# Patient Record
Sex: Female | Born: 1975 | Race: White | Hispanic: No | Marital: Married | Smoking: Never smoker
Health system: Southern US, Community
[De-identification: ages and names within clinical notes are randomized; demographics above are authoritative.]

## PROBLEM LIST (undated history)

## (undated) DIAGNOSIS — R1115 Cyclical vomiting syndrome unrelated to migraine: Secondary | ICD-10-CM

## (undated) DIAGNOSIS — K219 Gastro-esophageal reflux disease without esophagitis: Secondary | ICD-10-CM

## (undated) DIAGNOSIS — I10 Essential (primary) hypertension: Secondary | ICD-10-CM

## (undated) DIAGNOSIS — G4730 Sleep apnea, unspecified: Secondary | ICD-10-CM

## (undated) DIAGNOSIS — T7840XA Allergy, unspecified, initial encounter: Secondary | ICD-10-CM

## (undated) DIAGNOSIS — D649 Anemia, unspecified: Secondary | ICD-10-CM

## (undated) DIAGNOSIS — E785 Hyperlipidemia, unspecified: Secondary | ICD-10-CM

## (undated) DIAGNOSIS — U071 COVID-19: Secondary | ICD-10-CM

## (undated) HISTORY — DX: Anemia, unspecified: D64.9

## (undated) HISTORY — DX: Allergy, unspecified, initial encounter: T78.40XA

## (undated) HISTORY — DX: Essential (primary) hypertension: I10

## (undated) HISTORY — DX: Sleep apnea, unspecified: G47.30

## (undated) HISTORY — PX: TUBAL LIGATION: SHX77

## (undated) HISTORY — DX: Hyperlipidemia, unspecified: E78.5

## (undated) HISTORY — DX: Gastro-esophageal reflux disease without esophagitis: K21.9

## (undated) HISTORY — PX: CHOLECYSTECTOMY: SHX55

---

## 2019-11-20 ENCOUNTER — Emergency Department (HOSPITAL_COMMUNITY): Payer: 59

## 2019-11-20 ENCOUNTER — Inpatient Hospital Stay (HOSPITAL_COMMUNITY)
Admission: EM | Admit: 2019-11-20 | Discharge: 2019-11-23 | DRG: 177 | Disposition: A | Discharge status: Home / Self Care | Payer: 59 | Attending: Internal Medicine | Admitting: Internal Medicine

## 2019-11-20 DIAGNOSIS — U071 COVID-19: Principal | ICD-10-CM | POA: Diagnosis present

## 2019-11-20 DIAGNOSIS — D649 Anemia, unspecified: Secondary | ICD-10-CM | POA: Diagnosis present

## 2019-11-20 DIAGNOSIS — Z9049 Acquired absence of other specified parts of digestive tract: Secondary | ICD-10-CM

## 2019-11-20 DIAGNOSIS — D72819 Decreased white blood cell count, unspecified: Secondary | ICD-10-CM | POA: Diagnosis present

## 2019-11-20 DIAGNOSIS — R55 Syncope and collapse: Secondary | ICD-10-CM | POA: Diagnosis not present

## 2019-11-20 DIAGNOSIS — E876 Hypokalemia: Secondary | ICD-10-CM | POA: Diagnosis present

## 2019-11-20 DIAGNOSIS — R112 Nausea with vomiting, unspecified: Secondary | ICD-10-CM

## 2019-11-20 DIAGNOSIS — J1282 Pneumonia due to coronavirus disease 2019: Secondary | ICD-10-CM | POA: Diagnosis present

## 2019-11-20 DIAGNOSIS — E860 Dehydration: Secondary | ICD-10-CM | POA: Diagnosis present

## 2019-11-20 LAB — CBC WITH DIFFERENTIAL/PLATELET
Abs Immature Granulocytes: 0.01 10*3/uL (ref 0.00–0.07)
Basophils Absolute: 0 10*3/uL (ref 0.0–0.1)
Basophils Relative: 0 %
Eosinophils Absolute: 0 10*3/uL (ref 0.0–0.5)
Eosinophils Relative: 0 %
HCT: 36.7 % (ref 36.0–46.0)
Hemoglobin: 11.8 g/dL — ABNORMAL LOW (ref 12.0–15.0)
Immature Granulocytes: 0 %
Lymphocytes Relative: 20 %
Lymphs Abs: 0.6 10*3/uL — ABNORMAL LOW (ref 0.7–4.0)
MCH: 25.4 pg — ABNORMAL LOW (ref 26.0–34.0)
MCHC: 32.2 g/dL (ref 30.0–36.0)
MCV: 78.9 fL — ABNORMAL LOW (ref 80.0–100.0)
Monocytes Absolute: 0.1 10*3/uL (ref 0.1–1.0)
Monocytes Relative: 4 %
Neutro Abs: 2.2 10*3/uL (ref 1.7–7.7)
Neutrophils Relative %: 76 %
Platelets: 161 10*3/uL (ref 150–400)
RBC: 4.65 MIL/uL (ref 3.87–5.11)
RDW: 15 % (ref 11.5–15.5)
WBC: 2.9 10*3/uL — ABNORMAL LOW (ref 4.0–10.5)
nRBC: 0 % (ref 0.0–0.2)

## 2019-11-20 LAB — RESPIRATORY PANEL BY RT PCR (FLU A&B, COVID)
Influenza A by PCR: NEGATIVE
Influenza B by PCR: NEGATIVE
SARS Coronavirus 2 by RT PCR: POSITIVE — AB

## 2019-11-20 LAB — COMPREHENSIVE METABOLIC PANEL
ALT: 97 U/L — ABNORMAL HIGH (ref 0–44)
AST: 101 U/L — ABNORMAL HIGH (ref 15–41)
Albumin: 3.4 g/dL — ABNORMAL LOW (ref 3.5–5.0)
Alkaline Phosphatase: 89 U/L (ref 38–126)
Anion gap: 11 (ref 5–15)
BUN: 10 mg/dL (ref 6–20)
CO2: 25 mmol/L (ref 22–32)
Calcium: 8.7 mg/dL — ABNORMAL LOW (ref 8.9–10.3)
Chloride: 104 mmol/L (ref 98–111)
Creatinine, Ser: 0.69 mg/dL (ref 0.44–1.00)
GFR calc Af Amer: >60 mL/min (ref >60)
GFR calc non Af Amer: >60 mL/min (ref >60)
Glucose, Bld: 122 mg/dL — ABNORMAL HIGH (ref 70–99)
Potassium: 3.2 mmol/L — ABNORMAL LOW (ref 3.5–5.1)
Sodium: 140 mmol/L (ref 135–145)
Total Bilirubin: 0.9 mg/dL (ref 0.3–1.2)
Total Protein: 6.9 g/dL (ref 6.5–8.1)

## 2019-11-20 MED ORDER — SODIUM CHLORIDE 0.9 % IV SOLN: INTRAVENOUS | Status: DC

## 2019-11-20 MED ORDER — ONDANSETRON HCL 4 MG/2ML IJ SOLN
4.0000 mg | Freq: Once | INTRAMUSCULAR | Status: AC
  Administered 2019-11-20: 4 mg via INTRAVENOUS
  Filled 2019-11-20: qty 2

## 2019-11-20 NOTE — ED Provider Notes (Signed)
[facility] EMERGENCY DEPARTMENT Provider Note   CSN: [id number] Arrival date & time: 11/20/19  2205     History Chief Complaint  Patient presents with  . Covid Positive    Judith Bentley is a 44 y.o. female.  HPI   This patient is a 44 year old female, arrives by paramedic transport ill-appearing after having a unwitnessed loss of consciousness at her house.  According to the paramedics the husband had reported that the patient was found unresponsive on the floor the bathroom, it is unclear exactly how long she was there, she cannot give me any other history as she is severely diffusely weak with altered mental status.  The patient reportedly had called out for her husband who was able to hear her and found her lethargic after being diagnosed with COVID-19 on Wednesday, approximately 48 hours ago.  Her symptoms have been primarily gastrointestinal with nausea and vomiting and has been developing some lower abdominal discomfort, there has been some coughing but not as much as the gastrointestinal symptoms.  It is unclear whether the patient was vaccinated, I do not have contact information for the husband at this time.  Level 5 caveat applies secondary to the lethargic nature of this patient and inability to give a clear history.  Paramedics to report that they had some transient hypoxia requiring 3 L by nasal cannula prehospital.  Husband has Covid as well.  Husband got sick 9 days ago - was in bed for days - She has been sick since Monday (5 days), she was fatigued / no fevers.  She was having chills.  She has chronic nausea (on phenergan),   No past medical history on file.  There are no problems to display for this patient. Husband states she takes medicine for GERD  - Nexium. Taking 40mg  daily. She sees a GI doctor in [place].  No alcohol No tobacco GB has been taken out. Endoscopy showed nothing diagnostic. She has passed out with the vomiting in the  past. She can usually sleep it off.   The histories are not reviewed yet. Please review them in the "History" navigator section and refresh this SmartLink.   OB History   No obstetric history on file.     No family history on file.  Social History   Tobacco Use  . Smoking status: Not on file  Substance Use Topics  . Alcohol use: Not on file  . Drug use: Not on file    Home Medications Prior to Admission medications   Not on File    Allergies    Patient has no allergy information on record.  Review of Systems   Review of Systems  Unable to perform ROS: Mental status change    Physical Exam Updated Vital Signs BP 120/73 (BP Location: Left Arm)   Pulse 98   Temp 97.7 F (36.5 C) (Oral)   Resp 11   SpO2 93%   Physical Exam Vitals and nursing note reviewed.  Constitutional:      General: She is in acute distress.     Appearance: She is well-developed. She is ill-appearing and diaphoretic.  HENT:     Head: Normocephalic and atraumatic.     Mouth/Throat:     Pharynx: No oropharyngeal exudate.  Eyes:     General: No scleral icterus.       Right eye: No discharge.        Left eye: No discharge.     Conjunctiva/sclera: Conjunctivae normal.  Pupils: Pupils are equal, round, and reactive to light.  Neck:     Thyroid: No thyromegaly.     Vascular: No JVD.  Cardiovascular:     Rate and Rhythm: Normal rate and regular rhythm.     Heart sounds: Normal heart sounds. No murmur heard.  No friction rub. No gallop.   Pulmonary:     Effort: Pulmonary effort is normal. No respiratory distress.     Breath sounds: Normal breath sounds. No wheezing or rales.  Abdominal:     General: Bowel sounds are normal. There is no distension.     Palpations: Abdomen is soft. There is no mass.     Tenderness: There is abdominal tenderness.     Comments: There is tenderness in the lower abdomen and left lower quadrant but very soft nonperitoneal with no guarding  Musculoskeletal:         General: No tenderness. Normal range of motion.     Cervical back: Normal range of motion and neck supple.  Lymphadenopathy:     Cervical: No cervical adenopathy.  Skin:    General: Skin is warm.     Findings: No erythema or rash.  Neurological:     Mental Status: She is alert.     Coordination: Coordination normal.  Psychiatric:        Behavior: Behavior normal.     ED Results / Procedures / Treatments   Labs (all labs ordered are listed, but only abnormal results are displayed) Labs Reviewed  RESPIRATORY PANEL BY RT PCR (FLU A&B, COVID)  CBC WITH DIFFERENTIAL/PLATELET  COMPREHENSIVE METABOLIC PANEL  URINALYSIS, ROUTINE W REFLEX MICROSCOPIC    EKG None  Radiology No results found.  Procedures Procedures (including critical care time)  Medications Ordered in ED Medications  ondansetron (ZOFRAN) injection 4 mg (has no administration in time range)  0.9 %  sodium chloride infusion (has no administration in time range)    ED Course  I have reviewed the triage vital signs and the nursing notes.  Pertinent labs & imaging results that were available during my care of the patient were reviewed by me and considered in my medical decision making (see chart for details).    MDM Rules/Calculators/A&P                          This patient is so diffusely weak that she needs 2 people to help her even sit up in the bed.  She has some very strange eye flickering movements but seems to be awake and trying to follow commands during this timeframe.  When I asked simple yes/no questions she is able to nod or shake her head but really does not talk at all.  She tries to whisper certain things and when I ask you to call she whispers my husband.  She cannot give me his contact information and it is not listed in the demographics section.  EKG performed on November 20, 2019 at 10:14 PM shows borderline sinus tachycardia with a normal axis, normal intervals, normal ST segments, no  signs of left ventricular hypertrophy, if anything there is some low voltage.  We will proceed with evaluation of this patient with regards to gastrointestinal symptoms, a chest x-ray, rule out urinary tract infection or other intra-abdominal pathology that may be causing her symptoms.  The patient is altered, needs further history and stabilizing care - Covid 19  precautions maintained in the room.  Husband contacted by phone -  she has had Covid 19 - having "problems with her stomach" = had endoscopy a month ago due to chronic abd problems.  She gets to vomiting, the last time she was in the hospital - she had a "cocktail" to help her-  Taking mylanta - then developed a fever - stomach was hurting tonight - husband her her scream "help me", was not breathing - he gave her mouth  See above notes for husbands report. Specifially, this patient has had syncopal / weak episodes episodically with the n/v in the past.  Husbands number is [phone]  At change of shift - care signed out to Dr. Joycelyn Das was evaluated in Emergency Department on 11/20/2019 for the symptoms described in the history of present illness. She was evaluated in the context of the global COVID-19 pandemic, which necessitated consideration that the patient might be at risk for infection with the SARS-CoV-2 virus that causes COVID-19. Institutional protocols and algorithms that pertain to the evaluation of patients at risk for COVID-19 are in a state of rapid change based on information released by regulatory bodies including the CDC and federal and state organizations. These policies and algorithms were followed during the patient's care in the ED.   Final Clinical Impression(s) / ED Diagnoses Final diagnoses:  None    Rx / DC Orders ED Discharge Orders    None       Eber Hong, MD 11/20/19 2323

## 2019-11-20 NOTE — ED Triage Notes (Signed)
Per ems pt tested positive for covid on Wednesday. Since then she has been having worsening symptoms.pt has been c/o N/V and L Lower abdominal pain. Pt also had an unwitnessed syncopal episode per husband where he found her on the bathroom floor after calling his name.  Pt lethargic with ems and upon arrival. Patient unable to answer any questions at this time.

## 2019-11-21 ENCOUNTER — Observation Stay (HOSPITAL_COMMUNITY): Payer: 59

## 2019-11-21 ENCOUNTER — Encounter (HOSPITAL_COMMUNITY): Payer: Self-pay | Admitting: Internal Medicine

## 2019-11-21 ENCOUNTER — Other Ambulatory Visit: Payer: Self-pay

## 2019-11-21 DIAGNOSIS — R55 Syncope and collapse: Secondary | ICD-10-CM

## 2019-11-21 DIAGNOSIS — J1282 Pneumonia due to coronavirus disease 2019: Secondary | ICD-10-CM | POA: Diagnosis present

## 2019-11-21 DIAGNOSIS — D649 Anemia, unspecified: Secondary | ICD-10-CM | POA: Diagnosis present

## 2019-11-21 DIAGNOSIS — U071 COVID-19: Secondary | ICD-10-CM | POA: Diagnosis present

## 2019-11-21 DIAGNOSIS — Z9049 Acquired absence of other specified parts of digestive tract: Secondary | ICD-10-CM | POA: Diagnosis not present

## 2019-11-21 DIAGNOSIS — E860 Dehydration: Secondary | ICD-10-CM | POA: Diagnosis present

## 2019-11-21 DIAGNOSIS — E876 Hypokalemia: Secondary | ICD-10-CM | POA: Diagnosis present

## 2019-11-21 DIAGNOSIS — D72819 Decreased white blood cell count, unspecified: Secondary | ICD-10-CM | POA: Diagnosis present

## 2019-11-21 HISTORY — DX: COVID-19: U07.1

## 2019-11-21 HISTORY — DX: Syncope and collapse: R55

## 2019-11-21 LAB — CBC
HCT: 33.5 % — ABNORMAL LOW (ref 36.0–46.0)
Hemoglobin: 10.3 g/dL — ABNORMAL LOW (ref 12.0–15.0)
MCH: 24.6 pg — ABNORMAL LOW (ref 26.0–34.0)
MCHC: 30.7 g/dL (ref 30.0–36.0)
MCV: 80.1 fL (ref 80.0–100.0)
Platelets: 154 10*3/uL (ref 150–400)
RBC: 4.18 MIL/uL (ref 3.87–5.11)
RDW: 15.3 % (ref 11.5–15.5)
WBC: 2 10*3/uL — ABNORMAL LOW (ref 4.0–10.5)
nRBC: 0 % (ref 0.0–0.2)

## 2019-11-21 LAB — C-REACTIVE PROTEIN: CRP: 1.9 mg/dL — ABNORMAL HIGH (ref <1.0)

## 2019-11-21 LAB — BASIC METABOLIC PANEL
Anion gap: 9 (ref 5–15)
BUN: 7 mg/dL (ref 6–20)
CO2: 27 mmol/L (ref 22–32)
Calcium: 7.9 mg/dL — ABNORMAL LOW (ref 8.9–10.3)
Chloride: 107 mmol/L (ref 98–111)
Creatinine, Ser: 0.58 mg/dL (ref 0.44–1.00)
GFR calc Af Amer: >60 mL/min (ref >60)
GFR calc non Af Amer: >60 mL/min (ref >60)
Glucose, Bld: 97 mg/dL (ref 70–99)
Potassium: 3.5 mmol/L (ref 3.5–5.1)
Sodium: 143 mmol/L (ref 135–145)

## 2019-11-21 LAB — BRAIN NATRIURETIC PEPTIDE: B Natriuretic Peptide: 39.1 pg/mL (ref 0.0–100.0)

## 2019-11-21 LAB — HIV ANTIBODY (ROUTINE TESTING W REFLEX): HIV Screen 4th Generation wRfx: NONREACTIVE

## 2019-11-21 LAB — PREGNANCY, URINE: Preg Test, Ur: NEGATIVE

## 2019-11-21 LAB — CREATININE, SERUM
Creatinine, Ser: 0.62 mg/dL (ref 0.44–1.00)
GFR calc Af Amer: >60 mL/min (ref >60)
GFR calc non Af Amer: >60 mL/min (ref >60)

## 2019-11-21 LAB — URINALYSIS, ROUTINE W REFLEX MICROSCOPIC
Bilirubin Urine: NEGATIVE
Glucose, UA: NEGATIVE mg/dL
Hgb urine dipstick: NEGATIVE
Ketones, ur: NEGATIVE mg/dL
Leukocytes,Ua: NEGATIVE
Nitrite: NEGATIVE
Protein, ur: NEGATIVE mg/dL
Specific Gravity, Urine: 1.012 (ref 1.005–1.030)
pH: 5 (ref 5.0–8.0)

## 2019-11-21 LAB — MAGNESIUM: Magnesium: 2 mg/dL (ref 1.7–2.4)

## 2019-11-21 LAB — D-DIMER, QUANTITATIVE: D-Dimer, Quant: 0.61 ug/mL-FEU — ABNORMAL HIGH (ref 0.00–0.50)

## 2019-11-21 MED ORDER — SODIUM CHLORIDE 0.9 % IV BOLUS
1000.0000 mL | Freq: Once | INTRAVENOUS | Status: AC
  Administered 2019-11-21: 1000 mL via INTRAVENOUS

## 2019-11-21 MED ORDER — ACETAMINOPHEN 650 MG RE SUPP: 650.0000 mg | Freq: Four times a day (QID) | RECTAL | Status: DC | PRN

## 2019-11-21 MED ORDER — GUAIFENESIN-DM 100-10 MG/5ML PO SYRP: 10.0000 mL | ORAL_SOLUTION | ORAL | Status: DC | PRN

## 2019-11-21 MED ORDER — ACETAMINOPHEN 325 MG PO TABS
650.0000 mg | ORAL_TABLET | Freq: Four times a day (QID) | ORAL | Status: DC | PRN
  Administered 2019-11-21 – 2019-11-23 (×2): 650 mg via ORAL
  Filled 2019-11-21 (×2): qty 2

## 2019-11-21 MED ORDER — HYDROCOD POLST-CPM POLST ER 10-8 MG/5ML PO SUER: 5.0000 mL | Freq: Two times a day (BID) | ORAL | Status: DC | PRN

## 2019-11-21 MED ORDER — PROMETHAZINE HCL 25 MG/ML IJ SOLN
12.5000 mg | Freq: Once | INTRAMUSCULAR | Status: AC
  Administered 2019-11-21: 12.5 mg via INTRAVENOUS
  Filled 2019-11-21: qty 1

## 2019-11-21 MED ORDER — SODIUM CHLORIDE 0.9 % IV SOLN
200.0000 mg | Freq: Once | INTRAVENOUS | Status: AC
  Administered 2019-11-21: 200 mg via INTRAVENOUS
  Filled 2019-11-21: qty 40

## 2019-11-21 MED ORDER — ENOXAPARIN SODIUM 40 MG/0.4ML ~~LOC~~ SOLN
40.0000 mg | SUBCUTANEOUS | Status: DC
  Administered 2019-11-21 – 2019-11-23 (×3): 40 mg via SUBCUTANEOUS
  Filled 2019-11-21 (×3): qty 0.4

## 2019-11-21 MED ORDER — SODIUM CHLORIDE 0.9 % IV SOLN: INTRAVENOUS | Status: DC

## 2019-11-21 MED ORDER — ONDANSETRON HCL 4 MG/2ML IJ SOLN
4.0000 mg | Freq: Four times a day (QID) | INTRAMUSCULAR | Status: DC | PRN
  Administered 2019-11-21 – 2019-11-22 (×3): 4 mg via INTRAVENOUS
  Filled 2019-11-21 (×3): qty 2

## 2019-11-21 MED ORDER — ONDANSETRON HCL 4 MG PO TABS: 4.0000 mg | ORAL_TABLET | Freq: Four times a day (QID) | ORAL | Status: DC | PRN

## 2019-11-21 MED ORDER — POTASSIUM CHLORIDE CRYS ER 20 MEQ PO TBCR
20.0000 meq | EXTENDED_RELEASE_TABLET | Freq: Once | ORAL | Status: AC
  Administered 2019-11-21: 20 meq via ORAL
  Filled 2019-11-21: qty 1

## 2019-11-21 MED ORDER — DEXAMETHASONE SODIUM PHOSPHATE 10 MG/ML IJ SOLN
6.0000 mg | INTRAMUSCULAR | Status: DC
  Administered 2019-11-21 – 2019-11-22 (×2): 6 mg via INTRAVENOUS
  Filled 2019-11-21 (×2): qty 1

## 2019-11-21 MED ORDER — SODIUM CHLORIDE 0.9 % IV SOLN
100.0000 mg | Freq: Every day | INTRAVENOUS | Status: DC
  Administered 2019-11-22 – 2019-11-23 (×2): 100 mg via INTRAVENOUS
  Filled 2019-11-21 (×2): qty 20

## 2019-11-21 NOTE — ED Notes (Signed)
Checked oin pt she stated that she was fine but she wanted a blanket. Blanket provided

## 2019-11-21 NOTE — Plan of Care (Signed)

## 2019-11-21 NOTE — ED Notes (Signed)
Arlys John, husband, [phone] would like an update when available

## 2019-11-21 NOTE — ED Provider Notes (Signed)
Care assumed from Dr. Hyacinth Meeker at shift change.  Patient brought here following an apparent syncopal episode.  Patient diagnosed with COVID-19 5 days ago and has been feeling poorly.  This evening, she began vomiting, then had what sounds like a syncopal spell.  He was then brought here by ambulance.  I assumed care awaiting results of patient's laboratory studies and response to antiemetic medications.  When she was rechecked, she was actively vomiting and appeared uncomfortable.  Oxygen saturations in the low 90s while off oxygen.  Her chest x-ray does show findings of possible developing pneumonia.  Given the overall clinical picture, I feel as though admission is warranted.  I have spoken with Dr. Toniann Fail who will evaluate and admit.   Geoffery Lyons, MD 11/21/19 [phone]

## 2019-11-21 NOTE — ED Notes (Signed)
Pt at 89-90 moved up to 3.5 L

## 2019-11-21 NOTE — ED Notes (Signed)
Pt stated she was hot, turned down air in room, Vitals looked same

## 2019-11-21 NOTE — ED Notes (Signed)
Pt husband updated on plan of care and pt condition Pt is lethargic and but oriented. Does not appear in distress, respirations are shallow and even.  Skins is warm, dry and intact.

## 2019-11-21 NOTE — ED Notes (Signed)
Checked on Pt and she stated that the blanket helped but she was still a little cold. I explianed to her that chills are part of covid and I would continue to keep a check on her. She stated that she was fine with that

## 2019-11-21 NOTE — Progress Notes (Addendum)
PROGRESS NOTE                                                                                                                                                                                                             Patient Demographics:    Judith BjorkStephanie Bentley, is a 44 y.o. female, DOB - 1975/08/14, ZOX:[id number]  Outpatient Primary MD for the patient is Patient, No Pcp Per    LOS - 0  Admit date - 11/20/2019    Chief Complaint  Patient presents with  . Covid Positive       Brief Narrative - Judith BjorkStephanie Bentley is a 44 y.o. female with no significant past medical history was brought to the ER after patient had lost consciousness.  Patient states about 5 days ago patient started developing headache fever chills upper respiratory tract symptoms with nausea vomiting.  She was diagnosed with Covid PNA and admitted.   Subjective:    Judith BjorkStephanie Bentley today has, No headache, No chest pain, No abdominal pain - No Nausea, No new weakness tingling or numbness, mild Cough - SOB.     Assessment  & Plan :     1.  Acute Covid 19 Viral Pneumonitis during the ongoing 2020 Covid 19 Pandemic -she is unfortunately unvaccinated and so far appears to have mild parenchymal lung injury, will be started on combination of steroids and Remdesivir.  Monitor closely.  She has consented for Actemra/Baricitinib use if she gets worse.  Encouraged the patient to sit up in chair in the daytime use I-S and flutter valve for pulmonary toiletry and then prone in bed when at night.  Will advance activity and titrate down oxygen as possible.  Actemra/Baricitinib  off label use - patient was told that if COVID-19 pneumonitis gets worse we might potentially use Actemra off label, patient denies any known history of active diverticulitis, tuberculosis or hepatitis, understands the risks and benefits and wants to proceed with Actemra treatment if required.      SpO2: 95 %  Recent Labs  Lab 11/20/19 2222 11/20/19 2228 11/21/19 0615 11/21/19 0856  WBC 2.9*  --  2.0*  --   PLT 161  --  154  --   CRP  --   --  1.9*  --   BNP  --   --   --  39.1  DDIMER  --   --  0.61*  --   AST 101*  --   --   --   ALT 97*  --   --   --   ALKPHOS 89  --   --   --   BILITOT 0.9  --   --   --   ALBUMIN 3.4*  --   --   --   SARSCOV2NAA  --  POSITIVE*  --   --     2.  Generalized body aches and some intermittent flank pain.  CT scan head,  abdomen - pelvis non acute, await UA, monitor with IVF and supportive care.   3.  Mild asymptomatic transaminitis due to viral infection.  Trend.  4.  Hypokalemia.  Replaced and stable.  5.  Mild leukopenia likely due to viral illness.  Monitor with supportive care.  Repeat counts in the morning.  6.  Dehydration induced syncope.  Stabilized, symptom-free, head CT unremarkable, no focal deficits, hydrate with IV fluids and monitor with PT.    Condition - Fair  Family Communication  :  None  Code Status :  Full  Consults  :  None  Procedures  :    CT Head and ADB -Pelvis - Non acute  PUD Prophylaxis : None  Disposition Plan  :    Status is: Inpt  Dispo: The patient is from: Home              Anticipated d/c is to: Home              Anticipated d/c date is: 3 days              Patient currently is not medically stable to d/c.   DVT Prophylaxis  :  Lovenox    Lab Results  Component Value Date   PLT 154 11/21/2019    Diet :  Diet Order            Diet regular Room service appropriate? Yes; Fluid consistency: Thin  Diet effective now                  Inpatient Medications  Scheduled Meds: . dexamethasone (DECADRON) injection  6 mg Intravenous Q24H  . enoxaparin (LOVENOX) injection  40 mg Subcutaneous Q24H   Continuous Infusions: . sodium chloride 125 mL/hr at 11/21/19 0553   PRN Meds:.acetaminophen **OR** [DISCONTINUED] acetaminophen, chlorpheniramine-HYDROcodone,  guaiFENesin-dextromethorphan, [DISCONTINUED] ondansetron **OR** ondansetron (ZOFRAN) IV  Antibiotics  :    Anti-infectives (From admission, onward)   None       Time Spent in minutes  30   Susa Raring M.D on 11/21/2019 at 12:02 PM  To page go to [email or web address] - password [facility]  [facility] -  Office  [phone]    See all Orders from today for further details    Objective:   Vitals:   11/21/19 1015 11/21/19 1030 11/21/19 1045 11/21/19 1115  BP: 126/85 133/86 135/83 122/72  Pulse: 85 89 94 (!) 108  Resp: 16 15 15  (!) 30  Temp:      TempSrc:      SpO2:        Wt Readings from Last 3 Encounters:  No data found for Wt     Intake/Output Summary (Last 24 hours) at 11/21/2019 1202 Last data filed at 11/21/2019 0503 Gross per 24 hour  Intake 1000 ml  Output 450 ml  Net 550 ml     Physical Exam  Awake Alert,  No new F.N deficits, Normal affect [place].AT,PERRAL Supple Neck,No JVD, No cervical lymphadenopathy appriciated.  Symmetrical Chest wall movement, Good air movement bilaterally, CTAB RRR,No Gallops,Rubs or new Murmurs, No Parasternal Heave +ve B.Sounds, Abd Soft, No tenderness, No organomegaly appriciated, No rebound - guarding or rigidity. No Cyanosis, Clubbing or edema, No new Rash or bruise       Data Review:    CBC Recent Labs  Lab 11/20/19 2222 11/21/19 0615  WBC 2.9* 2.0*  HGB 11.8* 10.3*  HCT 36.7 33.5*  PLT 161 154  MCV 78.9* 80.1  MCH 25.4* 24.6*  MCHC 32.2 30.7  RDW 15.0 15.3  LYMPHSABS 0.6*  --   MONOABS 0.1  --   EOSABS 0.0  --   BASOSABS 0.0  --     Recent Labs  Lab 11/20/19 2222 11/21/19 0615 11/21/19 0856  NA 140  --  143  K 3.2*  --  3.5  CL 104  --  107  CO2 25  --  27  GLUCOSE 122*  --  97  BUN 10  --  7  CREATININE 0.69 0.62 0.58  CALCIUM 8.7*  --  7.9*  AST 101*  --   --   ALT 97*  --   --   ALKPHOS 89  --   --   BILITOT 0.9  --   --   ALBUMIN 3.4*  --   --   MG  --   --  2.0  CRP  --  1.9*  --     DDIMER  --  0.61*  --   BNP  --   --  39.1    ------------------------------------------------------------------------------------------------------------------ No results for input(s): CHOL, HDL, LDLCALC, TRIG, CHOLHDL, LDLDIRECT in the last 72 hours.  No results found for: HGBA1C ------------------------------------------------------------------------------------------------------------------ No results for input(s): TSH, T4TOTAL, T3FREE, THYROIDAB in the last 72 hours.  Invalid input(s): FREET3  Cardiac Enzymes No results for input(s): CKMB, TROPONINI, MYOGLOBIN in the last 168 hours.  Invalid input(s): CK ------------------------------------------------------------------------------------------------------------------    Component Value Date/Time   BNP 39.1 11/21/2019 0856    Micro Results Recent Results (from the past 240 hour(s))  Respiratory Panel by RT PCR (Flu A&B, Covid) - Nasopharyngeal Swab     Status: Abnormal   Collection Time: 11/20/19 10:28 PM   Specimen: Nasopharyngeal Swab  Result Value Ref Range Status   SARS Coronavirus 2 by RT PCR POSITIVE (A) NEGATIVE Final    Comment: RESULT CALLED TO, READ BACK BY AND VERIFIED WITH: Alyse Low RN 11/20/19 AT 2352 SK (NOTE) SARS-CoV-2 target nucleic acids are DETECTED.  SARS-CoV-2 RNA is generally detectable in upper respiratory specimens  during the acute phase of infection. Positive results are indicative of the presence of the identified virus, but do not rule out bacterial infection or co-infection with other pathogens not detected by the test. Clinical correlation with patient history and other diagnostic information is necessary to determine patient infection status. The expected result is Negative.  Fact Sheet for Patients:  https://www.moore.com/  Fact Sheet for Healthcare Providers: https://www.young.biz/  This test is not yet approved or cleared by the [place] FDA and  has been authorized for detection and/or diagnosis of SARS-CoV-2 by FDA under an Emergency Use Authorization (EUA).  This EUA will remain in effect (meaning this test can be use d) for the duration of  the COVID-19 declaration under Section 564(b)(1) of the Act, 21 U.S.C. section 360bbb-3(b)(1), unless the authorization is terminated or revoked sooner.  Influenza A by PCR NEGATIVE NEGATIVE Final   Influenza B by PCR NEGATIVE NEGATIVE Final    Comment: (NOTE) The Xpert Xpress SARS-CoV-2/FLU/RSV assay is intended as an aid in  the diagnosis of influenza from Nasopharyngeal swab specimens and  should not be used as a sole basis for treatment. Nasal washings and  aspirates are unacceptable for Xpert Xpress SARS-CoV-2/FLU/RSV  testing.  Fact Sheet for Patients: https://www.moore.com/  Fact Sheet for Healthcare Providers: https://www.young.biz/  This test is not yet approved or cleared by the [place] FDA and  has been authorized for detection and/or diagnosis of SARS-CoV-2 by  FDA under an Emergency Use Authorization (EUA). This EUA will remain  in effect (meaning this test can be used) for the duration of the  Covid-19 declaration under Section 564(b)(1) of the Act, 21  U.S.C. section 360bbb-3(b)(1), unless the authorization is  terminated or revoked. Performed at [facility], [address], [place], [place] [address]     Radiology Reports CT HEAD WO CONTRAST  Result Date: 11/21/2019 CLINICAL DATA:  Mental status change.  No reported injury. EXAM: CT HEAD WITHOUT CONTRAST TECHNIQUE: Contiguous axial images were obtained from the base of the skull through the vertex without intravenous contrast. COMPARISON:  10/05/2010 head CT. FINDINGS: Brain: No evidence of parenchymal hemorrhage or extra-axial fluid collection. No mass lesion, mass effect, or midline shift. No CT evidence of acute infarction. Cerebral volume  is age appropriate. No ventriculomegaly. Vascular: No acute abnormality. Skull: No evidence of calvarial fracture. Sinuses/Orbits: No fluid levels. Mild mucoperiosteal thickening throughout the visualized paranasal sinuses bilaterally. Other:  The mastoid air cells are unopacified. IMPRESSION: 1. No evidence of acute intracranial abnormality. 2. Mild chronic appearing paranasal sinusitis. Electronically Signed   By: Delbert Phenix M.D.   On: 11/21/2019 05:09   DG Chest Port 1 View  Result Date: 11/20/2019 CLINICAL DATA:  Cough COVID EXAM: PORTABLE CHEST 1 VIEW COMPARISON:  None. FINDINGS: Patchy basilar airspace disease. No pleural effusion. Normal heart size. No pneumothorax. IMPRESSION: Patchy basilar airspace disease suspicious for pneumonia. Electronically Signed   By: Jasmine Pang M.D.   On: 11/20/2019 22:40   CT RENAL STONE STUDY  Result Date: 11/21/2019 CLINICAL DATA:  Left lower abdominal pain, nausea and vomiting. COVID positive. Syncopal episode. EXAM: CT ABDOMEN AND PELVIS WITHOUT CONTRAST TECHNIQUE: Multidetector CT imaging of the abdomen and pelvis was performed following the standard protocol without IV contrast. COMPARISON:  09/07/2019 CT chest, abdomen and pelvis. FINDINGS: Lower chest: Patchy ground-glass opacity and peripheral consolidation at both lung bases, new. Hepatobiliary: Normal liver size. No liver mass. Cholecystectomy. No biliary ductal dilatation. Pancreas: Normal, with no mass or duct dilation. Spleen: Normal size. No mass. Adrenals/Urinary Tract: Normal adrenals. No renal stones. No hydronephrosis. No contour deforming renal masses. Normal bladder. Stomach/Bowel: Normal non-distended stomach. Normal caliber small bowel with no small bowel wall thickening. Normal appendix. Normal large bowel with no diverticulosis, large bowel wall thickening or pericolonic fat stranding. Vascular/Lymphatic: Normal caliber abdominal aorta. No pathologically enlarged lymph nodes in the abdomen or  pelvis. Reproductive: Grossly normal uterus.  No adnexal mass. Other: No pneumoperitoneum, ascites or focal fluid collection. Small fat containing umbilical hernia. Musculoskeletal: No aggressive appearing focal osseous lesions. Mild thoracolumbar spondylosis. IMPRESSION: 1. Patchy ground-glass opacity and peripheral consolidation at both lung bases, new, compatible with COVID-19 pneumonia. 2. No acute abnormality in the abdomen or pelvis. No evidence of bowel obstruction or acute bowel inflammation. Normal appendix. 3. Small fat containing umbilical  hernia. Electronically Signed   By: Delbert Phenix M.D.   On: 11/21/2019 08:20

## 2019-11-21 NOTE — H&P (Signed)
History and Physical    Judith Bentley HYW:[id number] DOB: 1975-07-26 DOA: 11/20/2019  PCP: Patient, No Pcp Per  Patient coming from: Home.  Chief Complaint: Loss of consciousness.  HPI: Judith Bentley is a 44 y.o. female with no significant past medical history was brought to the ER after patient had lost consciousness.  Patient states about 5 days ago patient started developing headache fever chills upper respiratory tract symptoms with nausea vomiting.  Since then patient has been having persistent weakness and dizziness.  Patient's husband was diagnosed with COVID-19 infection about 3 days ago.  Denies any diarrhea but has been having some left flank pain.  Patient states she was going to the bathroom and was on the commode when she lost consciousness.  Patient feels that she may have lost consciousness only for few minutes.  Denies chest pain or shortness of breath.  ED Course: In the ER patient appeared generally weak but was not hypotensive or febrile or hypoxic.  Chest x-ray shows bilateral infiltrates.  Labs show elevated AST and ALT and potassium was 3.2.  Hemoglobin 11.8 WBC 2.9 inflammatory markers are pending Covid test is positive.  CT head is unremarkable EKG shows normal sinus rhythm.  UA is pending.  Patient was started on fluids and admitted for further observation.  Review of Systems: As per HPI, rest all negative.   History reviewed. No pertinent past medical history.  Past Surgical History:  Procedure Laterality Date  . CESAREAN SECTION    . CHOLECYSTECTOMY       reports that she has never smoked. She has never used smokeless tobacco. No history on file for alcohol use and drug use.  Not on File  Family History  Problem Relation Age of Onset  . Diabetes Mellitus II Maternal Grandfather     Prior to Admission medications   Not on File    Physical Exam: Constitutional: Moderately built and nourished. Vitals:   11/21/19 0345 11/21/19 0400 11/21/19  0430 11/21/19 0445  BP: 123/88 125/90 129/89 131/89  Pulse: 89 89 91 91  Resp: (!) 21 16 13 15   Temp:  97.7 F (36.5 C)    TempSrc:  Oral    SpO2:       Eyes: Anicteric no pallor. ENMT: No discharge from the ears eyes nose or mouth. Neck: No mass felt.  No neck rigidity. Respiratory: No rhonchi or crepitations. Cardiovascular: S1-S2 heard. Abdomen: Soft mild tenderness in left flank.  No guarding or rigidity. Musculoskeletal: No edema. Skin: No rash. Neurologic: Alert awake oriented to time place and person.  Moves all extremities. Psychiatric: Appears normal.  Normal affect.   Labs on Admission: I have personally reviewed following labs and imaging studies  CBC: Recent Labs  Lab 11/20/19 2222  WBC 2.9*  NEUTROABS 2.2  HGB 11.8*  HCT 36.7  MCV 78.9*  PLT 161   Basic Metabolic Panel: Recent Labs  Lab 11/20/19 2222  NA 140  K 3.2*  CL 104  CO2 25  GLUCOSE 122*  BUN 10  CREATININE 0.69  CALCIUM 8.7*   GFR: CrCl cannot be calculated (Unknown ideal weight.). Liver Function Tests: Recent Labs  Lab 11/20/19 2222  AST 101*  ALT 97*  ALKPHOS 89  BILITOT 0.9  PROT 6.9  ALBUMIN 3.4*   No results for input(s): LIPASE, AMYLASE in the last 168 hours. No results for input(s): AMMONIA in the last 168 hours. Coagulation Profile: No results for input(s): INR, PROTIME in the last 168 hours. Cardiac  Enzymes: No results for input(s): CKTOTAL, CKMB, CKMBINDEX, TROPONINI in the last 168 hours. BNP (last 3 results) No results for input(s): PROBNP in the last 8760 hours. HbA1C: No results for input(s): HGBA1C in the last 72 hours. CBG: No results for input(s): GLUCAP in the last 168 hours. Lipid Profile: No results for input(s): CHOL, HDL, LDLCALC, TRIG, CHOLHDL, LDLDIRECT in the last 72 hours. Thyroid Function Tests: No results for input(s): TSH, T4TOTAL, FREET4, T3FREE, THYROIDAB in the last 72 hours. Anemia Panel: No results for input(s): VITAMINB12, FOLATE,  FERRITIN, TIBC, IRON, RETICCTPCT in the last 72 hours. Urine analysis: No results found for: COLORURINE, APPEARANCEUR, LABSPEC, PHURINE, GLUCOSEU, HGBUR, BILIRUBINUR, KETONESUR, PROTEINUR, UROBILINOGEN, NITRITE, LEUKOCYTESUR Sepsis Labs: @LABRCNTIP (procalcitonin:4,lacticidven:4) ) Recent Results (from the past 240 hour(s))  Respiratory Panel by RT PCR (Flu A&B, Covid) - Nasopharyngeal Swab     Status: Abnormal   Collection Time: 11/20/19 10:28 PM   Specimen: Nasopharyngeal Swab  Result Value Ref Range Status   SARS Coronavirus 2 by RT PCR POSITIVE (A) NEGATIVE Final    Comment: RESULT CALLED TO, READ BACK BY AND VERIFIED WITH: 01/20/20 RN 11/20/19 AT 2352 SK (NOTE) SARS-CoV-2 target nucleic acids are DETECTED.  SARS-CoV-2 RNA is generally detectable in upper respiratory specimens  during the acute phase of infection. Positive results are indicative of the presence of the identified virus, but do not rule out bacterial infection or co-infection with other pathogens not detected by the test. Clinical correlation with patient history and other diagnostic information is necessary to determine patient infection status. The expected result is Negative.  Fact Sheet for Patients:  01/20/20  Fact Sheet for Healthcare Providers: https://www.moore.com/  This test is not yet approved or cleared by the https://www.young.biz/ FDA and  has been authorized for detection and/or diagnosis of SARS-CoV-2 by FDA under an Emergency Use Authorization (EUA).  This EUA will remain in effect (meaning this test can be use d) for the duration of  the COVID-19 declaration under Section 564(b)(1) of the Act, 21 U.S.C. section 360bbb-3(b)(1), unless the authorization is terminated or revoked sooner.      Influenza A by PCR NEGATIVE NEGATIVE Final   Influenza B by PCR NEGATIVE NEGATIVE Final    Comment: (NOTE) The Xpert Xpress SARS-CoV-2/FLU/RSV assay is  intended as an aid in  the diagnosis of influenza from Nasopharyngeal swab specimens and  should not be used as a sole basis for treatment. Nasal washings and  aspirates are unacceptable for Xpert Xpress SARS-CoV-2/FLU/RSV  testing.  Fact Sheet for Patients: [place]  Fact Sheet for Healthcare Providers: https://www.moore.com/  This test is not yet approved or cleared by the https://www.young.biz/ FDA and  has been authorized for detection and/or diagnosis of SARS-CoV-2 by  FDA under an Emergency Use Authorization (EUA). This EUA will remain  in effect (meaning this test can be used) for the duration of the  Covid-19 declaration under Section 564(b)(1) of the Act, 21  U.S.C. section 360bbb-3(b)(1), unless the authorization is  terminated or revoked. Performed at [facility], [address], [place], [place]      Radiological Exams on Admission: CT HEAD WO CONTRAST  Result Date: 11/21/2019 CLINICAL DATA:  Mental status change.  No reported injury. EXAM: CT HEAD WITHOUT CONTRAST TECHNIQUE: Contiguous axial images were obtained from the base of the skull through the vertex without intravenous contrast. COMPARISON:  10/05/2010 head CT. FINDINGS: Brain: No evidence of parenchymal hemorrhage or extra-axial fluid collection. No mass lesion,  mass effect, or midline shift. No CT evidence of acute infarction. Cerebral volume is age appropriate. No ventriculomegaly. Vascular: No acute abnormality. Skull: No evidence of calvarial fracture. Sinuses/Orbits: No fluid levels. Mild mucoperiosteal thickening throughout the visualized paranasal sinuses bilaterally. Other:  The mastoid air cells are unopacified. IMPRESSION: 1. No evidence of acute intracranial abnormality. 2. Mild chronic appearing paranasal sinusitis. Electronically Signed   By: Delbert Phenix M.D.   On: 11/21/2019 05:09   DG Chest Port 1 View  Result Date: 11/20/2019 CLINICAL  DATA:  Cough COVID EXAM: PORTABLE CHEST 1 VIEW COMPARISON:  None. FINDINGS: Patchy basilar airspace disease. No pleural effusion. Normal heart size. No pneumothorax. IMPRESSION: Patchy basilar airspace disease suspicious for pneumonia. Electronically Signed   By: Jasmine Pang M.D.   On: 11/20/2019 22:40    EKG: Independently reviewed.  Normal sinus rhythm.  Assessment/Plan Principal Problem:   Syncope Active Problems:   Anemia   Pneumonia due to COVID-19 virus    1. Syncope likely from dehydration.  Patient is on fluids.  Closely monitor in telemetry.  Denies any chest pain or shortness of breath. 2. COVID-19 pneumonia patient is presently not hypoxic.  We will continue to monitor closely.  Check inflammatory markers. 3. Elevated LFTs likely from Covid infection.  Follow LFTs closely. 4. Left flank pain cause not clear mildly tender on exam.  Will check CT abdomen and pelvis and also check UA. 5. Nausea vomiting likely from Covid infection gently hydrate advance diet as tolerated. 6. Mild hypokalemia replace and recheck. 7. Ascitic normochromic anemia with mild leukopenia follow CBC.   DVT prophylaxis: Lovenox. Code Status: Full code. Family Communication: Discussed with patient. Disposition Plan: Home. Consults called: None. Admission status: Observation.   Eduard Clos MD [facility] Pager [phone].  If 7PM-7AM, please contact night-coverage [email or web address] Password [facility]  11/21/2019, 5:53 AM

## 2019-11-21 NOTE — Progress Notes (Signed)
Patient arrived to unit. Patient is on 1L [place] at 95%, eating her dinner. Room orientation provided, no additional questions. Patient is SOB but working on coughing and deep breathing. Purewick in use. Patient is stable and AO4.  Will continue to monitor.

## 2019-11-21 NOTE — ED Notes (Signed)
Moved back to 2 L [place] was at 88%

## 2019-11-22 ENCOUNTER — Inpatient Hospital Stay (HOSPITAL_COMMUNITY): Payer: 59

## 2019-11-22 LAB — CBC WITH DIFFERENTIAL/PLATELET
Abs Immature Granulocytes: 0.02 10*3/uL (ref 0.00–0.07)
Basophils Absolute: 0 10*3/uL (ref 0.0–0.1)
Basophils Relative: 0 %
Eosinophils Absolute: 0 10*3/uL (ref 0.0–0.5)
Eosinophils Relative: 0 %
HCT: 33.7 % — ABNORMAL LOW (ref 36.0–46.0)
Hemoglobin: 10.5 g/dL — ABNORMAL LOW (ref 12.0–15.0)
Immature Granulocytes: 1 %
Lymphocytes Relative: 31 %
Lymphs Abs: 0.7 10*3/uL (ref 0.7–4.0)
MCH: 25.3 pg — ABNORMAL LOW (ref 26.0–34.0)
MCHC: 31.2 g/dL (ref 30.0–36.0)
MCV: 81.2 fL (ref 80.0–100.0)
Monocytes Absolute: 0.2 10*3/uL (ref 0.1–1.0)
Monocytes Relative: 7 %
Neutro Abs: 1.4 10*3/uL — ABNORMAL LOW (ref 1.7–7.7)
Neutrophils Relative %: 61 %
Platelets: 153 10*3/uL (ref 150–400)
RBC: 4.15 MIL/uL (ref 3.87–5.11)
RDW: 15.4 % (ref 11.5–15.5)
WBC: 2.3 10*3/uL — ABNORMAL LOW (ref 4.0–10.5)
nRBC: 0 % (ref 0.0–0.2)

## 2019-11-22 LAB — COMPREHENSIVE METABOLIC PANEL
ALT: 76 U/L — ABNORMAL HIGH (ref 0–44)
AST: 45 U/L — ABNORMAL HIGH (ref 15–41)
Albumin: 2.9 g/dL — ABNORMAL LOW (ref 3.5–5.0)
Alkaline Phosphatase: 78 U/L (ref 38–126)
Anion gap: 8 (ref 5–15)
BUN: 6 mg/dL (ref 6–20)
CO2: 27 mmol/L (ref 22–32)
Calcium: 8.1 mg/dL — ABNORMAL LOW (ref 8.9–10.3)
Chloride: 107 mmol/L (ref 98–111)
Creatinine, Ser: 0.6 mg/dL (ref 0.44–1.00)
GFR calc Af Amer: >60 mL/min (ref >60)
GFR calc non Af Amer: >60 mL/min (ref >60)
Glucose, Bld: 103 mg/dL — ABNORMAL HIGH (ref 70–99)
Potassium: 3.9 mmol/L (ref 3.5–5.1)
Sodium: 142 mmol/L (ref 135–145)
Total Bilirubin: 0.4 mg/dL (ref 0.3–1.2)
Total Protein: 5.8 g/dL — ABNORMAL LOW (ref 6.5–8.1)

## 2019-11-22 LAB — URINE CULTURE: Culture: <10000 — AB

## 2019-11-22 LAB — TROPONIN I (HIGH SENSITIVITY): Troponin I (High Sensitivity): 4 ng/L (ref <18)

## 2019-11-22 LAB — LIPASE, BLOOD: Lipase: 31 U/L (ref 11–51)

## 2019-11-22 LAB — D-DIMER, QUANTITATIVE: D-Dimer, Quant: 0.53 ug/mL-FEU — ABNORMAL HIGH (ref 0.00–0.50)

## 2019-11-22 LAB — MAGNESIUM: Magnesium: 2.2 mg/dL (ref 1.7–2.4)

## 2019-11-22 LAB — C-REACTIVE PROTEIN: CRP: 1.9 mg/dL — ABNORMAL HIGH (ref <1.0)

## 2019-11-22 MED ORDER — ALUM & MAG HYDROXIDE-SIMETH 200-200-20 MG/5ML PO SUSP
30.0000 mL | Freq: Four times a day (QID) | ORAL | Status: AC
  Administered 2019-11-22 (×2): 30 mL via ORAL
  Filled 2019-11-22 (×2): qty 30

## 2019-11-22 MED ORDER — PANTOPRAZOLE SODIUM 40 MG IV SOLR
40.0000 mg | Freq: Two times a day (BID) | INTRAVENOUS | Status: DC
  Administered 2019-11-22 – 2019-11-23 (×3): 40 mg via INTRAVENOUS
  Filled 2019-11-22 (×3): qty 40

## 2019-11-22 MED ORDER — PROCHLORPERAZINE EDISYLATE 10 MG/2ML IJ SOLN
10.0000 mg | Freq: Four times a day (QID) | INTRAMUSCULAR | Status: DC | PRN
  Administered 2019-11-22 – 2019-11-23 (×2): 10 mg via INTRAVENOUS
  Filled 2019-11-22 (×3): qty 2

## 2019-11-22 MED ORDER — PROMETHAZINE HCL 25 MG RE SUPP
25.0000 mg | Freq: Four times a day (QID) | RECTAL | Status: DC | PRN
  Filled 2019-11-22: qty 1

## 2019-11-22 NOTE — Progress Notes (Addendum)
PROGRESS NOTE                                                                                                                                                                                                             Patient Demographics:    Judith Bentley, is a 44 y.o. female, DOB - 05-Sep-1975, EAV:[id number]  Outpatient Primary MD for the patient is Patient, No Pcp Per    LOS - 1  Admit date - 11/20/2019    Chief Complaint  Patient presents with  . Covid Positive       Brief Narrative - Judith Bentley is a 44 y.o. female with no significant past medical history was brought to the ER after patient had lost consciousness.  Patient states about 5 days ago patient started developing headache fever chills upper respiratory tract symptoms with nausea vomiting.  She was diagnosed with Covid PNA and admitted.   Subjective:   Patient in bed, appears comfortable, denies any headache, no fever, no chest pain or pressure, no shortness of breath , no abdominal pain but +ve nausea . No focal weakness.   Assessment  & Plan :     1.  Acute Covid 19 Viral Pneumonitis during the ongoing 2020 Covid 19 Pandemic - she is unfortunately unvaccinated and so far appears to have mild parenchymal lung injury, will be started on combination of steroids and Remdesivir.  Monitor closely.  She has consented for Actemra/Baricitinib use if she gets worse.  Encouraged the patient to sit up in chair in the daytime use I-S and flutter valve for pulmonary toiletry and then prone in bed when at night.  Will advance activity and titrate down oxygen as possible.   Recent Labs  Lab 11/20/19 2222 11/20/19 2228 11/21/19 0615 11/21/19 0856 11/22/19 0318  WBC 2.9*  --  2.0*  --  2.3*  PLT 161  --  154  --  153  CRP  --   --  1.9*  --  1.9*  BNP  --   --   --  39.1  --   DDIMER  --   --  0.61*  --  0.53*  AST 101*  --   --   --  45*  ALT 97*  --    --   --  76*  ALKPHOS 89  --   --   --  78  BILITOT 0.9  --   --   --  0.4  ALBUMIN 3.4*  --   --   --  2.9*  SARSCOV2NAA  --  POSITIVE*  --   --   --     2.  Generalized body aches and some intermittent flank pain.  CT scan head,  abdomen - pelvis non acute, stable UA, monitor with IVF and supportive care.   3.  Mild asymptomatic transaminitis due to viral infection.  Trend.  4.  Hypokalemia.  Replaced and stable.  5.  Mild leukopenia likely due to viral illness.  Monitor with supportive care.  Repeat counts in the morning.  6.  Dehydration induced syncope.  Stabilized, symptom-free, head CT unremarkable, no focal deficits, hydrated with IV fluids and monitor with PT.  7.  Nausea.  Likely due to gastritis, IV PPI, repeat KUB, soft diet and monitor.  We will also check lipase.  She also had some reproducible musculoskeletal chest discomfort for which EKG was checked and stable, will check 1 set of troponin as well.    Condition - Fair  Family Communication  : Husband [phone] over the phone on 11/22/2019  Code Status :  Full  Consults  :  None  Procedures  :    CT Head and ADB - Pelvis - Non acute  PUD Prophylaxis : None  Disposition Plan  :    Status is: Inpt  Dispo: The patient is from: Home              Anticipated d/c is to: Home              Anticipated d/c date is: 3 days              Patient currently is not medically stable to d/c.   DVT Prophylaxis  :  Lovenox    Lab Results  Component Value Date   PLT 153 11/22/2019    Diet :  Diet Order            DIET SOFT Room service appropriate? Yes; Fluid consistency: Thin  Diet effective now                  Inpatient Medications  Scheduled Meds: . alum & mag hydroxide-simeth  30 mL Oral Q6H  . dexamethasone (DECADRON) injection  6 mg Intravenous Q24H  . enoxaparin (LOVENOX) injection  40 mg Subcutaneous Q24H  . pantoprazole (PROTONIX) IV  40 mg Intravenous Q12H   Continuous Infusions: .  remdesivir 100 mg in NS 100 mL 100 mg (11/22/19 1005)   PRN Meds:.acetaminophen **OR** [DISCONTINUED] acetaminophen, chlorpheniramine-HYDROcodone, guaiFENesin-dextromethorphan, prochlorperazine, promethazine  Antibiotics  :    Anti-infectives (From admission, onward)   Start     Dose/Rate Route Frequency Ordered Stop   11/22/19 1000  remdesivir 100 mg in sodium chloride 0.9 % 100 mL IVPB       "Followed by" Linked Group Details   100 mg 200 mL/hr over 30 Minutes Intravenous Daily 11/21/19 1243 11/26/19 0959   11/21/19 1315  remdesivir 200 mg in sodium chloride 0.9% 250 mL IVPB       "Followed by" Linked Group Details   200 mg 580 mL/hr over 30 Minutes Intravenous Once 11/21/19 1243 11/21/19 1540       Time Spent in minutes  30   Susa Raring M.D on 11/22/2019 at 11:18 AM  To page go to [email or web address] - password [email or web address]  [facility] -  Office  [phone]    See all Orders from today for further details    Objective:   Vitals:   11/21/19 2204 11/22/19 0400 11/22/19 0520 11/22/19 0809  BP: 99/62  122/78 114/72  Pulse: 81   77  Resp: 18  18 20   Temp: 98.8 F (37.1 C) 98.4 F (36.9 C) 99 F (37.2 C) 99 F (37.2 C)  TempSrc: Oral Oral Oral   SpO2: 94%  96%   Weight:      Height:        Wt Readings from Last 3 Encounters:  11/21/19 113.4 kg     Intake/Output Summary (Last 24 hours) at 11/22/2019 1118 Last data filed at 11/22/2019 0800 Gross per 24 hour  Intake 2539.73 ml  Output 851 ml  Net 1688.73 ml     Physical Exam  Awake Alert, No new F.N deficits, Normal affect Christopher.AT,PERRAL Supple Neck,No JVD, No cervical lymphadenopathy appriciated.  Symmetrical Chest wall movement, Good air movement bilaterally, CTAB RRR,No Gallops, Rubs or new Murmurs, No Parasternal Heave +ve B.Sounds, Abd Soft, No tenderness, No organomegaly appriciated, No rebound - guarding or rigidity. No Cyanosis, Clubbing or edema, No new Rash or bruise      Data Review:     CBC Recent Labs  Lab 11/20/19 2222 11/21/19 0615 11/22/19 0318  WBC 2.9* 2.0* 2.3*  HGB 11.8* 10.3* 10.5*  HCT 36.7 33.5* 33.7*  PLT 161 154 153  MCV 78.9* 80.1 81.2  MCH 25.4* 24.6* 25.3*  MCHC 32.2 30.7 31.2  RDW 15.0 15.3 15.4  LYMPHSABS 0.6*  --  0.7  MONOABS 0.1  --  0.2  EOSABS 0.0  --  0.0  BASOSABS 0.0  --  0.0    Recent Labs  Lab 11/20/19 2222 11/21/19 0615 11/21/19 0856 11/22/19 0318  NA 140  --  143 142  K 3.2*  --  3.5 3.9  CL 104  --  107 107  CO2 25  --  27 27  GLUCOSE 122*  --  97 103*  BUN 10  --  7 6  CREATININE 0.69 0.62 0.58 0.60  CALCIUM 8.7*  --  7.9* 8.1*  AST 101*  --   --  45*  ALT 97*  --   --  76*  ALKPHOS 89  --   --  78  BILITOT 0.9  --   --  0.4  ALBUMIN 3.4*  --   --  2.9*  MG  --   --  2.0 2.2  CRP  --  1.9*  --  1.9*  DDIMER  --  0.61*  --  0.53*  BNP  --   --  39.1  --     ------------------------------------------------------------------------------------------------------------------ No results for input(s): CHOL, HDL, LDLCALC, TRIG, CHOLHDL, LDLDIRECT in the last 72 hours.  No results found for: HGBA1C ------------------------------------------------------------------------------------------------------------------ No results for input(s): TSH, T4TOTAL, T3FREE, THYROIDAB in the last 72 hours.  Invalid input(s): FREET3  Cardiac Enzymes No results for input(s): CKMB, TROPONINI, MYOGLOBIN in the last 168 hours.  Invalid input(s): CK ------------------------------------------------------------------------------------------------------------------    Component Value Date/Time   BNP 39.1 11/21/2019 0856    Micro Results Recent Results (from the past 240 hour(s))  Respiratory Panel by RT PCR (Flu A&B, Covid) - Nasopharyngeal Swab     Status: Abnormal   Collection Time: 11/20/19 10:28 PM   Specimen: Nasopharyngeal Swab  Result Value Ref Range Status   SARS Coronavirus 2 by RT PCR POSITIVE (A) NEGATIVE Final  Comment: RESULT CALLED TO, READ BACK BY AND VERIFIED WITH: Alyse LowS NEWSOM RN 11/20/19 AT 2352 SK (NOTE) SARS-CoV-2 target nucleic acids are DETECTED.  SARS-CoV-2 RNA is generally detectable in upper respiratory specimens  during the acute phase of infection. Positive results are indicative of the presence of the identified virus, but do not rule out bacterial infection or co-infection with other pathogens not detected by the test. Clinical correlation with patient history and other diagnostic information is necessary to determine patient infection status. The expected result is Negative.  Fact Sheet for Patients:  https://www.moore.com/https://www.fda.gov/media/142436/download  Fact Sheet for Healthcare Providers: https://www.young.biz/https://www.fda.gov/media/142435/download  This test is not yet approved or cleared by the [place] FDA and  has been authorized for detection and/or diagnosis of SARS-CoV-2 by FDA under an Emergency Use Authorization (EUA).  This EUA will remain in effect (meaning this test can be use d) for the duration of  the COVID-19 declaration under Section 564(b)(1) of the Act, 21 U.S.C. section 360bbb-3(b)(1), unless the authorization is terminated or revoked sooner.      Influenza A by PCR NEGATIVE NEGATIVE Final   Influenza B by PCR NEGATIVE NEGATIVE Final    Comment: (NOTE) The Xpert Xpress SARS-CoV-2/FLU/RSV assay is intended as an aid in  the diagnosis of influenza from Nasopharyngeal swab specimens and  should not be used as a sole basis for treatment. Nasal washings and  aspirates are unacceptable for Xpert Xpress SARS-CoV-2/FLU/RSV  testing.  Fact Sheet for Patients: https://www.moore.com/https://www.fda.gov/media/142436/download  Fact Sheet for Healthcare Providers: https://www.young.biz/https://www.fda.gov/media/142435/download  This test is not yet approved or cleared by the [place] FDA and  has been authorized for detection and/or diagnosis of SARS-CoV-2 by  FDA under an Emergency Use Authorization (EUA). This EUA  will remain  in effect (meaning this test can be used) for the duration of the  Covid-19 declaration under Section 564(b)(1) of the Act, 21  U.S.C. section 360bbb-3(b)(1), unless the authorization is  terminated or revoked. Performed at [facility], [address] [place], [place] [address]   Culture, Urine     Status: Abnormal   Collection Time: 11/21/19  7:16 AM   Specimen: Urine, Random  Result Value Ref Range Status   Specimen Description URINE, RANDOM  Final   Special Requests NONE  Final   Culture (A)  Final    <10,000 COLONIES/mL INSIGNIFICANT GROWTH Performed at [facility], [address] [place], [place] [address]    Report Status 11/22/2019 FINAL  Final    Radiology Reports CT HEAD WO CONTRAST  Result Date: 11/21/2019 CLINICAL DATA:  Mental status change.  No reported injury. EXAM: CT HEAD WITHOUT CONTRAST TECHNIQUE: Contiguous axial images were obtained from the base of the skull through the vertex without intravenous contrast. COMPARISON:  10/05/2010 head CT. FINDINGS: Brain: No evidence of parenchymal hemorrhage or extra-axial fluid collection. No mass lesion, mass effect, or midline shift. No CT evidence of acute infarction. Cerebral volume is age appropriate. No ventriculomegaly. Vascular: No acute abnormality. Skull: No evidence of calvarial fracture. Sinuses/Orbits: No fluid levels. Mild mucoperiosteal thickening throughout the visualized paranasal sinuses bilaterally. Other:  The mastoid air cells are unopacified. IMPRESSION: 1. No evidence of acute intracranial abnormality. 2. Mild chronic appearing paranasal sinusitis. Electronically Signed   By: Delbert PhenixJason A Poff M.D.   On: 11/21/2019 05:09   DG Chest Port 1 View  Result Date: 11/20/2019 CLINICAL DATA:  Cough COVID EXAM: PORTABLE CHEST 1 VIEW COMPARISON:  None. FINDINGS: Patchy basilar airspace disease. No  pleural effusion. Normal heart size. No pneumothorax. IMPRESSION: Patchy basilar airspace disease  suspicious for pneumonia. Electronically Signed   By: Jasmine Pang M.D.   On: 11/20/2019 22:40   CT RENAL STONE STUDY  Result Date: 11/21/2019 CLINICAL DATA:  Left lower abdominal pain, nausea and vomiting. COVID positive. Syncopal episode. EXAM: CT ABDOMEN AND PELVIS WITHOUT CONTRAST TECHNIQUE: Multidetector CT imaging of the abdomen and pelvis was performed following the standard protocol without IV contrast. COMPARISON:  09/07/2019 CT chest, abdomen and pelvis. FINDINGS: Lower chest: Patchy ground-glass opacity and peripheral consolidation at both lung bases, new. Hepatobiliary: Normal liver size. No liver mass. Cholecystectomy. No biliary ductal dilatation. Pancreas: Normal, with no mass or duct dilation. Spleen: Normal size. No mass. Adrenals/Urinary Tract: Normal adrenals. No renal stones. No hydronephrosis. No contour deforming renal masses. Normal bladder. Stomach/Bowel: Normal non-distended stomach. Normal caliber small bowel with no small bowel wall thickening. Normal appendix. Normal large bowel with no diverticulosis, large bowel wall thickening or pericolonic fat stranding. Vascular/Lymphatic: Normal caliber abdominal aorta. No pathologically enlarged lymph nodes in the abdomen or pelvis. Reproductive: Grossly normal uterus.  No adnexal mass. Other: No pneumoperitoneum, ascites or focal fluid collection. Small fat containing umbilical hernia. Musculoskeletal: No aggressive appearing focal osseous lesions. Mild thoracolumbar spondylosis. IMPRESSION: 1. Patchy ground-glass opacity and peripheral consolidation at both lung bases, new, compatible with COVID-19 pneumonia. 2. No acute abnormality in the abdomen or pelvis. No evidence of bowel obstruction or acute bowel inflammation. Normal appendix. 3. Small fat containing umbilical hernia. Electronically Signed   By: Delbert Phenix M.D.   On: 11/21/2019 08:20

## 2019-11-22 NOTE — Evaluation (Signed)
Physical Therapy Evaluation Patient Details Name: Judith Bentley MRN: [id number] DOB: September 26, 1975 Today's Date: 11/22/2019   History of Present Illness  Judith Bentley is a 44 y.o. female with no significant past medical history was brought to the ER after patient had lost consciousness.  Patient states about 5 days ago patient started developing headache fever chills upper respiratory tract symptoms with nausea vomiting.  Since then patient has been having persistent weakness and dizziness.  Patient's husband was diagnosed with COVID-19 infection about 3 days ago.  Clinical Impression  Pt admitted with/for covid symptoms with n/v.  Pt is independent in a confined homelike area.  Pt currently limited functionally due to the problems listed below.  (see problems list.)  Pt will benefit from PT to maximize function and safety to be able to get home safely with available assist .     Follow Up Recommendations No PT follow up    Equipment Recommendations  None recommended by PT    Recommendations for Other Services       Precautions / Restrictions        Mobility  Bed Mobility               General bed mobility comments: up walking around the room on arrival  Transfers Overall transfer level: Independent                  Ambulation/Gait Ambulation/Gait assistance: Independent Gait Distance (Feet): 50 Feet Assistive device: None Gait Pattern/deviations: WFL(Within Functional Limits)   Gait velocity interpretation: 1.31 - 2.62 ft/sec, indicative of limited community ambulator General Gait Details: walking around without issue.  Standing for long conversation.  SpO2  94% on RA.  Stairs            Wheelchair Mobility    Modified Rankin (Stroke Patients Only)       Balance Overall balance assessment: No apparent balance deficits (not formally assessed)                                           Pertinent Vitals/Pain Pain  Assessment: No/denies pain    Home Living Family/patient expects to be discharged to:: Private residence Living Arrangements: Spouse/significant other (and 3/6 children) Available Help at Discharge: Family;Available 24 hours/day Type of Home: House         Home Equipment: None      Prior Function Level of Independence: Independent               Hand Dominance        Extremity/Trunk Assessment   Upper Extremity Assessment Upper Extremity Assessment: Overall WFL for tasks assessed    Lower Extremity Assessment Lower Extremity Assessment: Overall WFL for tasks assessed       Communication   Communication: No difficulties  Cognition Arousal/Alertness: Awake/alert Behavior During Therapy: WFL for tasks assessed/performed Overall Cognitive Status: Within Functional Limits for tasks assessed                                        General Comments      Exercises     Assessment/Plan    PT Assessment Patient needs continued PT services  PT Problem List Decreased activity tolerance;Decreased mobility;Cardiopulmonary status limiting activity       PT Treatment Interventions Gait training;Functional  mobility training;Therapeutic activities;Patient/family education    PT Goals (Current goals can be found in the Care Plan section)  Acute Rehab PT Goals Patient Stated Goal: home when I can PT Goal Formulation: With patient Time For Goal Achievement: 11/29/19 Potential to Achieve Goals: Good    Frequency Min 2X/week   Barriers to discharge        Co-evaluation               AM-PAC PT "6 Clicks" Mobility  Outcome Measure Help needed turning from your back to your side while in a flat bed without using bedrails?: None Help needed moving from lying on your back to sitting on the side of a flat bed without using bedrails?: None Help needed moving to and from a bed to a chair (including a wheelchair)?: None Help needed standing up from  a chair using your arms (e.g., wheelchair or bedside chair)?: None Help needed to walk in hospital room?: None Help needed climbing 3-5 steps with a railing? : None 6 Click Score: 24    End of Session   Activity Tolerance: Patient tolerated treatment well Patient left: Other (comment);with call bell/phone within reach (up in the room) Nurse Communication: Mobility status PT Visit Diagnosis: Difficulty in walking, not elsewhere classified (R26.2)    Time: 2725-3664 PT Time Calculation (min) (ACUTE ONLY): 22 min   Charges:   PT Evaluation $PT Eval Low Complexity: 1 Low          11/22/2019  Jacinto Halim., PT Acute Rehabilitation Services [phone]  (pager) [phone]  (office)  Eliseo Gum Jhostin Epps 11/22/2019, 4:29 PM

## 2019-11-23 LAB — CBC WITH DIFFERENTIAL/PLATELET
Abs Immature Granulocytes: 0.03 10*3/uL (ref 0.00–0.07)
Basophils Absolute: 0 10*3/uL (ref 0.0–0.1)
Basophils Relative: 0 %
Eosinophils Absolute: 0 10*3/uL (ref 0.0–0.5)
Eosinophils Relative: 0 %
HCT: 33.3 % — ABNORMAL LOW (ref 36.0–46.0)
Hemoglobin: 10.2 g/dL — ABNORMAL LOW (ref 12.0–15.0)
Immature Granulocytes: 1 %
Lymphocytes Relative: 36 %
Lymphs Abs: 0.9 10*3/uL (ref 0.7–4.0)
MCH: 24.6 pg — ABNORMAL LOW (ref 26.0–34.0)
MCHC: 30.6 g/dL (ref 30.0–36.0)
MCV: 80.2 fL (ref 80.0–100.0)
Monocytes Absolute: 0.2 10*3/uL (ref 0.1–1.0)
Monocytes Relative: 8 %
Neutro Abs: 1.4 10*3/uL — ABNORMAL LOW (ref 1.7–7.7)
Neutrophils Relative %: 55 %
Platelets: 164 10*3/uL (ref 150–400)
RBC: 4.15 MIL/uL (ref 3.87–5.11)
RDW: 15.1 % (ref 11.5–15.5)
WBC: 2.6 10*3/uL — ABNORMAL LOW (ref 4.0–10.5)
nRBC: 0 % (ref 0.0–0.2)

## 2019-11-23 LAB — COMPREHENSIVE METABOLIC PANEL
ALT: 62 U/L — ABNORMAL HIGH (ref 0–44)
AST: 30 U/L (ref 15–41)
Albumin: 3 g/dL — ABNORMAL LOW (ref 3.5–5.0)
Alkaline Phosphatase: 79 U/L (ref 38–126)
Anion gap: 6 (ref 5–15)
BUN: 8 mg/dL (ref 6–20)
CO2: 32 mmol/L (ref 22–32)
Calcium: 8.5 mg/dL — ABNORMAL LOW (ref 8.9–10.3)
Chloride: 102 mmol/L (ref 98–111)
Creatinine, Ser: 0.67 mg/dL (ref 0.44–1.00)
GFR calc Af Amer: >60 mL/min (ref >60)
GFR calc non Af Amer: >60 mL/min (ref >60)
Glucose, Bld: 111 mg/dL — ABNORMAL HIGH (ref 70–99)
Potassium: 3.6 mmol/L (ref 3.5–5.1)
Sodium: 140 mmol/L (ref 135–145)
Total Bilirubin: 0.7 mg/dL (ref 0.3–1.2)
Total Protein: 5.9 g/dL — ABNORMAL LOW (ref 6.5–8.1)

## 2019-11-23 LAB — D-DIMER, QUANTITATIVE: D-Dimer, Quant: 0.6 ug/mL-FEU — ABNORMAL HIGH (ref 0.00–0.50)

## 2019-11-23 LAB — MAGNESIUM: Magnesium: 2.2 mg/dL (ref 1.7–2.4)

## 2019-11-23 LAB — C-REACTIVE PROTEIN: CRP: 1.9 mg/dL — ABNORMAL HIGH (ref <1.0)

## 2019-11-23 MED ORDER — NEXIUM 24HR 20 MG PO TBEC: 40.0000 mg | DELAYED_RELEASE_TABLET | Freq: Two times a day (BID) | ORAL | 0 refills | Status: DC

## 2019-11-23 MED ORDER — ALBUTEROL SULFATE HFA 108 (90 BASE) MCG/ACT IN AERS
2.0000 | INHALATION_SPRAY | Freq: Four times a day (QID) | RESPIRATORY_TRACT | 0 refills | Status: DC | PRN
Start: 2019-11-23 — End: 2022-06-11

## 2019-11-23 MED ORDER — PROMETHAZINE HCL 25 MG PO TABS: 25.0000 mg | ORAL_TABLET | Freq: Three times a day (TID) | ORAL | 0 refills | Status: DC | PRN

## 2019-11-23 MED ORDER — METHYLPREDNISOLONE 4 MG PO TBPK: ORAL_TABLET | ORAL | 0 refills | Status: DC

## 2019-11-23 NOTE — Discharge Instructions (Signed)
Patient scheduled for outpatient Remdesivir infusions at 11am on Tuesday 10/5 and Wednesday 10/6 at [facility]. Please inform the patient to park at [address] [place], [place], as staff will be escorting the patient through the east entrance of the hospital. Appointments take approximately 45 minutes.    There is a wave flag banner located near the entrance on [address]. Turn into this entrance and immediately turn left or right and park in 1 of the 10 designated Covid Infusion Parking spots. There is a phone number on the sign, please call and let the staff know what spot you are in and we will come out and get you. For questions call [phone].  Thanks.    Follow with Primary MD in 7 days   Get CBC, CMP, 2 view Chest X ray -  checked next visit within 1 week by Primary MD    Activity: As tolerated with Full fall precautions use walker/cane & assistance as needed  Disposition Home    Diet: Heart Healthy   Special Instructions: If you have smoked or chewed Tobacco  in the last 2 yrs please stop smoking, stop any regular Alcohol  and or any Recreational drug use.  On your next visit with your primary care physician please Get Medicines reviewed and adjusted.  Please request your Prim.MD to go over all Hospital Tests and Procedure/Radiological results at the follow up, please get all Hospital records sent to your Prim MD by signing hospital release before you go home.  If you experience worsening of your admission symptoms, develop shortness of breath, life threatening emergency, suicidal or homicidal thoughts you must seek medical attention immediately by calling 911 or calling your MD immediately  if symptoms less severe.  You Must read complete instructions/literature along with all the possible adverse reactions/side effects for all the Medicines you take and that have been prescribed to you. Take any new Medicines after you have completely understood and accpet all the  possible adverse reactions/side effects.       Person Under Monitoring Name: Judith Bentley  Location: [address] [place] [address]   Infection Prevention Recommendations for Individuals Confirmed to have, or Being Evaluated for, 2019 Novel Coronavirus (COVID-19) Infection Who Receive Care at Home  Individuals who are confirmed to have, or are being evaluated for, COVID-19 should follow the prevention steps below until a healthcare provider or local or state health department says they can return to normal activities.  Stay home except to get medical care You should restrict activities outside your home, except for getting medical care. Do not go to work, school, or public areas, and do not use public transportation or taxis.  Call ahead before visiting your doctor Before your medical appointment, call the healthcare provider and tell them that you have, or are being evaluated for, COVID-19 infection. This will help the healthcare provider's office take steps to keep other people from getting infected. Ask your healthcare provider to call the local or state health department.  Monitor your symptoms Seek prompt medical attention if your illness is worsening (e.g., difficulty breathing). Before going to your medical appointment, call the healthcare provider and tell them that you have, or are being evaluated for, COVID-19 infection. Ask your healthcare provider to call the local or state health department.  Wear a facemask You should wear a facemask that covers your nose and mouth when you are in the same room with other people and when you visit  a healthcare provider. People who live with or visit you should also wear a facemask while they are in the same room with you.  Separate yourself from other people in your home As much as possible, you should stay in a different room from other people in your home. Also, you should use a separate bathroom, if  available.  Avoid sharing household items You should not share dishes, drinking glasses, cups, eating utensils, towels, bedding, or other items with other people in your home. After using these items, you should wash them thoroughly with soap and water.  Cover your coughs and sneezes Cover your mouth and nose with a tissue when you cough or sneeze, or you can cough or sneeze into your sleeve. Throw used tissues in a lined trash can, and immediately wash your hands with soap and water for at least 20 seconds or use an alcohol-based hand rub.  Wash your [employer] your hands often and thoroughly with soap and water for at least 20 seconds. You can use an alcohol-based hand sanitizer if soap and water are not available and if your hands are not visibly dirty. Avoid touching your eyes, nose, and mouth with unwashed hands.   Prevention Steps for Caregivers and Household Members of Individuals Confirmed to have, or Being Evaluated for, COVID-19 Infection Being Cared for in the Home  If you live with, or provide care at home for, a person confirmed to have, or being evaluated for, COVID-19 infection please follow these guidelines to prevent infection:  Follow healthcare provider's instructions Make sure that you understand and can help the patient follow any healthcare provider instructions for all care.  Provide for the patient's basic needs You should help the patient with basic needs in the home and provide support for getting groceries, prescriptions, and other personal needs.  Monitor the patient's symptoms If they are getting sicker, call his or her medical provider and tell them that the patient has, or is being evaluated for, COVID-19 infection. This will help the healthcare provider's office take steps to keep other people from getting infected. Ask the healthcare provider to call the local or state health department.  Limit the number of people who have contact with the  patient  If possible, have only one caregiver for the patient.  Other household members should stay in another home or place of residence. If this is not possible, they should stay  in another room, or be separated from the patient as much as possible. Use a separate bathroom, if available.  Restrict visitors who do not have an essential need to be in the home.  Keep older adults, very young children, and other sick people away from the patient Keep older adults, very young children, and those who have compromised immune systems or chronic health conditions away from the patient. This includes people with chronic heart, lung, or kidney conditions, diabetes, and cancer.  Ensure good ventilation Make sure that shared spaces in the home have good air flow, such as from an air conditioner or an opened window, weather permitting.  Wash your hands often  Wash your hands often and thoroughly with soap and water for at least 20 seconds. You can use an alcohol based hand sanitizer if soap and water are not available and if your hands are not visibly dirty.  Avoid touching your eyes, nose, and mouth with unwashed hands.  Use disposable paper towels to dry your hands. If not available, use dedicated cloth towels and replace  them when they become wet.  Wear a facemask and gloves  Wear a disposable facemask at all times in the room and gloves when you touch or have contact with the patient's blood, body fluids, and/or secretions or excretions, such as sweat, saliva, sputum, nasal mucus, vomit, urine, or feces.  Ensure the mask fits over your nose and mouth tightly, and do not touch it during use.  Throw out disposable facemasks and gloves after using them. Do not reuse.  Wash your hands immediately after removing your facemask and gloves.  If your personal clothing becomes contaminated, carefully remove clothing and launder. Wash your hands after handling contaminated clothing.  Place all used  disposable facemasks, gloves, and other waste in a lined container before disposing them with other household waste.  Remove gloves and wash your hands immediately after handling these items.  Do not share dishes, glasses, or other household items with the patient  Avoid sharing household items. You should not share dishes, drinking glasses, cups, eating utensils, towels, bedding, or other items with a patient who is confirmed to have, or being evaluated for, COVID-19 infection.  After the person uses these items, you should wash them thoroughly with soap and water.  Wash laundry thoroughly  Immediately remove and wash clothes or bedding that have blood, body fluids, and/or secretions or excretions, such as sweat, saliva, sputum, nasal mucus, vomit, urine, or feces, on them.  Wear gloves when handling laundry from the patient.  Read and follow directions on labels of laundry or clothing items and detergent. In general, wash and dry with the warmest temperatures recommended on the label.  Clean all areas the individual has used often  Clean all touchable surfaces, such as counters, tabletops, doorknobs, bathroom fixtures, toilets, phones, keyboards, tablets, and bedside tables, every day. Also, clean any surfaces that may have blood, body fluids, and/or secretions or excretions on them.  Wear gloves when cleaning surfaces the patient has come in contact with.  Use a diluted bleach solution (e.g., dilute bleach with 1 part bleach and 10 parts water) or a household disinfectant with a label that says EPA-registered for coronaviruses. To make a bleach solution at home, add 1 tablespoon of bleach to 1 quart (4 cups) of water. For a larger supply, add  cup of bleach to 1 gallon (16 cups) of water.  Read labels of cleaning products and follow recommendations provided on product labels. Labels contain instructions for safe and effective use of the cleaning product including precautions you should  take when applying the product, such as wearing gloves or eye protection and making sure you have good ventilation during use of the product.  Remove gloves and wash hands immediately after cleaning.  Monitor yourself for signs and symptoms of illness Caregivers and household members are considered close contacts, should monitor their health, and will be asked to limit movement outside of the home to the extent possible. Follow the monitoring steps for close contacts listed on the symptom monitoring form.   ? If you have additional questions, contact your local health department or call the epidemiologist on call at [phone] (available 24/7). ? This guidance is subject to change. For the most up-to-date guidance from [facility], please refer to their website: [email or web address]

## 2019-11-23 NOTE — Progress Notes (Signed)
Patient scheduled for outpatient Remdesivir infusions at 11am on Tuesday 10/5 and Wednesday 10/6 at [facility]. Please inform the patient to park at [address] [place], [place], as staff will be escorting the patient through the east entrance of the hospital. Appointments take approximately 45 minutes.    There is a wave flag banner located near the entrance on [address]. Turn into this entrance and immediately turn left or right and park in 1 of the 10 designated Covid Infusion Parking spots. There is a phone number on the sign, please call and let the staff know what spot you are in and we will come out and get you. For questions call [phone].  Thanks.

## 2019-11-23 NOTE — Progress Notes (Signed)
Prescriptions were left at hospital at discharge.  Spoke to patients husband and notified them that prescriptions would be faxed to their pharmacy, [facility] [place].  Husband stated that patient was "running a fever" since getting home and tylenol hasn't worked.  I spoke with Dr and notified husband if symptoms continued or got worse to bring patient to ED.

## 2019-11-23 NOTE — Discharge Summary (Signed)
Judith Bentley JJK:[id number] DOB: 16-Jul-1975 DOA: 11/20/2019  PCP: Patient, No Pcp Per  Admit date: 11/20/2019  Discharge date: 11/23/2019  Admitted From: Home  Disposition:  Home   Recommendations for Outpatient Follow-up:   Follow up with PCP in 1-2 weeks  PCP Please obtain BMP/CBC, 2 view CXR in 1week,  (see Discharge instructions)   PCP Please follow up on the following pending results: Check CBC, CMP and a two-view chest x-ray in 7 to 10 days.  If nausea vomiting recur outpatient GI follow-up is recommended.   Home Health: None   Equipment/Devices: None  Consultations: None  Discharge Condition: Stable    CODE STATUS: Full    Diet Recommendation: Heart Healthy     Chief Complaint  Patient presents with  . Covid Positive     Brief history of present illness from the day of admission and additional interim summary    Judith Bentley a 44 y.o.femalewithno significant past medical history was brought to the ER after patient had lost consciousness. Patient states about 5 days ago patient started developing headache fever chills upper respiratory tract symptoms with nausea vomiting. She was diagnosed with Covid PNA and admitted.                                                                 Hospital Course    1.  Acute Covid 19 Viral Pneumonitis during the ongoing 2020 Covid 19 Pandemic - she is unfortunately unvaccinated thankfully had mild parenchymal lung injury, treated with steroids and Remdesivir, stable and symptom-free on room air and currently symptom-free, will be placed on oral steroid taper with completion of remdesivir infusion in the outpatient clinic in the next 2 days, thereafter follow with PCP.   Recent Labs  Lab 11/20/19 2222 11/20/19 2228 11/21/19 0615 11/21/19 0856  11/22/19 0318 11/23/19 0413  WBC 2.9*  --  2.0*  --  2.3* 2.6*  CRP  --   --  1.9*  --  1.9* 1.9*  DDIMER  --   --  0.61*  --  0.53* 0.60*  BNP  --   --   --  39.1  --   --   AST 101*  --   --   --  45* 30  ALT 97*  --   --   --  76* 62*  ALKPHOS 89  --   --   --  78 79  BILITOT 0.9  --   --   --  0.4 0.7  ALBUMIN 3.4*  --   --   --  2.9* 3.0*  SARSCOV2NAA  --  POSITIVE*  --   --   --   --       2.  Generalized body aches and some intermittent flank pain.  CT scan head,  abdomen - pelvis non acute, stable UA, resolved with IVF and supportive care.   3.  Mild asymptomatic transaminitis due to viral infection.  Trend stable PCP to repeat CMP in 7 to 10 days.  4.  Hypokalemia.  Replaced and stable.  5.  Mild leukopenia likely due to viral illness.  Monitor with supportive care.  Repeat counts in 1 week by PCP.  6.  Dehydration induced syncope.  Stabilized, symptom-free, head CT unremarkable, no focal deficits, hydrated with IV fluids and resolved.  7.  Nausea.  Likely due to #1, stable CT abdomen pelvis along with KUB, resolved after supportive care .  If symptoms recur outpatient GI follow-up.     Discharge diagnosis     Principal Problem:   Syncope Active Problems:   Anemia   Pneumonia due to COVID-19 virus    Discharge instructions    Discharge Instructions    Diet - low sodium heart healthy   Complete by: As directed    Discharge instructions   Complete by: As directed    Follow with Primary MD in 7 days   Get CBC, CMP, 2 view Chest X ray -  checked next visit within 1 week by Primary MD    Activity: As tolerated with Full fall precautions use walker/cane & assistance as needed  Disposition Home    Diet: Heart Healthy   Special Instructions: If you have smoked or chewed Tobacco  in the last 2 yrs please stop smoking, stop any regular Alcohol  and or any Recreational drug use.  On your next visit with your primary care physician please Get Medicines  reviewed and adjusted.  Please request your Prim.MD to go over all Hospital Tests and Procedure/Radiological results at the follow up, please get all Hospital records sent to your Prim MD by signing hospital release before you go home.  If you experience worsening of your admission symptoms, develop shortness of breath, life threatening emergency, suicidal or homicidal thoughts you must seek medical attention immediately by calling 911 or calling your MD immediately  if symptoms less severe.  You Must read complete instructions/literature along with all the possible adverse reactions/side effects for all the Medicines you take and that have been prescribed to you. Take any new Medicines after you have completely understood and accpet all the possible adverse reactions/side effects.   Increase activity slowly   Complete by: As directed       Discharge Medications   Allergies as of 11/23/2019      Reactions   Codeine Nausea And Vomiting      Medication List    STOP taking these medications   ibuprofen 200 MG tablet Commonly known as: ADVIL   naproxen sodium 220 MG tablet Commonly known as: ALEVE   ondansetron 8 MG tablet Commonly known as: ZOFRAN     TAKE these medications   albuterol 108 (90 Base) MCG/ACT inhaler Commonly known as: VENTOLIN HFA Inhale 2 puffs into the lungs every 6 (six) hours as needed for wheezing or shortness of breath.   ascorbic acid 500 MG tablet Commonly known as: VITAMIN C Take 500 mg by mouth daily.   methylPREDNISolone 4 MG Tbpk tablet Commonly known as: MEDROL DOSEPAK follow package directions   NexIUM 24HR 20 MG Tbec Generic drug: Esomeprazole Magnesium Take 40 mg by mouth 2 (two) times daily.   promethazine 25 MG tablet Commonly known as: PHENERGAN Take 1 tablet (25 mg total) by mouth every 8 (eight) hours as needed for  nausea or vomiting.        Follow-up Information    [facility]. Schedule an  appointment as soon as possible for a visit in 1 week(s).   Contact information: [address] [place] [address] [phone]              Major procedures and Radiology Reports - PLEASE review detailed and final reports thoroughly  -       DG Abd 1 View  Result Date: 11/22/2019 CLINICAL DATA:  Nausea, vomiting. EXAM: ABDOMEN - 1 VIEW COMPARISON:  September 07, 2019. FINDINGS: The bowel gas pattern is normal. No radio-opaque calculi or other significant radiographic abnormality are seen. IMPRESSION: Negative. Electronically Signed   By: Lupita Raider M.D.   On: 11/22/2019 14:28   CT HEAD WO CONTRAST  Result Date: 11/21/2019 CLINICAL DATA:  Mental status change.  No reported injury. EXAM: CT HEAD WITHOUT CONTRAST TECHNIQUE: Contiguous axial images were obtained from the base of the skull through the vertex without intravenous contrast. COMPARISON:  10/05/2010 head CT. FINDINGS: Brain: No evidence of parenchymal hemorrhage or extra-axial fluid collection. No mass lesion, mass effect, or midline shift. No CT evidence of acute infarction. Cerebral volume is age appropriate. No ventriculomegaly. Vascular: No acute abnormality. Skull: No evidence of calvarial fracture. Sinuses/Orbits: No fluid levels. Mild mucoperiosteal thickening throughout the visualized paranasal sinuses bilaterally. Other:  The mastoid air cells are unopacified. IMPRESSION: 1. No evidence of acute intracranial abnormality. 2. Mild chronic appearing paranasal sinusitis. Electronically Signed   By: Delbert Phenix M.D.   On: 11/21/2019 05:09   DG Chest Port 1 View  Result Date: 11/20/2019 CLINICAL DATA:  Cough COVID EXAM: PORTABLE CHEST 1 VIEW COMPARISON:  None. FINDINGS: Patchy basilar airspace disease. No pleural effusion. Normal heart size. No pneumothorax. IMPRESSION: Patchy basilar airspace disease suspicious for pneumonia. Electronically Signed   By: Jasmine Pang M.D.   On: 11/20/2019 22:40   CT  RENAL STONE STUDY  Result Date: 11/21/2019 CLINICAL DATA:  Left lower abdominal pain, nausea and vomiting. COVID positive. Syncopal episode. EXAM: CT ABDOMEN AND PELVIS WITHOUT CONTRAST TECHNIQUE: Multidetector CT imaging of the abdomen and pelvis was performed following the standard protocol without IV contrast. COMPARISON:  09/07/2019 CT chest, abdomen and pelvis. FINDINGS: Lower chest: Patchy ground-glass opacity and peripheral consolidation at both lung bases, new. Hepatobiliary: Normal liver size. No liver mass. Cholecystectomy. No biliary ductal dilatation. Pancreas: Normal, with no mass or duct dilation. Spleen: Normal size. No mass. Adrenals/Urinary Tract: Normal adrenals. No renal stones. No hydronephrosis. No contour deforming renal masses. Normal bladder. Stomach/Bowel: Normal non-distended stomach. Normal caliber small bowel with no small bowel wall thickening. Normal appendix. Normal large bowel with no diverticulosis, large bowel wall thickening or pericolonic fat stranding. Vascular/Lymphatic: Normal caliber abdominal aorta. No pathologically enlarged lymph nodes in the abdomen or pelvis. Reproductive: Grossly normal uterus.  No adnexal mass. Other: No pneumoperitoneum, ascites or focal fluid collection. Small fat containing umbilical hernia. Musculoskeletal: No aggressive appearing focal osseous lesions. Mild thoracolumbar spondylosis. IMPRESSION: 1. Patchy ground-glass opacity and peripheral consolidation at both lung bases, new, compatible with COVID-19 pneumonia. 2. No acute abnormality in the abdomen or pelvis. No evidence of bowel obstruction or acute bowel inflammation. Normal appendix. 3. Small fat containing umbilical hernia. Electronically Signed   By: Delbert Phenix M.D.   On: 11/21/2019 08:20    Micro Results     Recent Results (from the past 240 hour(s))  Respiratory Panel by RT PCR (Flu A&B, Covid) - Nasopharyngeal Swab     Status: Abnormal   Collection Time: 11/20/19 10:28 PM    Specimen: Nasopharyngeal Swab  Result Value Ref Range Status   SARS Coronavirus 2 by RT PCR POSITIVE (A) NEGATIVE Final    Comment: RESULT CALLED TO, READ BACK BY AND VERIFIED WITH: Alyse Low RN 11/20/19 AT 2352 SK (NOTE) SARS-CoV-2 target nucleic acids are DETECTED.  SARS-CoV-2 RNA is generally detectable in upper respiratory specimens  during the acute phase of infection. Positive results are indicative of the presence of the identified virus, but do not rule out bacterial infection or co-infection with other pathogens not detected by the test. Clinical correlation with patient history and other diagnostic information is necessary to determine patient infection status. The expected result is Negative.  Fact Sheet for Patients:  https://www.moore.com/  Fact Sheet for Healthcare Providers: https://www.young.biz/  This test is not yet approved or cleared by the [place] FDA and  has been authorized for detection and/or diagnosis of SARS-CoV-2 by FDA under an Emergency Use Authorization (EUA).  This EUA will remain in effect (meaning this test can be use d) for the duration of  the COVID-19 declaration under Section 564(b)(1) of the Act, 21 U.S.C. section 360bbb-3(b)(1), unless the authorization is terminated or revoked sooner.      Influenza A by PCR NEGATIVE NEGATIVE Final   Influenza B by PCR NEGATIVE NEGATIVE Final    Comment: (NOTE) The Xpert Xpress SARS-CoV-2/FLU/RSV assay is intended as an aid in  the diagnosis of influenza from Nasopharyngeal swab specimens and  should not be used as a sole basis for treatment. Nasal washings and  aspirates are unacceptable for Xpert Xpress SARS-CoV-2/FLU/RSV  testing.  Fact Sheet for Patients: https://www.moore.com/  Fact Sheet for Healthcare Providers: https://www.young.biz/  This test is not yet approved or cleared by the [place] FDA and  has  been authorized for detection and/or diagnosis of SARS-CoV-2 by  FDA under an Emergency Use Authorization (EUA). This EUA will remain  in effect (meaning this test can be used) for the duration of the  Covid-19 declaration under Section 564(b)(1) of the Act, 21  U.S.C. section 360bbb-3(b)(1), unless the authorization is  terminated or revoked. Performed at [facility], [address], [place], [place] [address]   Culture, Urine     Status: Abnormal   Collection Time: 11/21/19  7:16 AM   Specimen: Urine, Random  Result Value Ref Range Status   Specimen Description URINE, RANDOM  Final   Special Requests NONE  Final   Culture (A)  Final    <10,000 COLONIES/mL INSIGNIFICANT GROWTH Performed at [facility], [address] [place], [place] [address]    Report Status 11/22/2019 FINAL  Final    Today   Subjective    Judith Bentley today has no headache,no chest abdominal pain,no new weakness tingling or numbness, feels much better wants to go home today.     Objective   Blood pressure 114/75, pulse 97, temperature 98.6 F (37 C), resp. rate 20, height  (1.727 m), weight 113.4 kg, SpO2 92 %.   Intake/Output Summary (Last 24 hours) at 11/23/2019 0913 Last data filed at 11/22/2019 1500 Gross per 24 hour  Intake 100 ml  Output --  Net 100 ml    Exam  Awake Alert, No new F.N deficits, Normal affect [place].AT,PERRAL Supple Neck,No JVD, No cervical lymphadenopathy appriciated.  Symmetrical Chest wall movement, Good  air movement bilaterally, CTAB RRR,No Gallops,Rubs or new Murmurs, No Parasternal Heave +ve B.Sounds, Abd Soft, Non tender, No organomegaly appriciated, No rebound -guarding or rigidity. No Cyanosis, Clubbing or edema, No new Rash or bruise   Data Review   CBC w Diff:  Lab Results  Component Value Date   WBC 2.6 (L) 11/23/2019   HGB 10.2 (L) 11/23/2019   HCT 33.3 (L) 11/23/2019   PLT 164 11/23/2019   LYMPHOPCT 36 11/23/2019   MONOPCT 8  11/23/2019   EOSPCT 0 11/23/2019   BASOPCT 0 11/23/2019    CMP:  Lab Results  Component Value Date   NA 140 11/23/2019   K 3.6 11/23/2019   CL 102 11/23/2019   CO2 32 11/23/2019   BUN 8 11/23/2019   CREATININE 0.67 11/23/2019   PROT 5.9 (L) 11/23/2019   ALBUMIN 3.0 (L) 11/23/2019   BILITOT 0.7 11/23/2019   ALKPHOS 79 11/23/2019   AST 30 11/23/2019   ALT 62 (H) 11/23/2019  .   Total Time in preparing paper work, data evaluation and todays exam - 35 minutes  Susa RaringPrashant Angeldejesus Callaham M.D on 11/23/2019 at 9:13 AM  [facility]   Office  [phone]

## 2019-11-24 ENCOUNTER — Inpatient Hospital Stay (HOSPITAL_COMMUNITY)
Admission: EM | Admit: 2019-11-24 | Discharge: 2019-11-25 | DRG: 177 | Disposition: A | Discharge status: Home / Self Care | Payer: 59 | Attending: Internal Medicine | Admitting: Internal Medicine

## 2019-11-24 ENCOUNTER — Inpatient Hospital Stay (HOSPITAL_COMMUNITY): Payer: 59

## 2019-11-24 ENCOUNTER — Encounter (HOSPITAL_COMMUNITY): Payer: Self-pay | Admitting: Internal Medicine

## 2019-11-24 ENCOUNTER — Ambulatory Visit (HOSPITAL_COMMUNITY): Payer: 59

## 2019-11-24 DIAGNOSIS — I5031 Acute diastolic (congestive) heart failure: Secondary | ICD-10-CM

## 2019-11-24 DIAGNOSIS — E876 Hypokalemia: Secondary | ICD-10-CM | POA: Diagnosis present

## 2019-11-24 DIAGNOSIS — R55 Syncope and collapse: Secondary | ICD-10-CM | POA: Diagnosis present

## 2019-11-24 DIAGNOSIS — R1115 Cyclical vomiting syndrome unrelated to migraine: Secondary | ICD-10-CM | POA: Diagnosis present

## 2019-11-24 DIAGNOSIS — Z6838 Body mass index (BMI) 38.0-38.9, adult: Secondary | ICD-10-CM | POA: Diagnosis not present

## 2019-11-24 DIAGNOSIS — J1282 Pneumonia due to coronavirus disease 2019: Secondary | ICD-10-CM | POA: Diagnosis present

## 2019-11-24 DIAGNOSIS — U071 COVID-19: Principal | ICD-10-CM | POA: Diagnosis present

## 2019-11-24 DIAGNOSIS — R112 Nausea with vomiting, unspecified: Secondary | ICD-10-CM

## 2019-11-24 DIAGNOSIS — E860 Dehydration: Secondary | ICD-10-CM | POA: Diagnosis present

## 2019-11-24 DIAGNOSIS — T380X5A Adverse effect of glucocorticoids and synthetic analogues, initial encounter: Secondary | ICD-10-CM | POA: Diagnosis present

## 2019-11-24 DIAGNOSIS — E6609 Other obesity due to excess calories: Secondary | ICD-10-CM | POA: Diagnosis present

## 2019-11-24 DIAGNOSIS — R739 Hyperglycemia, unspecified: Secondary | ICD-10-CM | POA: Diagnosis present

## 2019-11-24 HISTORY — DX: COVID-19: U07.1

## 2019-11-24 HISTORY — DX: Cyclical vomiting syndrome unrelated to migraine: R11.15

## 2019-11-24 LAB — ECHOCARDIOGRAM LIMITED
Area-P 1/2: 4.41 cm2
S' Lateral: 3 cm

## 2019-11-24 LAB — COMPREHENSIVE METABOLIC PANEL
ALT: 98 U/L — ABNORMAL HIGH (ref 0–44)
AST: 94 U/L — ABNORMAL HIGH (ref 15–41)
Albumin: 3.3 g/dL — ABNORMAL LOW (ref 3.5–5.0)
Alkaline Phosphatase: 86 U/L (ref 38–126)
Anion gap: 11 (ref 5–15)
BUN: 8 mg/dL (ref 6–20)
CO2: 29 mmol/L (ref 22–32)
Calcium: 8.4 mg/dL — ABNORMAL LOW (ref 8.9–10.3)
Chloride: 101 mmol/L (ref 98–111)
Creatinine, Ser: 0.7 mg/dL (ref 0.44–1.00)
GFR calc Af Amer: >60 mL/min (ref >60)
GFR calc non Af Amer: >60 mL/min (ref >60)
Glucose, Bld: 122 mg/dL — ABNORMAL HIGH (ref 70–99)
Potassium: 3.1 mmol/L — ABNORMAL LOW (ref 3.5–5.1)
Sodium: 141 mmol/L (ref 135–145)
Total Bilirubin: 0.8 mg/dL (ref 0.3–1.2)
Total Protein: 6.6 g/dL (ref 6.5–8.1)

## 2019-11-24 LAB — D-DIMER, QUANTITATIVE: D-Dimer, Quant: 0.49 ug/mL-FEU (ref 0.00–0.50)

## 2019-11-24 LAB — FERRITIN: Ferritin: 53 ng/mL (ref 11–307)

## 2019-11-24 LAB — FIBRINOGEN: Fibrinogen: 492 mg/dL — ABNORMAL HIGH (ref 210–475)

## 2019-11-24 LAB — CBC WITH DIFFERENTIAL/PLATELET
Abs Immature Granulocytes: 0.04 10*3/uL (ref 0.00–0.07)
Basophils Absolute: 0 10*3/uL (ref 0.0–0.1)
Basophils Relative: 0 %
Eosinophils Absolute: 0 10*3/uL (ref 0.0–0.5)
Eosinophils Relative: 0 %
HCT: 34.7 % — ABNORMAL LOW (ref 36.0–46.0)
Hemoglobin: 11 g/dL — ABNORMAL LOW (ref 12.0–15.0)
Immature Granulocytes: 1 %
Lymphocytes Relative: 26 %
Lymphs Abs: 0.8 10*3/uL (ref 0.7–4.0)
MCH: 25.3 pg — ABNORMAL LOW (ref 26.0–34.0)
MCHC: 31.7 g/dL (ref 30.0–36.0)
MCV: 79.8 fL — ABNORMAL LOW (ref 80.0–100.0)
Monocytes Absolute: 0.2 10*3/uL (ref 0.1–1.0)
Monocytes Relative: 5 %
Neutro Abs: 2 10*3/uL (ref 1.7–7.7)
Neutrophils Relative %: 68 %
Platelets: 170 10*3/uL (ref 150–400)
RBC: 4.35 MIL/uL (ref 3.87–5.11)
RDW: 15.2 % (ref 11.5–15.5)
WBC: 3 10*3/uL — ABNORMAL LOW (ref 4.0–10.5)
nRBC: 0 % (ref 0.0–0.2)

## 2019-11-24 LAB — PROCALCITONIN: Procalcitonin: <0.1 ng/mL

## 2019-11-24 LAB — LIPASE, BLOOD: Lipase: 33 U/L (ref 11–51)

## 2019-11-24 LAB — LACTATE DEHYDROGENASE: LDH: 176 U/L (ref 98–192)

## 2019-11-24 LAB — C-REACTIVE PROTEIN: CRP: 4 mg/dL — ABNORMAL HIGH (ref <1.0)

## 2019-11-24 MED ORDER — POTASSIUM CHLORIDE IN NACL 40-0.9 MEQ/L-% IV SOLN
INTRAVENOUS | Status: AC
  Filled 2019-11-24 (×2): qty 1000

## 2019-11-24 MED ORDER — ENOXAPARIN SODIUM 60 MG/0.6ML ~~LOC~~ SOLN
55.0000 mg | SUBCUTANEOUS | Status: DC
  Administered 2019-11-24 – 2019-11-25 (×2): 55 mg via SUBCUTANEOUS
  Filled 2019-11-24 (×2): qty 0.6

## 2019-11-24 MED ORDER — PROMETHAZINE HCL 25 MG/ML IJ SOLN
12.5000 mg | Freq: Once | INTRAMUSCULAR | Status: AC
  Administered 2019-11-24: 12.5 mg via INTRAVENOUS
  Filled 2019-11-24: qty 1

## 2019-11-24 MED ORDER — BISACODYL 5 MG PO TBEC: 5.0000 mg | DELAYED_RELEASE_TABLET | Freq: Every day | ORAL | Status: DC | PRN

## 2019-11-24 MED ORDER — PANTOPRAZOLE SODIUM 40 MG PO TBEC
40.0000 mg | DELAYED_RELEASE_TABLET | Freq: Two times a day (BID) | ORAL | Status: DC
  Administered 2019-11-24 – 2019-11-25 (×3): 40 mg via ORAL
  Filled 2019-11-24 (×3): qty 1

## 2019-11-24 MED ORDER — ESOMEPRAZOLE MAGNESIUM 20 MG PO TBEC: 40.0000 mg | DELAYED_RELEASE_TABLET | Freq: Two times a day (BID) | ORAL | Status: DC

## 2019-11-24 MED ORDER — METHYLPREDNISOLONE SODIUM SUCC 125 MG IJ SOLR
60.0000 mg | Freq: Two times a day (BID) | INTRAMUSCULAR | Status: DC
  Administered 2019-11-24 – 2019-11-25 (×3): 60 mg via INTRAVENOUS
  Filled 2019-11-24 (×3): qty 2

## 2019-11-24 MED ORDER — SODIUM CHLORIDE 0.9 % IV BOLUS
1000.0000 mL | Freq: Once | INTRAVENOUS | Status: AC
  Administered 2019-11-24: 1000 mL via INTRAVENOUS

## 2019-11-24 MED ORDER — SODIUM CHLORIDE 0.9 % IV SOLN: 100.0000 mg | Freq: Every day | INTRAVENOUS | Status: DC

## 2019-11-24 MED ORDER — SODIUM CHLORIDE 0.9% FLUSH
3.0000 mL | Freq: Two times a day (BID) | INTRAVENOUS | Status: DC
  Administered 2019-11-24 – 2019-11-25 (×3): 3 mL via INTRAVENOUS

## 2019-11-24 MED ORDER — POLYETHYLENE GLYCOL 3350 17 G PO PACK: 17.0000 g | PACK | Freq: Every day | ORAL | Status: DC | PRN

## 2019-11-24 MED ORDER — PREDNISONE 20 MG PO TABS: 50.0000 mg | ORAL_TABLET | Freq: Every day | ORAL | Status: DC

## 2019-11-24 MED ORDER — FLEET ENEMA 7-19 GM/118ML RE ENEM: 1.0000 | ENEMA | Freq: Once | RECTAL | Status: DC | PRN

## 2019-11-24 MED ORDER — SODIUM CHLORIDE 0.9 % IV SOLN: 250.0000 mL | INTRAVENOUS | Status: DC | PRN

## 2019-11-24 MED ORDER — ACETAMINOPHEN 325 MG PO TABS
650.0000 mg | ORAL_TABLET | Freq: Four times a day (QID) | ORAL | Status: DC | PRN
  Administered 2019-11-25: 650 mg via ORAL
  Filled 2019-11-24: qty 2

## 2019-11-24 MED ORDER — GUAIFENESIN-DM 100-10 MG/5ML PO SYRP: 10.0000 mL | ORAL_SOLUTION | ORAL | Status: DC | PRN

## 2019-11-24 MED ORDER — SODIUM CHLORIDE 0.9 % IV SOLN
100.0000 mg | Freq: Every day | INTRAVENOUS | Status: AC
  Administered 2019-11-24 – 2019-11-25 (×2): 100 mg via INTRAVENOUS
  Filled 2019-11-24 (×2): qty 20

## 2019-11-24 MED ORDER — METHYLPREDNISOLONE SODIUM SUCC 125 MG IJ SOLR: 0.5000 mg/kg | Freq: Two times a day (BID) | INTRAMUSCULAR | Status: DC

## 2019-11-24 MED ORDER — ALBUTEROL SULFATE HFA 108 (90 BASE) MCG/ACT IN AERS
2.0000 | INHALATION_SPRAY | RESPIRATORY_TRACT | Status: DC | PRN
  Filled 2019-11-24: qty 6.7

## 2019-11-24 MED ORDER — ONDANSETRON HCL 4 MG/2ML IJ SOLN
4.0000 mg | Freq: Four times a day (QID) | INTRAMUSCULAR | Status: DC | PRN
  Filled 2019-11-24: qty 2

## 2019-11-24 MED ORDER — ZINC SULFATE 220 (50 ZN) MG PO CAPS
220.0000 mg | ORAL_CAPSULE | Freq: Every day | ORAL | Status: DC
  Administered 2019-11-24 – 2019-11-25 (×2): 220 mg via ORAL
  Filled 2019-11-24 (×2): qty 1

## 2019-11-24 MED ORDER — SODIUM CHLORIDE 0.9 % IV SOLN
200.0000 mg | Freq: Once | INTRAVENOUS | Status: DC
  Filled 2019-11-24: qty 40

## 2019-11-24 MED ORDER — ASCORBIC ACID 500 MG PO TABS
500.0000 mg | ORAL_TABLET | Freq: Every day | ORAL | Status: DC
  Administered 2019-11-24 – 2019-11-25 (×2): 500 mg via ORAL
  Filled 2019-11-24 (×2): qty 1

## 2019-11-24 MED ORDER — ONDANSETRON HCL 4 MG PO TABS: 4.0000 mg | ORAL_TABLET | Freq: Four times a day (QID) | ORAL | Status: DC | PRN

## 2019-11-24 MED ORDER — SODIUM CHLORIDE 0.9% FLUSH: 3.0000 mL | INTRAVENOUS | Status: DC | PRN

## 2019-11-24 NOTE — ED Notes (Signed)
Attempted report x1. 

## 2019-11-24 NOTE — Progress Notes (Signed)
Patient alert and oriented, sitting in bed. Patient with no complaints at this time. NAD noted.

## 2019-11-24 NOTE — ED Triage Notes (Signed)
Per ems pt tested positive fore covid last Wednesday and was recently discharged from [place] on yesterday at 3pm. Pt went to throw up and had a syncopal episode in front of the toilet that was witnessed by husband. Per husband pts  Eyes rolls to the back of her head and she went unresponsive. Pt was axox4 upon ems arrival. Vitals were wnl and o n the way to the hospital pt stated she had to throw up again but before she could she passed out again. Per ems pt hr went to 20s and then agter being sternal rubbed she woke up and her vitals went back to WNL.

## 2019-11-24 NOTE — Progress Notes (Signed)
  Echocardiogram 2D Echocardiogram limited has been performed.  Leta Jungling M 11/24/2019, 10:13 AM

## 2019-11-24 NOTE — H&P (Signed)
History and Physical    Judith Bentley:[id number] DOB: [birth year] DOA: 11/24/2019  PCP: Patient, No Pcp Per Consultants:  GI Patient coming from:  Home - lives with husband and [address]; NOK: Judith, Bentley, [phone]  Chief Complaint: syncope  HPI: Judith Bentley is a 44 y.o. female with no significant medical history presenting with syncope.  She was previously hospitalized from 10/1-4 with acute COVID-19 viral pneumonitis.  She presented for that hospitalization with the same complaint.  She was treated with steroids and Remdesivir with plan for outpatient Remdesivir infusion.  She reports that she did not feel better at the time of discharge.  She went home and still had fever with nausea and some vomiting.  She feels like she has something stuck in her chest.  Overall, she felt terrible and passed out again and so she came back.  Her husband reports that she has problems with emesis at baseline.  She had an EGD about a month ago.  She is on Nexium BID.  She has episodic bouts of emesis where she lies in bed for several days until it resolves.    ED Course:  Carryover, per Dr. Loney Loh:  Patient was discharged from the hospital yesterday after being treated for Covid pneumonitis. She was in the hospital for 4 days. Went home and had a syncopal episode. It is reported that she vomited at home and then became unresponsive. When EMS arrived she was awake but then had another episode of vomiting with EMS and soon after became unresponsive. EMS gave her sternal rubs and noted that she was bradycardic with heart rate down to the 20s. Vitals stable since arrival to the ED. EKG showing sinus rhythm. Plan was to discharge her from the ED after giving an antiemetic and fluid bolus. However, patient is extremely weak and can barely even sit up in the bed to be examined. Orthostatics cannot be done at this time. It seems she had a syncopal event prior to her last  hospitalization as well. Head CT done at that time was negative. Admitted for observation.   Review of Systems: As per HPI; otherwise review of systems reviewed and negative.   Ambulatory Status:  Ambulates without assistance  COVID Vaccine Status:  None   Past Medical History:  Diagnosis Date  . COVID-19     Past Surgical History:  Procedure Laterality Date  . CESAREAN SECTION    . CHOLECYSTECTOMY      Social History   Socioeconomic History  . Marital status: Married    Spouse name: Not on file  . Number of children: Not on file  . Years of education: Not on file  . Highest education level: Not on file  Occupational History  . Occupation: housewife  Tobacco Use  . Smoking status: Never Smoker  . Smokeless tobacco: Never Used  Substance and Sexual Activity  . Alcohol use: Not Currently  . Drug use: Not Currently  . Sexual activity: Not on file  Other Topics Concern  . Not on file  Social History Narrative  . Not on file   Social Determinants of Health   Financial Resource Strain:   . Difficulty of Paying Living Expenses: Not on file  Food Insecurity:   . Worried About Programme researcher, broadcasting/film/video in the Last Year: Not on file  . Ran Out of Food in the Last Year: Not on file  Transportation Needs:   . Lack of Transportation (Medical): Not on file  . Lack of  Transportation (Non-Medical): Not on file  Physical Activity:   . Days of Exercise per Week: Not on file  . Minutes of Exercise per Session: Not on file  Stress:   . Feeling of Stress : Not on file  Social Connections:   . Frequency of Communication with Friends and Family: Not on file  . Frequency of Social Gatherings with Friends and Family: Not on file  . Attends Religious Services: Not on file  . Active Member of Clubs or Organizations: Not on file  . Attends Banker Meetings: Not on file  . Marital Status: Not on file  Intimate Partner Violence:   . Fear of Current or Ex-Partner: Not on  file  . Emotionally Abused: Not on file  . Physically Abused: Not on file  . Sexually Abused: Not on file    Allergies  Allergen Reactions  . Codeine Nausea And Vomiting    Family History  Problem Relation Age of Onset  . Diabetes Mellitus II Maternal Grandfather     Prior to Admission medications   Medication Sig Start Date End Date Taking? Authorizing Provider  albuterol (VENTOLIN HFA) 108 (90 Base) MCG/ACT inhaler Inhale 2 puffs into the lungs every 6 (six) hours as needed for wheezing or shortness of breath. 11/23/19   Leroy Sea, MD  ascorbic acid (VITAMIN C) 500 MG tablet Take 500 mg by mouth daily.    [provider]  Esomeprazole Magnesium (NEXIUM 24HR) 20 MG TBEC Take 40 mg by mouth 2 (two) times daily. 11/23/19   Leroy Sea, MD  methylPREDNISolone (MEDROL DOSEPAK) 4 MG TBPK tablet follow package directions 11/23/19   Leroy Sea, MD  promethazine (PHENERGAN) 25 MG tablet Take 1 tablet (25 mg total) by mouth every 8 (eight) hours as needed for nausea or vomiting. 11/23/19   Leroy Sea, MD    Physical Exam: Vitals:   11/24/19 0515 11/24/19 0600 11/24/19 0608 11/24/19 0651  BP: 123/79 138/82  116/75  Pulse: 89 86  85  Resp: 12 17  18   Temp:   98.6 F (37 C) 98.5 F (36.9 C)  TempSrc:   Oral Oral  SpO2: 98% 96%  94%     . General:  Appears calm and comfortable and is NAD, appears lethargic and sedentary . Eyes:   EOMI, normal lids, iris . ENT:  grossly normal hearing, lips & tongue, mildly dry mm; appropriate dentition . Neck:  no LAD, masses or thyromegaly . Cardiovascular:  RRR, no m/r/g. No LE edema.  Respiratory:   CTA bilaterally with no wheezes/rales/rhonchi.  Normal respiratory effort. . Abdomen:  soft, NT, ND, NABS . Skin:  no rash or induration seen on limited exam . Musculoskeletal:  grossly normal tone BUE/BLE, good ROM, no bony abnormality . Psychiatric:  Blunted mood and affect, speech fluent and appropriate,  AOx3 . Neurologic:  CN 2-12 grossly intact, moves all extremities in coordinated fashion    Radiological Exams on Admission: DG Abd 1 View  Result Date: 11/22/2019 CLINICAL DATA:  Nausea, vomiting. EXAM: ABDOMEN - 1 VIEW COMPARISON:  September 07, 2019. FINDINGS: The bowel gas pattern is normal. No radio-opaque calculi or other significant radiographic abnormality are seen. IMPRESSION: Negative. Electronically Signed   By: September 09, 2019 M.D.   On: 11/22/2019 14:28    EKG: Independently reviewed.  NSR with rate 87; no evidence of acute ischemia   Labs on Admission: I have personally reviewed the available labs and imaging studies  at the time of the admission.  Pertinent labs:   K+ 3.1 Glucose 122 AST 94/ALT 98 WBC 3.0 Hgb 11.0   Assessment/Plan Principal Problem:   Syncope Active Problems:   Pneumonia due to COVID-19 virus   Class 2 obesity due to excess calories with body mass index (BMI) of 38.0 to 38.9 in adult   Cyclical vomiting syndrome   Syncope due to COVID-19 Infection -Patient with recent hospitalization for COVID-19 PNA -She was discharged on 10/4 and does not feel like she was ready to go home -She continued to feel poorly after d/c and had another syncopal episode and so returned -She does not have a current O2 requirement - sats only as low as 92% documented -COVID POSITIVE -The patient has comorbidities which may increase the risk for ARDS/MODS including:  obesity -Pertinent labs concerning for COVID include leukopenia; worsening increase in LFTs; other COVID labs are pending -Will admit for further evaluation, close monitoring, and treatment -Monitor on telemetry x at least 24 hours -At this time, will attempt to avoid use of aerosolized medications and use HFAs instead -Will check daily labs including BMP with Mag, Phos; LFTs; CBC with differential; CRP; ferritin; fibrinogen; D-dimer -Will resume steroids and Remdesivir - she is due for 2 additional days of  treatment -If the patient shows clinical deterioration, consider transfer to ICU with PCCM consultation -Consider Tocilizumab if the patient does not stabilize on current treatment or if the patient has marked clinical decompensation; the patient does not appear to require these treatments at this time. -Will attempt to maintain euvolemia to a net negative fluid status; she does appear mildly dehydrated with hypokalemia so will give 1L NS with KCl -Will ask the patient to maintain an awake prone position for 16+ hours a day, if possible, with a minimum of 2-3 hours at a time -PT consult, encourage ambulation -Patient was seen wearing full PPE including: gown, gloves, head cover, N95, and face shield; donning and doffing was in compliance with current standards.  Obesity -BMI 38 -Weight loss should be encouraged -Outpatient PCP/bariatric medicine/bariatric surgery f/u encouraged  Hyperglycemia -Likely steroid-induced -Will follow with fasting labs -Given her body habitus, she is at increased risk for DM  Cyclical vomiting -Patient with prior GI evaluation and recent EGD -AXR unremarkable -Overall, her labs and imaging appear benign at this time -Encourage a good bowel regimen as well as behavioral health support    DVT prophylaxis:  Lovenox  Code Status:  Full  Family Communication: None present; I spoke with the patient's husband by telephone. Disposition Plan:  The patient is from: home  Anticipated d/c is to: home without [facility] services once her respiratory issues have been resolved.  She may require home O2 at the time of discharge.  Anticipated d/c date will depend on clinical response to treatment, likely between 3 days (with completion of outpatient Remdesivir treatment) and 5 days  Patient is currently: acutely ill Consults called: None  Admission status: Admit - It is my clinical opinion that admission to INPATIENT is reasonable and necessary because of the expectation  that this patient will require hospital care that crosses at least 2 midnights to treat this condition based on the medical complexity of the problems presented.  Given the aforementioned information, the predictability of an adverse outcome is felt to be significant.       Jonah Blue MD [facility]   How to contact the [facility] Attending or Consulting provider 7A - 7P or covering  provider during after hours 7P -7A, for this patient?  1. Check the care team in [facility] and look for a) attending/consulting [facility] provider listed and b) the [facility] team listed 2. Log into [email or web address] and use [place]'s universal password to access. If you do not have the password, please contact the hospital operator. 3. Locate the [facility] provider you are looking for under [facility] and page to a number that you can be directly reached. 4. If you still have difficulty reaching the provider, please page the [facility] (Director on Call) for the Hospitalists listed on amion for assistance.   11/24/2019, 8:32 AM

## 2019-11-24 NOTE — Progress Notes (Signed)
Patient sitting in chair, alert and oriented. NAD noted at this time. No complaints at this time

## 2019-11-24 NOTE — ED Provider Notes (Signed)
[facility] EMERGENCY DEPARTMENT Provider Note   CSN: [id number] Arrival date & time: 11/24/19  0240     History Chief Complaint  Patient presents with  . Covid Positive  . Loss of Consciousness    Judith Bentley is a 44 y.o. female.  Patient is a 44 year old female with history of obesity, anemia, and recent hospitalization for COVID-19.  Patient was discharged yesterday after receiving treatment for COVID-19.  This evening, she apparently became nauseated and had to vomit.  She then had a syncopal episode in the bathroom in front of the toilet.  Her husband called 911.  Patient was awake and alert when EMS arrived, then had another nausea/vomiting episode and again became unresponsive.  I am told that her heart rate dropped into the 20s, then improved with sternal rub.  Patient complaining of severe weakness, nausea, having no energy, and feeling overall very poorly.  The history is provided by the patient.  Loss of Consciousness Episode history:  Multiple Most recent episode:  Today Relieved by:  Nothing Worsened by:  Nothing Ineffective treatments:  None tried      No past medical history on file.  Patient Active Problem List   Diagnosis Date Noted  . Syncope 11/21/2019  . Anemia 11/21/2019  . Pneumonia due to COVID-19 virus 11/21/2019    Past Surgical History:  Procedure Laterality Date  . CESAREAN SECTION    . CHOLECYSTECTOMY       OB History   No obstetric history on file.     Family History  Problem Relation Age of Onset  . Diabetes Mellitus II Maternal Grandfather     Social History   Tobacco Use  . Smoking status: Never Smoker  . Smokeless tobacco: Never Used  Substance Use Topics  . Alcohol use: Not on file  . Drug use: Not on file    Home Medications Prior to Admission medications   Medication Sig Start Date End Date Taking? Authorizing Provider  albuterol (VENTOLIN HFA) 108 (90 Base) MCG/ACT inhaler Inhale 2  puffs into the lungs every 6 (six) hours as needed for wheezing or shortness of breath. 11/23/19   Leroy Sea, MD  ascorbic acid (VITAMIN C) 500 MG tablet Take 500 mg by mouth daily.    [provider]  Esomeprazole Magnesium (NEXIUM 24HR) 20 MG TBEC Take 40 mg by mouth 2 (two) times daily. 11/23/19   Leroy Sea, MD  methylPREDNISolone (MEDROL DOSEPAK) 4 MG TBPK tablet follow package directions 11/23/19   Leroy Sea, MD  promethazine (PHENERGAN) 25 MG tablet Take 1 tablet (25 mg total) by mouth every 8 (eight) hours as needed for nausea or vomiting. 11/23/19   Leroy Sea, MD    Allergies    Codeine  Review of Systems   Review of Systems  Cardiovascular: Positive for syncope.  All other systems reviewed and are negative.   Physical Exam Updated Vital Signs BP 117/62 (BP Location: Right Arm)   Pulse 88   Resp 20   SpO2 92%   Physical Exam Vitals and nursing note reviewed.  Constitutional:      General: She is not in acute distress.    Appearance: She is well-developed. She is not diaphoretic.     Comments: Patient is somewhat pale appearing and appears uncomfortable.  She is moaning and somnolent.  HENT:     Head: Normocephalic and atraumatic.  Cardiovascular:     Rate and Rhythm: Normal rate and regular rhythm.  Heart sounds: No murmur heard.  No friction rub. No gallop.   Pulmonary:     Effort: Pulmonary effort is normal. No respiratory distress.     Breath sounds: Normal breath sounds. No wheezing.  Abdominal:     General: Bowel sounds are normal. There is no distension.     Palpations: Abdomen is soft.     Tenderness: There is no abdominal tenderness.  Musculoskeletal:        General: Normal range of motion.     Cervical back: Normal range of motion and neck supple.  Skin:    General: Skin is warm and dry.  Neurological:     Mental Status: She is alert and oriented to person, place, and time.     ED Results / Procedures /  Treatments   Labs (all labs ordered are listed, but only abnormal results are displayed) Labs Reviewed  COMPREHENSIVE METABOLIC PANEL  LIPASE, BLOOD  CBC WITH DIFFERENTIAL/PLATELET    EKG EKG Interpretation  Date/Time:  Tuesday November 24 2019 02:45:58 EDT Ventricular Rate:  87 PR Interval:    QRS Duration: 82 QT Interval:  348 QTC Calculation: 419 R Axis:   20 Text Interpretation: Sinus rhythm Normal ECG No significant change since 11/22/2019 Confirmed by Geoffery Lyons ([id number]) on 11/24/2019 2:59:57 AM   Radiology DG Abd 1 View  Result Date: 11/22/2019 CLINICAL DATA:  Nausea, vomiting. EXAM: ABDOMEN - 1 VIEW COMPARISON:  September 07, 2019. FINDINGS: The bowel gas pattern is normal. No radio-opaque calculi or other significant radiographic abnormality are seen. IMPRESSION: Negative. Electronically Signed   By: Lupita Raider M.D.   On: 11/22/2019 14:28    Procedures Procedures (including critical care time)  Medications Ordered in ED Medications  sodium chloride 0.9 % bolus 1,000 mL (has no administration in time range)  promethazine (PHENERGAN) injection 12.5 mg (has no administration in time range)    ED Course  I have reviewed the triage vital signs and the nursing notes.  Pertinent labs & imaging results that were available during my care of the patient were reviewed by me and considered in my medical decision making (see chart for details).    MDM Rules/Calculators/A&P  Patient with history of obesity and anemia with recent admission for COVID-19.  She was discharged yesterday.  Since returning home, she began vomiting, then became syncopal.  A second episode occurred with EMS and patient was said to have bradycardia down to the heart rate of 20.  I have no documentation of this on the rhythm strip.  Patient's vital signs are stable.  She was given IV fluids and Phenergan, but continues to feel weak and nauseated.  Patient unable to sit up for me to listen to her lungs  much less stand and walk.  At this point, I do not feel she can safely go home.  I have spoken with Dr. Loney Loh from the hospitalist service who will evaluate and likely admit.  Final Clinical Impression(s) / ED Diagnoses Final diagnoses:  None    Rx / DC Orders ED Discharge Orders    None       Geoffery Lyons, MD 11/24/19 [phone]

## 2019-11-24 NOTE — Progress Notes (Signed)
Patient sitting in bed, alert and oriented. Patient reporting having nausea, but refusing Zofran. Patient has no emesis at this time. NAD noted.

## 2019-11-24 NOTE — Progress Notes (Signed)
Patient lying in bed asleep. NAD noted at this time.

## 2019-11-25 ENCOUNTER — Ambulatory Visit (HOSPITAL_COMMUNITY): Payer: 59

## 2019-11-25 LAB — COMPREHENSIVE METABOLIC PANEL
ALT: 70 U/L — ABNORMAL HIGH (ref 0–44)
AST: 30 U/L (ref 15–41)
Albumin: 3 g/dL — ABNORMAL LOW (ref 3.5–5.0)
Alkaline Phosphatase: 76 U/L (ref 38–126)
Anion gap: 12 (ref 5–15)
BUN: 7 mg/dL (ref 6–20)
CO2: 28 mmol/L (ref 22–32)
Calcium: 8.6 mg/dL — ABNORMAL LOW (ref 8.9–10.3)
Chloride: 103 mmol/L (ref 98–111)
Creatinine, Ser: 0.58 mg/dL (ref 0.44–1.00)
GFR calc non Af Amer: >60 mL/min (ref >60)
Glucose, Bld: 161 mg/dL — ABNORMAL HIGH (ref 70–99)
Potassium: 3.9 mmol/L (ref 3.5–5.1)
Sodium: 143 mmol/L (ref 135–145)
Total Bilirubin: 0.5 mg/dL (ref 0.3–1.2)
Total Protein: 6.3 g/dL — ABNORMAL LOW (ref 6.5–8.1)

## 2019-11-25 LAB — CBC WITH DIFFERENTIAL/PLATELET
Abs Immature Granulocytes: 0.07 10*3/uL (ref 0.00–0.07)
Basophils Absolute: 0 10*3/uL (ref 0.0–0.1)
Basophils Relative: 0 %
Eosinophils Absolute: 0 10*3/uL (ref 0.0–0.5)
Eosinophils Relative: 0 %
HCT: 33.8 % — ABNORMAL LOW (ref 36.0–46.0)
Hemoglobin: 10.5 g/dL — ABNORMAL LOW (ref 12.0–15.0)
Immature Granulocytes: 3 %
Lymphocytes Relative: 27 %
Lymphs Abs: 0.6 10*3/uL — ABNORMAL LOW (ref 0.7–4.0)
MCH: 24.5 pg — ABNORMAL LOW (ref 26.0–34.0)
MCHC: 31.1 g/dL (ref 30.0–36.0)
MCV: 78.8 fL — ABNORMAL LOW (ref 80.0–100.0)
Monocytes Absolute: 0.1 10*3/uL (ref 0.1–1.0)
Monocytes Relative: 6 %
Neutro Abs: 1.3 10*3/uL — ABNORMAL LOW (ref 1.7–7.7)
Neutrophils Relative %: 64 %
Platelets: 204 10*3/uL (ref 150–400)
RBC: 4.29 MIL/uL (ref 3.87–5.11)
RDW: 15 % (ref 11.5–15.5)
WBC: 2.1 10*3/uL — ABNORMAL LOW (ref 4.0–10.5)
nRBC: 0 % (ref 0.0–0.2)

## 2019-11-25 LAB — FERRITIN: Ferritin: 44 ng/mL (ref 11–307)

## 2019-11-25 LAB — D-DIMER, QUANTITATIVE: D-Dimer, Quant: 0.42 ug/mL-FEU (ref 0.00–0.50)

## 2019-11-25 LAB — C-REACTIVE PROTEIN: CRP: 2.5 mg/dL — ABNORMAL HIGH (ref <1.0)

## 2019-11-25 LAB — MAGNESIUM: Magnesium: 2.4 mg/dL (ref 1.7–2.4)

## 2019-11-25 LAB — PHOSPHORUS: Phosphorus: 3 mg/dL (ref 2.5–4.6)

## 2019-11-25 MED ORDER — BENZONATATE 100 MG PO CAPS: 100.0000 mg | ORAL_CAPSULE | Freq: Three times a day (TID) | ORAL | 0 refills | Status: DC | PRN

## 2019-11-25 NOTE — Discharge Summary (Signed)
Judith Bentley, is a 44 y.o. female  DOB 11/15/1975  MRN [id number].  Admission date:  11/24/2019  Admitting Physician  Jonah Blue, MD  Discharge Date:  11/25/2019   Primary MD  Patient, No Pcp Per  Recommendations for primary care physician for things to follow:  -Check CBC, CMP during next visit.   Admission Diagnosis  Syncope [R55] Syncope, unspecified syncope type [R55] Nausea and vomiting, intractability of vomiting not specified, unspecified vomiting type [R11.2] COVID-19 virus infection [U07.1]   Discharge Diagnosis  Syncope [R55] Syncope, unspecified syncope type [R55] Nausea and vomiting, intractability of vomiting not specified, unspecified vomiting type [R11.2] COVID-19 virus infection [U07.1]    Principal Problem:   Syncope Active Problems:   Pneumonia due to COVID-19 virus   Class 2 obesity due to excess calories with body mass index (BMI) of 38.0 to 38.9 in adult   Cyclical vomiting syndrome      Past Medical History:  Diagnosis Date  . COVID-19   . Cyclical vomiting syndrome     Past Surgical History:  Procedure Laterality Date  . CESAREAN SECTION    . CHOLECYSTECTOMY         History of present illness and  Hospital Course:     Kindly see H&P for history of present illness and admission details, please review complete Labs, Consult reports and Test reports for all details in brief  HPI  from the history and physical done on the day of admission 11/24/2019   Judith Bentley is a 44 y.o. female with no significant medical history presenting with syncope.  She was previously hospitalized from 10/1-4 with acute COVID-19 viral pneumonitis.  She presented for that hospitalization with the same complaint.  She was treated with steroids and Remdesivir with plan for outpatient Remdesivir infusion.  She reports that she did not feel better at the time of discharge.   She went home and still had fever with nausea and some vomiting.  She feels like she has something stuck in her chest.  Overall, she felt terrible and passed out again and so she came back.  Her husband reports that she has problems with emesis at baseline.  She had an EGD about a month ago.  She is on Nexium BID.  She has episodic bouts of emesis where she lies in bed for several days until it resolves.   Hospital Course   Syncope -Most likely vasovagal related to cough, she was admitted overnight, monitored on telemetry with no significant event, she was not orthostatic this morning as well, any chest pain or dyspnea, but still reports cough, 2D echo with a preserved EF, grade 2 diastolic dysfunction, right ventricular systolic function within normal limit, size is normal, no significant valvular disease, D-dimers within normal limit, no hypoxia.  Covid 19 infection -She is to continue her steroids taper on recent discharge.  Obesity -BMI 38  Cyclical vomiting -Patient with prior GI evaluation and recent EGD -AXR unremarkable  Discharge Condition:  stable    Discharge Instructions  and  Discharge Medications    Discharge Instructions    Diet - low sodium heart healthy   Complete by: As directed    Discharge instructions   Complete by: As directed    Follow with Primary MD   Get CBC, CMP,  checked  by Primary MD next visit.    Activity: As tolerated with Full fall precautions use walker/cane & assistance as needed   Disposition Home    Diet: Heart Healthy  On your next visit with your primary care physician please Get Medicines reviewed and adjusted.   Please request your Prim.MD to go over all Hospital Tests and Procedure/Radiological results at the follow up, please get all Hospital records sent to your Prim MD by signing hospital release before you go home.   If you experience worsening of your admission symptoms, develop shortness of breath, life  threatening emergency, suicidal or homicidal thoughts you must seek medical attention immediately by calling 911 or calling your MD immediately  if symptoms less severe.  You Must read complete instructions/literature along with all the possible adverse reactions/side effects for all the Medicines you take and that have been prescribed to you. Take any new Medicines after you have completely understood and accpet all the possible adverse reactions/side effects.   Do not drive, operating heavy machinery, perform activities at heights, swimming or participation in water activities or provide baby sitting services if your were admitted for syncope or siezures until you have seen by Primary MD or a Neurologist and advised to do so again.  Do not drive when taking Pain medications.    Do not take more than prescribed Pain, Sleep and Anxiety Medications  Special Instructions: If you have smoked or chewed Tobacco  in the last 2 yrs please stop smoking, stop any regular Alcohol  and or any Recreational drug use.  Wear Seat belts while driving.   Please note  You were cared for by a hospitalist during your hospital stay. If you have any questions about your discharge medications or the care you received while you were in the hospital after you are discharged, you can call the unit and asked to speak with the hospitalist on call if the hospitalist that took care of you is not available. Once you are discharged, your primary care physician will handle any further medical issues. Please note that NO REFILLS for any discharge medications will be authorized once you are discharged, as it is imperative that you return to your primary care physician (or establish a relationship with a primary care physician if you do not have one) for your aftercare needs so that they can reassess your need for medications and monitor your lab values.   Increase activity slowly   Complete by: As directed    Increase activity  slowly   Complete by: As directed    MyChart COVID-19 home monitoring program   Complete by: Nov 25, 2019    Is the patient willing to use the MyChart Mobile App for home monitoring?: Yes   Temperature monitoring   Complete by: Nov 25, 2019    After how many days would you like to receive a notification of this patient's flowsheet entries?: 1     Allergies as of 11/25/2019      Reactions   Codeine Nausea And Vomiting      Medication List    TAKE these medications   albuterol 108 (90 Base) MCG/ACT inhaler Commonly known as: VENTOLIN HFA Inhale 2 puffs  into the lungs every 6 (six) hours as needed for wheezing or shortness of breath.   ascorbic acid 500 MG tablet Commonly known as: VITAMIN C Take 500 mg by mouth daily.   benzonatate 100 MG capsule Commonly known as: Tessalon Perles Take 1 capsule (100 mg total) by mouth 3 (three) times daily as needed for cough.   methylPREDNISolone 4 MG Tbpk tablet Commonly known as: MEDROL DOSEPAK follow package directions   NexIUM 24HR 20 MG Tbec Generic drug: Esomeprazole Magnesium Take 40 mg by mouth 2 (two) times daily.   promethazine 25 MG tablet Commonly known as: PHENERGAN Take 1 tablet (25 mg total) by mouth every 8 (eight) hours as needed for nausea or vomiting.         Diet and Activity recommendation: See Discharge Instructions above   Consults obtained -  none   Major procedures and Radiology Reports - PLEASE review detailed and final reports for all details, in brief -     DG Abd 1 View  Result Date: 11/22/2019 CLINICAL DATA:  Nausea, vomiting. EXAM: ABDOMEN - 1 VIEW COMPARISON:  September 07, 2019. FINDINGS: The bowel gas pattern is normal. No radio-opaque calculi or other significant radiographic abnormality are seen. IMPRESSION: Negative. Electronically Signed   By: Lupita RaiderJames  Green Jr M.D.   On: 11/22/2019 14:28   CT HEAD WO CONTRAST  Result Date: 11/21/2019 CLINICAL DATA:  Mental status change.  No reported  injury. EXAM: CT HEAD WITHOUT CONTRAST TECHNIQUE: Contiguous axial images were obtained from the base of the skull through the vertex without intravenous contrast. COMPARISON:  10/05/2010 head CT. FINDINGS: Brain: No evidence of parenchymal hemorrhage or extra-axial fluid collection. No mass lesion, mass effect, or midline shift. No CT evidence of acute infarction. Cerebral volume is age appropriate. No ventriculomegaly. Vascular: No acute abnormality. Skull: No evidence of calvarial fracture. Sinuses/Orbits: No fluid levels. Mild mucoperiosteal thickening throughout the visualized paranasal sinuses bilaterally. Other:  The mastoid air cells are unopacified. IMPRESSION: 1. No evidence of acute intracranial abnormality. 2. Mild chronic appearing paranasal sinusitis. Electronically Signed   By: Delbert PhenixJason A Poff M.D.   On: 11/21/2019 05:09   DG Chest Port 1 View  Result Date: 11/20/2019 CLINICAL DATA:  Cough COVID EXAM: PORTABLE CHEST 1 VIEW COMPARISON:  None. FINDINGS: Patchy basilar airspace disease. No pleural effusion. Normal heart size. No pneumothorax. IMPRESSION: Patchy basilar airspace disease suspicious for pneumonia. Electronically Signed   By: Jasmine PangKim  Fujinaga M.D.   On: 11/20/2019 22:40   CT RENAL STONE STUDY  Result Date: 11/21/2019 CLINICAL DATA:  Left lower abdominal pain, nausea and vomiting. COVID positive. Syncopal episode. EXAM: CT ABDOMEN AND PELVIS WITHOUT CONTRAST TECHNIQUE: Multidetector CT imaging of the abdomen and pelvis was performed following the standard protocol without IV contrast. COMPARISON:  09/07/2019 CT chest, abdomen and pelvis. FINDINGS: Lower chest: Patchy ground-glass opacity and peripheral consolidation at both lung bases, new. Hepatobiliary: Normal liver size. No liver mass. Cholecystectomy. No biliary ductal dilatation. Pancreas: Normal, with no mass or duct dilation. Spleen: Normal size. No mass. Adrenals/Urinary Tract: Normal adrenals. No renal stones. No hydronephrosis. No  contour deforming renal masses. Normal bladder. Stomach/Bowel: Normal non-distended stomach. Normal caliber small bowel with no small bowel wall thickening. Normal appendix. Normal large bowel with no diverticulosis, large bowel wall thickening or pericolonic fat stranding. Vascular/Lymphatic: Normal caliber abdominal aorta. No pathologically enlarged lymph nodes in the abdomen or pelvis. Reproductive: Grossly normal uterus.  No adnexal mass. Other: No pneumoperitoneum, ascites or focal fluid collection.  Small fat containing umbilical hernia. Musculoskeletal: No aggressive appearing focal osseous lesions. Mild thoracolumbar spondylosis. IMPRESSION: 1. Patchy ground-glass opacity and peripheral consolidation at both lung bases, new, compatible with COVID-19 pneumonia. 2. No acute abnormality in the abdomen or pelvis. No evidence of bowel obstruction or acute bowel inflammation. Normal appendix. 3. Small fat containing umbilical hernia. Electronically Signed   By: Delbert Phenix M.D.   On: 11/21/2019 08:20   ECHOCARDIOGRAM LIMITED  Result Date: 11/24/2019    ECHOCARDIOGRAM LIMITED REPORT   Patient Name:   Judith Bentley Date of Exam: 11/24/2019 Medical Rec #:  [id number]          Height:       68.0 in Accession #:    [id number]         Weight:       250.0 lb Date of Birth:  [birth year]          BSA:          2.247 m Patient Age:    44 years           BP:           124/89 mmHg Patient Gender: F                  HR:           84 bpm. Exam Location:  Inpatient Procedure: Limited Echo, Limited Color Doppler and Cardiac Doppler Indications:    CHF-Acute Diastolic 428.31 / I50.31  History:        Patient has no prior history of Echocardiogram examinations.                 COVID 19 pneumonia, nausea, vomiting, syncopal episode. anemia.  Sonographer:    Leta Jungling RDCS Referring Phys: Heide Scales [facility]  Sonographer Comments: Image acquisition challenging due to respiratory motion. IMPRESSIONS  1. Left ventricular  ejection fraction, by estimation, is 60 to 65%. The left ventricle has normal function. The left ventricle has no regional wall motion abnormalities. Left ventricular diastolic parameters are consistent with Grade II diastolic dysfunction (pseudonormalization).  2. Right ventricular systolic function is normal. The right ventricular size is normal. There is normal pulmonary artery systolic pressure.  3. The mitral valve is normal in structure. No evidence of mitral valve regurgitation. No evidence of mitral stenosis.  4. The aortic valve was not well visualized. Aortic valve regurgitation is not visualized. No aortic stenosis is present.  5. The inferior vena cava is normal in size with greater than 50% respiratory variability, suggesting right atrial pressure of 3 mmHg. FINDINGS  Left Ventricle: Left ventricular ejection fraction, by estimation, is 60 to 65%. The left ventricle has normal function. The left ventricle has no regional wall motion abnormalities. The left ventricular internal cavity size was normal in size. There is  no left ventricular hypertrophy. Left ventricular diastolic parameters are consistent with Grade II diastolic dysfunction (pseudonormalization). Right Ventricle: The right ventricular size is normal. No increase in right ventricular wall thickness. Right ventricular systolic function is normal. There is normal pulmonary artery systolic pressure. The tricuspid regurgitant velocity is 2.43 m/s, and  with an assumed right atrial pressure of 3 mmHg, the estimated right ventricular systolic pressure is 26.6 mmHg. Left Atrium: Left atrial size was normal in size. Right Atrium: Right atrial size was normal in size. Pericardium: There is no evidence of pericardial effusion. Mitral Valve: The mitral valve is normal in structure. No evidence of mitral valve stenosis. Tricuspid  Valve: The tricuspid valve is normal in structure. Tricuspid valve regurgitation is mild . No evidence of tricuspid  stenosis. Aortic Valve: The aortic valve was not well visualized. Aortic valve regurgitation is not visualized. No aortic stenosis is present. Pulmonic Valve: The pulmonic valve was normal in structure. Pulmonic valve regurgitation is not visualized. No evidence of pulmonic stenosis. Aorta: The aortic root is normal in size and structure. Venous: The inferior vena cava is normal in size with greater than 50% respiratory variability, suggesting right atrial pressure of 3 mmHg. IAS/Shunts: No atrial level shunt detected by color flow Doppler. LEFT VENTRICLE PLAX 2D LVIDd:         4.50 cm  Diastology LVIDs:         3.00 cm  LV e' medial:    9.90 cm/s LV PW:         1.00 cm  LV E/e' medial:  6.9 LV IVS:        1.00 cm  LV e' lateral:   6.96 cm/s LVOT diam:     2.10 cm  LV E/e' lateral: 9.9 LV SV:         60 LV SV Index:   27 LVOT Area:     3.46 cm  LEFT ATRIUM         Index LA diam:    2.80 cm 1.25 cm/m  AORTIC VALVE LVOT Vmax:   110.00 cm/s LVOT Vmean:  69.400 cm/s LVOT VTI:    0.174 m  AORTA Ao Root diam: 3.10 cm Ao Asc diam:  3.40 cm MITRAL VALVE               TRICUSPID VALVE MV Area (PHT): 4.41 cm    TR Peak grad:   23.6 mmHg MV Decel Time: 172 msec    TR Vmax:        243.00 cm/s MV E velocity: 68.60 cm/s MV A velocity: 53.60 cm/s  SHUNTS MV E/A ratio:  1.28        Systemic VTI:  0.17 m                            Systemic Diam: 2.10 cm Chilton Si MD Electronically signed by Chilton Si MD Signature Date/Time: 11/24/2019/11:33:34 AM    Final     Micro Results    Recent Results (from the past 240 hour(s))  Respiratory Panel by RT PCR (Flu A&B, Covid) - Nasopharyngeal Swab     Status: Abnormal   Collection Time: 11/20/19 10:28 PM   Specimen: Nasopharyngeal Swab  Result Value Ref Range Status   SARS Coronavirus 2 by RT PCR POSITIVE (A) NEGATIVE Final    Comment: RESULT CALLED TO, READ BACK BY AND VERIFIED WITH: Alyse Low RN 11/20/19 AT 2352 SK (NOTE) SARS-CoV-2 target nucleic acids are  DETECTED.  SARS-CoV-2 RNA is generally detectable in upper respiratory specimens  during the acute phase of infection. Positive results are indicative of the presence of the identified virus, but do not rule out bacterial infection or co-infection with other pathogens not detected by the test. Clinical correlation with patient history and other diagnostic information is necessary to determine patient infection status. The expected result is Negative.  Fact Sheet for Patients:  https://www.moore.com/  Fact Sheet for Healthcare Providers: https://www.young.biz/  This test is not yet approved or cleared by the [place] FDA and  has been authorized for detection and/or diagnosis of SARS-CoV-2 by FDA under an Emergency  Use Authorization (EUA).  This EUA will remain in effect (meaning this test can be use d) for the duration of  the COVID-19 declaration under Section 564(b)(1) of the Act, 21 U.S.C. section 360bbb-3(b)(1), unless the authorization is terminated or revoked sooner.      Influenza A by PCR NEGATIVE NEGATIVE Final   Influenza B by PCR NEGATIVE NEGATIVE Final    Comment: (NOTE) The Xpert Xpress SARS-CoV-2/FLU/RSV assay is intended as an aid in  the diagnosis of influenza from Nasopharyngeal swab specimens and  should not be used as a sole basis for treatment. Nasal washings and  aspirates are unacceptable for Xpert Xpress SARS-CoV-2/FLU/RSV  testing.  Fact Sheet for Patients: https://www.moore.com/  Fact Sheet for Healthcare Providers: https://www.young.biz/  This test is not yet approved or cleared by the [place] FDA and  has been authorized for detection and/or diagnosis of SARS-CoV-2 by  FDA under an Emergency Use Authorization (EUA). This EUA will remain  in effect (meaning this test can be used) for the duration of the  Covid-19 declaration under Section 564(b)(1) of the Act,  21  U.S.C. section 360bbb-3(b)(1), unless the authorization is  terminated or revoked. Performed at [facility], [address] [place], [place] [address]   Culture, Urine     Status: Abnormal   Collection Time: 11/21/19  7:16 AM   Specimen: Urine, Random  Result Value Ref Range Status   Specimen Description URINE, RANDOM  Final   Special Requests NONE  Final   Culture (A)  Final    <10,000 COLONIES/mL INSIGNIFICANT GROWTH Performed at [facility], [address] [place], [place] [address]    Report Status 11/22/2019 FINAL  Final       Today   Subjective:   Judith Bentley today has no headache,no chest abdominal pain,no new weakness tingling or numbness, she does report some cough, I have encouraged her to do incentive spirometry keep using it at home , dizziness, no lightheadedness .  Objective:   Blood pressure 122/83, pulse 60, temperature 98 F (36.7 C), temperature source Oral, resp. rate 15, SpO2 95 %.   Intake/Output Summary (Last 24 hours) at 11/25/2019 1126 Last data filed at 11/25/2019 1029 Gross per 24 hour  Intake 1707.94 ml  Output --  Net 1707.94 ml    Exam Awake Alert, Oriented x 3, No new F.N deficits, Normal affect Symmetrical Chest wall movement, Good air movement bilaterally, CTAB RRR,No Gallops,Rubs or new Murmurs, No Parasternal Heave +ve B.Sounds, Abd Soft, Non tender,  No rebound -guarding or rigidity. No Cyanosis, Clubbing or edema, No new Rash or bruise  Patient was seen and examined with female chaperone her RN Greta  Data Review   CBC w Diff:  Lab Results  Component Value Date   WBC 2.1 (L) 11/25/2019   HGB 10.5 (L) 11/25/2019   HCT 33.8 (L) 11/25/2019   PLT 204 11/25/2019   LYMPHOPCT 27 11/25/2019   MONOPCT 6 11/25/2019   EOSPCT 0 11/25/2019   BASOPCT 0 11/25/2019    CMP:  Lab Results  Component Value Date   NA 143 11/25/2019   K 3.9 11/25/2019   CL 103 11/25/2019   CO2 28 11/25/2019   BUN 7  11/25/2019   CREATININE 0.58 11/25/2019   PROT 6.3 (L) 11/25/2019   ALBUMIN 3.0 (L) 11/25/2019   BILITOT 0.5 11/25/2019   ALKPHOS 76 11/25/2019   AST 30 11/25/2019   ALT 70 (H) 11/25/2019  .  Total Time in preparing paper work, data evaluation and todays exam - 35 minutes  Huey Bienenstock M.D on 11/25/2019 at 11:26 AM  [facility]   Office  [phone]

## 2019-11-25 NOTE — Discharge Instructions (Signed)
Person Under Monitoring Name: Judith Bentley  Location: [address] [place] [address]   Infection Prevention Recommendations for Individuals Confirmed to have, or Being Evaluated for, 2019 Novel Coronavirus (COVID-19) Infection Who Receive Care at Home  Individuals who are confirmed to have, or are being evaluated for, COVID-19 should follow the prevention steps below until a healthcare provider or local or state health department says they can return to normal activities.  Stay home except to get medical care You should restrict activities outside your home, except for getting medical care. Do not go to work, school, or public areas, and do not use public transportation or taxis.  Call ahead before visiting your doctor Before your medical appointment, call the healthcare provider and tell them that you have, or are being evaluated for, COVID-19 infection. This will help the healthcare provider's office take steps to keep other people from getting infected. Ask your healthcare provider to call the local or state health department.  Monitor your symptoms Seek prompt medical attention if your illness is worsening (e.g., difficulty breathing). Before going to your medical appointment, call the healthcare provider and tell them that you have, or are being evaluated for, COVID-19 infection. Ask your healthcare provider to call the local or state health department.  Wear a facemask You should wear a facemask that covers your nose and mouth when you are in the same room with other people and when you visit a healthcare provider. People who live with or visit you should also wear a facemask while they are in the same room with you.  Separate yourself from other people in your home As much as possible, you should stay in a different room from other people in your home. Also, you should use a separate bathroom, if available.  Avoid sharing household items You  should not share dishes, drinking glasses, cups, eating utensils, towels, bedding, or other items with other people in your home. After using these items, you should wash them thoroughly with soap and water.  Cover your coughs and sneezes Cover your mouth and nose with a tissue when you cough or sneeze, or you can cough or sneeze into your sleeve. Throw used tissues in a lined trash can, and immediately wash your hands with soap and water for at least 20 seconds or use an alcohol-based hand rub.  Wash your [employer] your hands often and thoroughly with soap and water for at least 20 seconds. You can use an alcohol-based hand sanitizer if soap and water are not available and if your hands are not visibly dirty. Avoid touching your eyes, nose, and mouth with unwashed hands.   Prevention Steps for Caregivers and Household Members of Individuals Confirmed to have, or Being Evaluated for, COVID-19 Infection Being Cared for in the Home  If you live with, or provide care at home for, a person confirmed to have, or being evaluated for, COVID-19 infection please follow these guidelines to prevent infection:  Follow healthcare provider's instructions Make sure that you understand and can help the patient follow any healthcare provider instructions for all care.  Provide for the patient's basic needs You should help the patient with basic needs in the home and provide support for getting groceries, prescriptions, and other personal needs.  Monitor the patient's symptoms If they are getting sicker, call his or her medical provider and tell them that the patient has, or is being evaluated for, COVID-19 infection. This will help the healthcare provider's  office take steps to keep other people from getting infected. Ask the healthcare provider to call the local or state health department.  Limit the number of people who have contact with the patient  If possible, have only one caregiver for the  patient.  Other household members should stay in another home or place of residence. If this is not possible, they should stay  in another room, or be separated from the patient as much as possible. Use a separate bathroom, if available.  Restrict visitors who do not have an essential need to be in the home.  Keep older adults, very young children, and other sick people away from the patient Keep older adults, very young children, and those who have compromised immune systems or chronic health conditions away from the patient. This includes people with chronic heart, lung, or kidney conditions, diabetes, and cancer.  Ensure good ventilation Make sure that shared spaces in the home have good air flow, such as from an air conditioner or an opened window, weather permitting.  Wash your hands often  Wash your hands often and thoroughly with soap and water for at least 20 seconds. You can use an alcohol based hand sanitizer if soap and water are not available and if your hands are not visibly dirty.  Avoid touching your eyes, nose, and mouth with unwashed hands.  Use disposable paper towels to dry your hands. If not available, use dedicated cloth towels and replace them when they become wet.  Wear a facemask and gloves  Wear a disposable facemask at all times in the room and gloves when you touch or have contact with the patient's blood, body fluids, and/or secretions or excretions, such as sweat, saliva, sputum, nasal mucus, vomit, urine, or feces.  Ensure the mask fits over your nose and mouth tightly, and do not touch it during use.  Throw out disposable facemasks and gloves after using them. Do not reuse.  Wash your hands immediately after removing your facemask and gloves.  If your personal clothing becomes contaminated, carefully remove clothing and launder. Wash your hands after handling contaminated clothing.  Place all used disposable facemasks, gloves, and other waste in a lined  container before disposing them with other household waste.  Remove gloves and wash your hands immediately after handling these items.  Do not share dishes, glasses, or other household items with the patient  Avoid sharing household items. You should not share dishes, drinking glasses, cups, eating utensils, towels, bedding, or other items with a patient who is confirmed to have, or being evaluated for, COVID-19 infection.  After the person uses these items, you should wash them thoroughly with soap and water.  Wash laundry thoroughly  Immediately remove and wash clothes or bedding that have blood, body fluids, and/or secretions or excretions, such as sweat, saliva, sputum, nasal mucus, vomit, urine, or feces, on them.  Wear gloves when handling laundry from the patient.  Read and follow directions on labels of laundry or clothing items and detergent. In general, wash and dry with the warmest temperatures recommended on the label.  Clean all areas the individual has used often  Clean all touchable surfaces, such as counters, tabletops, doorknobs, bathroom fixtures, toilets, phones, keyboards, tablets, and bedside tables, every day. Also, clean any surfaces that may have blood, body fluids, and/or secretions or excretions on them.  Wear gloves when cleaning surfaces the patient has come in contact with.  Use a diluted bleach solution (e.g., dilute bleach with 1  part bleach and 10 parts water) or a household disinfectant with a label that says EPA-registered for coronaviruses. To make a bleach solution at home, add 1 tablespoon of bleach to 1 quart (4 cups) of water. For a larger supply, add  cup of bleach to 1 gallon (16 cups) of water.  Read labels of cleaning products and follow recommendations provided on product labels. Labels contain instructions for safe and effective use of the cleaning product including precautions you should take when applying the product, such as wearing gloves or  eye protection and making sure you have good ventilation during use of the product.  Remove gloves and wash hands immediately after cleaning.  Monitor yourself for signs and symptoms of illness Caregivers and household members are considered close contacts, should monitor their health, and will be asked to limit movement outside of the home to the extent possible. Follow the monitoring steps for close contacts listed on the symptom monitoring form.   ? If you have additional questions, contact your local health department or call the epidemiologist on call at [phone] (available 24/7). ? This guidance is subject to change. For the most up-to-date guidance from [facility], please refer to their website: [email or web address]

## 2019-11-25 NOTE — Progress Notes (Addendum)
SATURATION QUALIFICATIONS: (This note is used to comply with regulatory documentation for home oxygen) ? ?Patient Saturations on Room Air at Rest = 97% ? ?Patient Saturations on Room Air while Ambulating = 93% ? ?Patient Saturations on 0 Liters of oxygen while Ambulating = 93% ? ?Please briefly explain why patient needs home oxygen: ?

## 2019-11-25 NOTE — Progress Notes (Signed)
Patient was discharged home by MD order; discharged instructions  review and give to patient with care notes; IV DIC; skin intact; patient will be escorted to the car by nurse tech via wheelchair.  

## 2019-11-25 NOTE — Evaluation (Signed)
Physical Therapy Evaluation Patient Details Name: Judith Bentley MRN: [id number] DOB: November 24, 1975 Today's Date: 11/25/2019   History of Present Illness  44 y.o. female with no significant medical history presenting with syncope.  She was previously hospitalized from 10/1-4 with acute COVID-19 viral pneumonitis.She went home and still had fever with nausea and some vomiting. EMS brought to ED, during transfer became unresponsive with HR down to 20's. Given fluids in ED and eventually admitted for syncope due to COVID and cyclical vomiting.  Clinical Impression  Prior to COVID pt was independent, however since initial hospitalization, pt continued to be independent in mobility, and ADLs required assist for iADLs. Pt currently mod I for bed mobility, and transfers and supervision for ambulation of 50 feet without AD. Pt SaO2 on RA >90%O2 with ambulation, however pt with 4/4 DoE requiring 2 minutes for HR to from 106 bpm, to low 90s. Pt educated on need for energy conservation and reliance on family. PT recommending HHPT for energy conservation and HEP. PT will continue to follow acutely.     Follow Up Recommendations Home health PT;Supervision for mobility/OOB    Equipment Recommendations  None recommended by PT       Precautions / Restrictions Restrictions Weight Bearing Restrictions: No      Mobility  Bed Mobility Overal bed mobility: Modified Independent             General bed mobility comments: with HoB elevated able to pull to EoB  Transfers Overall transfer level: Modified independent Equipment used: None             General transfer comment: increased time and effort required  Ambulation/Gait Ambulation/Gait assistance: Supervision Gait Distance (Feet): 50 Feet Assistive device: None Gait Pattern/deviations: Shuffle;Step-through pattern     General Gait Details: asupervision for slow, shuffling gait, 4/4 SoB by end of ambulation       Balance Overall  balance assessment: Mild deficits observed, not formally tested                                           Pertinent Vitals/Pain Pain Assessment: No/denies pain    Home Living Family/patient expects to be discharged to:: Private residence Living Arrangements: Spouse/significant other (and 3/6 children) Available Help at Discharge: Family;Available 24 hours/day Type of Home: House Home Access: Stairs to enter   [employer] of Steps: 5 Home Layout: One level Home Equipment: None      Prior Function Level of Independence: Independent                  Extremity/Trunk Assessment   Upper Extremity Assessment Upper Extremity Assessment: Overall WFL for tasks assessed    Lower Extremity Assessment Lower Extremity Assessment: Overall WFL for tasks assessed       Communication   Communication: No difficulties  Cognition Arousal/Alertness: Awake/alert Behavior During Therapy: WFL for tasks assessed/performed Overall Cognitive Status: Within Functional Limits for tasks assessed                                        General Comments General comments (skin integrity, edema, etc.): HR at rest 87 bpm, with ambulation 106 bpm (max noted), SaO2 on RA >90%O2 throughout session, requires cuing for purse lip breathing to recover from SOB  Assessment/Plan    PT Assessment Patient needs continued PT services  PT Problem List Decreased activity tolerance;Decreased mobility;Cardiopulmonary status limiting activity       PT Treatment Interventions Gait training;Functional mobility training;Therapeutic activities;Patient/family education    PT Goals (Current goals can be found in the Care Plan section)  Acute Rehab PT Goals Patient Stated Goal: home when I can PT Goal Formulation: With patient Time For Goal Achievement: 11/29/19 Potential to Achieve Goals: Good    Frequency Min 3X/week    AM-PAC PT "6 Clicks" Mobility   Outcome Measure Help needed turning from your back to your side while in a flat bed without using bedrails?: None Help needed moving from lying on your back to sitting on the side of a flat bed without using bedrails?: None Help needed moving to and from a bed to a chair (including a wheelchair)?: None Help needed standing up from a chair using your arms (e.g., wheelchair or bedside chair)?: None Help needed to walk in hospital room?: None Help needed climbing 3-5 steps with a railing? : None 6 Click Score: 24    End of Session   Activity Tolerance: Patient tolerated treatment well Patient left: with call bell/phone within reach;in bed (up in the room) Nurse Communication: Mobility status PT Visit Diagnosis: Difficulty in walking, not elsewhere classified (R26.2)    Time: 2671-2458 PT Time Calculation (min) (ACUTE ONLY): 32 min   Charges:   PT Evaluation $PT Eval Moderate Complexity: 1 Mod PT Treatments $Gait Training: 8-22 mins        Chima Astorino B. Beverely Risen PT, DPT Acute Rehabilitation Services Pager [phone] Office [phone]   Judith Bentley 11/25/2019, 11:44 AM

## 2019-11-25 NOTE — TOC Initial Note (Addendum)
Transition of Care [facility]) - Initial/Assessment Note    Patient Details  Name: Judith Bentley MRN: [id number] Date of Birth: 1975-06-12  [facility] CM/SW Contact:    Nance Pear, RN Phone Number: 11/25/2019, 12:41 PM  Clinical Narrative:                 Case manager noted patient without PCP >Called patient on phone due to COVID >Patient states that goes to [facility] for care but often seeks care at urgent care >Discussed with patient that needs to establish care with PCP for continuity of care and not urgent care >Called [facility] for appointment and patient has not been seen since 2012 as a patient >Discussed that patient lives with spouse for support, no DME needs noted. >Discussed need to follow up with PCP within 7 days of discharge >Discussed appointment made with COVID clinic 12/07/19 at 10 am   Expected Discharge Plan: Home/Self Care Barriers to Discharge: No Barriers Identified   Patient Goals and CMS Choice Patient states their goals for this hospitalization and ongoing recovery are:: to go home      Expected Discharge Plan and Services Expected Discharge Plan: Home/Self Care       Living arrangements for the past 2 months: Single Family Home Expected Discharge Date: 11/25/19                                    Prior Living Arrangements/Services Living arrangements for the past 2 months: Single Family Home Lives with:: Spouse Patient language and need for interpreter reviewed:: Yes Do you feel safe going back to the place where you live?: Yes      Need for Family Participation in Patient Care: Yes (Comment) Care giver support system in place?: Yes (comment)   Criminal Activity/Legal Involvement Pertinent to Current Situation/Hospitalization: No - Comment as needed  Activities of Daily Living      Permission Sought/Granted Permission sought to share information with : Case Manager Permission granted to share  information with : Yes, Verbal Permission Granted              Emotional Assessment   Attitude/Demeanor/Rapport: Engaged Affect (typically observed): Appropriate Orientation: : Oriented to Self, Oriented to Place, Oriented to  Time, Oriented to Situation Alcohol / Substance Use: Not Applicable Psych Involvement: No (comment)  Admission diagnosis:  Syncope [R55] Syncope, unspecified syncope type [R55] Nausea and vomiting, intractability of vomiting not specified, unspecified vomiting type [R11.2] COVID-19 virus infection [U07.1] Patient Active Problem List   Diagnosis Date Noted  . Class 2 obesity due to excess calories with body mass index (BMI) of 38.0 to 38.9 in adult 11/24/2019  . Cyclical vomiting syndrome   . Syncope 11/21/2019  . Anemia 11/21/2019  . Pneumonia due to COVID-19 virus 11/21/2019   PCP:  Patient, No Pcp Per Pharmacy:   [id number] - [place], [place] - [address] [place]  [address] Phone: [phone] Fax: [phone]     Social Determinants of Health (SDOH) Interventions    Readmission Risk Interventions No flowsheet data found.

## 2019-12-07 ENCOUNTER — Ambulatory Visit
Admission: RE | Admit: 2019-12-07 | Discharge: 2019-12-07 | Disposition: A | Discharge status: Home / Self Care | Payer: 59 | Source: Ambulatory Visit | Attending: Nurse Practitioner | Admitting: Nurse Practitioner

## 2019-12-07 ENCOUNTER — Ambulatory Visit (INDEPENDENT_AMBULATORY_CARE_PROVIDER_SITE_OTHER): Payer: 59 | Admitting: Nurse Practitioner

## 2019-12-07 ENCOUNTER — Other Ambulatory Visit: Payer: Self-pay

## 2019-12-07 VITALS — BP 132/94 | HR 101 | Temp 97.5°F | Wt 251.0 lb

## 2019-12-07 DIAGNOSIS — U071 COVID-19: Secondary | ICD-10-CM

## 2019-12-07 DIAGNOSIS — J1282 Pneumonia due to coronavirus disease 2019: Secondary | ICD-10-CM

## 2019-12-07 MED ORDER — PREDNISONE 20 MG PO TABS: 20.0000 mg | ORAL_TABLET | Freq: Every day | ORAL | 0 refills | Status: AC

## 2019-12-07 NOTE — Assessment & Plan Note (Signed)
Cough:   Stay well hydrated  Stay active  Deep breathing exercises  May start vitamin C 2,000 mg daily, vitamin D3 2,000 IU daily, Zinc 220 mg daily, and Quercetin 500 mg twice daily  May take tylenol or fever or pain  May take mucinex DM twice daily  Will order chest x ray  Will order labs   Follow up:  Follow up in 1 month or sooner if needed

## 2019-12-07 NOTE — Patient Instructions (Addendum)
Covid 19 Cough:   Stay well hydrated  Stay active  Deep breathing exercises  May start vitamin C 2,000 mg daily, vitamin D3 2,000 IU daily, Zinc 220 mg daily, and Quercetin 500 mg twice daily  May take tylenol or fever or pain  May take mucinex DM twice daily  Will order chest x ray  Will order labs   Follow up:  Follow up in 1 month or sooner if needed

## 2019-12-07 NOTE — Progress Notes (Signed)
@Patient  ID: , female    DOB: 1975/03/05, 44 y.o.   MRN: [id number]  Chief Complaint  Patient presents with  . Hospitalization Follow-up    Hosp: 10/1-10/4 COVID Pos: 10/1 recieved infusion. Sx: Cough, SOB. Not vaccinated    Referring provider: No ref. provider found   44 year old female with history of obesity.  HPI  Patient presents today for post COVID care clinic visit/hospital follow-up.  Patient was hospitalized on 11/20/2019 through 11/23/2019 and then returned to the hospital and was hospitalized from 11/24/2019 through 11/25/2019.  Patient was treated with remdesivir and steroids for Covid pneumonia.  Patient states that she has been stable since hospital discharge.  She still has significant cough.  She did not take Tessalon Perles as directed at ED discharge.  Patient will need repeat labs and chest x-ray today. Denies f/c/s, n/v/d, hemoptysis, PND, chest pain or edema.      Allergies  Allergen Reactions  . Codeine Nausea And Vomiting     There is no immunization history on file for this patient.  Past Medical History:  Diagnosis Date  . COVID-19   . Cyclical vomiting syndrome     Tobacco History: Social History   Tobacco Use  Smoking Status Never Smoker  Smokeless Tobacco Never Used   Counseling given: Not Answered   Outpatient Encounter Medications as of 12/07/2019  Medication Sig  . albuterol (VENTOLIN HFA) 108 (90 Base) MCG/ACT inhaler Inhale 2 puffs into the lungs every 6 (six) hours as needed for wheezing or shortness of breath.  12/09/2019 ascorbic acid (VITAMIN C) 500 MG tablet Take 500 mg by mouth daily.  . Esomeprazole Magnesium (NEXIUM 24HR) 20 MG TBEC Take 40 mg by mouth 2 (two) times daily.  . promethazine (PHENERGAN) 25 MG tablet Take 1 tablet (25 mg total) by mouth every 8 (eight) hours as needed for nausea or vomiting.  . benzonatate (TESSALON PERLES) 100 MG capsule Take 1 capsule (100 mg total) by mouth 3 (three) times daily as  needed for cough. (Patient not taking: Reported on 12/07/2019)  . predniSONE (DELTASONE) 20 MG tablet Take 1 tablet (20 mg total) by mouth daily with breakfast for 5 days.  . [DISCONTINUED] methylPREDNISolone (MEDROL DOSEPAK) 4 MG TBPK tablet follow package directions   No facility-administered encounter medications on file as of 12/07/2019.     Review of Systems  Review of Systems  Constitutional: Positive for fatigue. Negative for fever.  HENT: Negative.   Respiratory: Positive for cough and shortness of breath.   Cardiovascular: Negative.  Negative for chest pain, palpitations and leg swelling.  Gastrointestinal: Negative.   Allergic/Immunologic: Negative.   Neurological: Negative.   Psychiatric/Behavioral: Negative.        Physical Exam  BP (!) 132/94 (BP Location: Left Arm)   Pulse (!) 101   Temp (!) 97.5 F (36.4 C)   Wt 251 lb (113.9 kg)   SpO2 96%   BMI 38.16 kg/m   Wt Readings from Last 5 Encounters:  12/07/19 251 lb (113.9 kg)  11/21/19 250 lb (113.4 kg)     Physical Exam Vitals and nursing note reviewed.  Constitutional:      General: She is not in acute distress.    Appearance: She is well-developed.  Cardiovascular:     Rate and Rhythm: Normal rate and regular rhythm.  Pulmonary:     Effort: Pulmonary effort is normal.     Breath sounds: Normal breath sounds.  Musculoskeletal:     Right lower leg:  No edema.     Left lower leg: No edema.  Neurological:     Mental Status: She is alert and oriented to person, place, and time.  Psychiatric:        Mood and Affect: Mood normal.        Behavior: Behavior normal.       Imaging: DG Abd 1 View  Result Date: 11/22/2019 CLINICAL DATA:  Nausea, vomiting. EXAM: ABDOMEN - 1 VIEW COMPARISON:  September 07, 2019. FINDINGS: The bowel gas pattern is normal. No radio-opaque calculi or other significant radiographic abnormality are seen. IMPRESSION: Negative. Electronically Signed   By: Lupita RaiderJames  Green Jr M.D.   On:  11/22/2019 14:28   CT HEAD WO CONTRAST  Result Date: 11/21/2019 CLINICAL DATA:  Mental status change.  No reported injury. EXAM: CT HEAD WITHOUT CONTRAST TECHNIQUE: Contiguous axial images were obtained from the base of the skull through the vertex without intravenous contrast. COMPARISON:  10/05/2010 head CT. FINDINGS: Brain: No evidence of parenchymal hemorrhage or extra-axial fluid collection. No mass lesion, mass effect, or midline shift. No CT evidence of acute infarction. Cerebral volume is age appropriate. No ventriculomegaly. Vascular: No acute abnormality. Skull: No evidence of calvarial fracture. Sinuses/Orbits: No fluid levels. Mild mucoperiosteal thickening throughout the visualized paranasal sinuses bilaterally. Other:  The mastoid air cells are unopacified. IMPRESSION: 1. No evidence of acute intracranial abnormality. 2. Mild chronic appearing paranasal sinusitis. Electronically Signed   By: Delbert PhenixJason A Poff M.D.   On: 11/21/2019 05:09   DG Chest Port 1 View  Result Date: 11/20/2019 CLINICAL DATA:  Cough COVID EXAM: PORTABLE CHEST 1 VIEW COMPARISON:  None. FINDINGS: Patchy basilar airspace disease. No pleural effusion. Normal heart size. No pneumothorax. IMPRESSION: Patchy basilar airspace disease suspicious for pneumonia. Electronically Signed   By: Jasmine PangKim  Fujinaga M.D.   On: 11/20/2019 22:40   CT RENAL STONE STUDY  Result Date: 11/21/2019 CLINICAL DATA:  Left lower abdominal pain, nausea and vomiting. COVID positive. Syncopal episode. EXAM: CT ABDOMEN AND PELVIS WITHOUT CONTRAST TECHNIQUE: Multidetector CT imaging of the abdomen and pelvis was performed following the standard protocol without IV contrast. COMPARISON:  09/07/2019 CT chest, abdomen and pelvis. FINDINGS: Lower chest: Patchy ground-glass opacity and peripheral consolidation at both lung bases, new. Hepatobiliary: Normal liver size. No liver mass. Cholecystectomy. No biliary ductal dilatation. Pancreas: Normal, with no mass or duct  dilation. Spleen: Normal size. No mass. Adrenals/Urinary Tract: Normal adrenals. No renal stones. No hydronephrosis. No contour deforming renal masses. Normal bladder. Stomach/Bowel: Normal non-distended stomach. Normal caliber small bowel with no small bowel wall thickening. Normal appendix. Normal large bowel with no diverticulosis, large bowel wall thickening or pericolonic fat stranding. Vascular/Lymphatic: Normal caliber abdominal aorta. No pathologically enlarged lymph nodes in the abdomen or pelvis. Reproductive: Grossly normal uterus.  No adnexal mass. Other: No pneumoperitoneum, ascites or focal fluid collection. Small fat containing umbilical hernia. Musculoskeletal: No aggressive appearing focal osseous lesions. Mild thoracolumbar spondylosis. IMPRESSION: 1. Patchy ground-glass opacity and peripheral consolidation at both lung bases, new, compatible with COVID-19 pneumonia. 2. No acute abnormality in the abdomen or pelvis. No evidence of bowel obstruction or acute bowel inflammation. Normal appendix. 3. Small fat containing umbilical hernia. Electronically Signed   By: Delbert PhenixJason A Poff M.D.   On: 11/21/2019 08:20   ECHOCARDIOGRAM LIMITED  Result Date: 11/24/2019    ECHOCARDIOGRAM LIMITED REPORT   Patient Name:   Judith CornfieldSTEPHANIE Spiegelman Date of Exam: 11/24/2019 Medical Rec #:  [id number]  Height:       68.0 in Accession #:    [id number]         Weight:       250.0 lb Date of Birth:  December 17, 1975          BSA:          2.247 m Patient Age:    44 years           BP:           124/89 mmHg Patient Gender: F                  HR:           84 bpm. Exam Location:  Inpatient Procedure: Limited Echo, Limited Color Doppler and Cardiac Doppler Indications:    CHF-Acute Diastolic 428.31 / I50.31  History:        Patient has no prior history of Echocardiogram examinations.                 COVID 19 pneumonia, nausea, vomiting, syncopal episode. anemia.  Sonographer:    Leta Jungling RDCS Referring Phys: Heide Scales  [facility]  Sonographer Comments: Image acquisition challenging due to respiratory motion. IMPRESSIONS  1. Left ventricular ejection fraction, by estimation, is 60 to 65%. The left ventricle has normal function. The left ventricle has no regional wall motion abnormalities. Left ventricular diastolic parameters are consistent with Grade II diastolic dysfunction (pseudonormalization).  2. Right ventricular systolic function is normal. The right ventricular size is normal. There is normal pulmonary artery systolic pressure.  3. The mitral valve is normal in structure. No evidence of mitral valve regurgitation. No evidence of mitral stenosis.  4. The aortic valve was not well visualized. Aortic valve regurgitation is not visualized. No aortic stenosis is present.  5. The inferior vena cava is normal in size with greater than 50% respiratory variability, suggesting right atrial pressure of 3 mmHg. FINDINGS  Left Ventricle: Left ventricular ejection fraction, by estimation, is 60 to 65%. The left ventricle has normal function. The left ventricle has no regional wall motion abnormalities. The left ventricular internal cavity size was normal in size. There is  no left ventricular hypertrophy. Left ventricular diastolic parameters are consistent with Grade II diastolic dysfunction (pseudonormalization). Right Ventricle: The right ventricular size is normal. No increase in right ventricular wall thickness. Right ventricular systolic function is normal. There is normal pulmonary artery systolic pressure. The tricuspid regurgitant velocity is 2.43 m/s, and  with an assumed right atrial pressure of 3 mmHg, the estimated right ventricular systolic pressure is 26.6 mmHg. Left Atrium: Left atrial size was normal in size. Right Atrium: Right atrial size was normal in size. Pericardium: There is no evidence of pericardial effusion. Mitral Valve: The mitral valve is normal in structure. No evidence of mitral valve stenosis. Tricuspid  Valve: The tricuspid valve is normal in structure. Tricuspid valve regurgitation is mild . No evidence of tricuspid stenosis. Aortic Valve: The aortic valve was not well visualized. Aortic valve regurgitation is not visualized. No aortic stenosis is present. Pulmonic Valve: The pulmonic valve was normal in structure. Pulmonic valve regurgitation is not visualized. No evidence of pulmonic stenosis. Aorta: The aortic root is normal in size and structure. Venous: The inferior vena cava is normal in size with greater than 50% respiratory variability, suggesting right atrial pressure of 3 mmHg. IAS/Shunts: No atrial level shunt detected by color flow Doppler. LEFT VENTRICLE PLAX 2D LVIDd:  4.50 cm  Diastology LVIDs:         3.00 cm  LV e' medial:    9.90 cm/s LV PW:         1.00 cm  LV E/e' medial:  6.9 LV IVS:        1.00 cm  LV e' lateral:   6.96 cm/s LVOT diam:     2.10 cm  LV E/e' lateral: 9.9 LV SV:         60 LV SV Index:   27 LVOT Area:     3.46 cm  LEFT ATRIUM         Index LA diam:    2.80 cm 1.25 cm/m  AORTIC VALVE LVOT Vmax:   110.00 cm/s LVOT Vmean:  69.400 cm/s LVOT VTI:    0.174 m  AORTA Ao Root diam: 3.10 cm Ao Asc diam:  3.40 cm MITRAL VALVE               TRICUSPID VALVE MV Area (PHT): 4.41 cm    TR Peak grad:   23.6 mmHg MV Decel Time: 172 msec    TR Vmax:        243.00 cm/s MV E velocity: 68.60 cm/s MV A velocity: 53.60 cm/s  SHUNTS MV E/A ratio:  1.28        Systemic VTI:  0.17 m                            Systemic Diam: 2.10 cm Chilton Si MD Electronically signed by Chilton Si MD Signature Date/Time: 11/24/2019/11:33:34 AM    Final      Assessment & Plan:   Pneumonia due to COVID-19 virus Cough:   Stay well hydrated  Stay active  Deep breathing exercises  May start vitamin C 2,000 mg daily, vitamin D3 2,000 IU daily, Zinc 220 mg daily, and Quercetin 500 mg twice daily  May take tylenol or fever or pain  May take mucinex DM twice daily  Will order chest x  ray  Will order labs   Follow up:  Follow up in 1 month or sooner if needed      Judith Andrew, NP 12/07/2019

## 2019-12-08 LAB — COMPREHENSIVE METABOLIC PANEL
ALT: 25 IU/L (ref 0–32)
AST: 13 IU/L (ref 0–40)
Albumin/Globulin Ratio: 1.4 (ref 1.2–2.2)
Albumin: 3.8 g/dL (ref 3.8–4.8)
Alkaline Phosphatase: 85 IU/L (ref 44–121)
BUN/Creatinine Ratio: 15 (ref 9–23)
BUN: 11 mg/dL (ref 6–24)
Bilirubin Total: 0.2 mg/dL (ref 0.0–1.2)
CO2: 25 mmol/L (ref 20–29)
Calcium: 9.1 mg/dL (ref 8.7–10.2)
Chloride: 101 mmol/L (ref 96–106)
Creatinine, Ser: 0.73 mg/dL (ref 0.57–1.00)
GFR calc Af Amer: 114 mL/min/{1.73_m2} (ref >59)
GFR calc non Af Amer: 99 mL/min/{1.73_m2} (ref >59)
Globulin, Total: 2.8 g/dL (ref 1.5–4.5)
Glucose: 81 mg/dL (ref 65–99)
Potassium: 4.7 mmol/L (ref 3.5–5.2)
Sodium: 141 mmol/L (ref 134–144)
Total Protein: 6.6 g/dL (ref 6.0–8.5)

## 2019-12-08 LAB — CBC
Hematocrit: 36.4 % (ref 34.0–46.6)
Hemoglobin: 11.9 g/dL (ref 11.1–15.9)
MCH: 25.7 pg — ABNORMAL LOW (ref 26.6–33.0)
MCHC: 32.7 g/dL (ref 31.5–35.7)
MCV: 79 fL (ref 79–97)
Platelets: 351 10*3/uL (ref 150–450)
RBC: 4.63 x10E6/uL (ref 3.77–5.28)
RDW: 16.3 % — ABNORMAL HIGH (ref 11.7–15.4)
WBC: 7 10*3/uL (ref 3.4–10.8)

## 2020-01-06 ENCOUNTER — Ambulatory Visit (INDEPENDENT_AMBULATORY_CARE_PROVIDER_SITE_OTHER): Payer: 59 | Admitting: Nurse Practitioner

## 2020-01-06 ENCOUNTER — Ambulatory Visit
Admission: RE | Admit: 2020-01-06 | Discharge: 2020-01-06 | Disposition: A | Discharge status: Home / Self Care | Payer: 59 | Source: Ambulatory Visit | Attending: Nurse Practitioner | Admitting: Nurse Practitioner

## 2020-01-06 ENCOUNTER — Other Ambulatory Visit: Payer: Self-pay

## 2020-01-06 VITALS — BP 128/84 | HR 80 | Temp 97.1°F | Ht 69.0 in | Wt 255.0 lb

## 2020-01-06 DIAGNOSIS — U071 COVID-19: Secondary | ICD-10-CM | POA: Diagnosis not present

## 2020-01-06 DIAGNOSIS — J1282 Pneumonia due to coronavirus disease 2019: Secondary | ICD-10-CM

## 2020-01-06 DIAGNOSIS — R0609 Other forms of dyspnea: Secondary | ICD-10-CM | POA: Insufficient documentation

## 2020-01-06 DIAGNOSIS — G933 Postviral fatigue syndrome: Secondary | ICD-10-CM

## 2020-01-06 DIAGNOSIS — R059 Cough, unspecified: Secondary | ICD-10-CM | POA: Insufficient documentation

## 2020-01-06 DIAGNOSIS — R0600 Dyspnea, unspecified: Secondary | ICD-10-CM | POA: Diagnosis not present

## 2020-01-06 DIAGNOSIS — G9331 Postviral fatigue syndrome: Secondary | ICD-10-CM | POA: Insufficient documentation

## 2020-01-06 MED ORDER — PREDNISONE 20 MG PO TABS: 20.0000 mg | ORAL_TABLET | Freq: Every day | ORAL | 0 refills | Status: AC

## 2020-01-06 NOTE — Patient Instructions (Signed)
Pneumonia due to COVID-19 virus Cough Shortness of breath fatigue:   Stay well hydrated  Stay active  Deep breathing exercises  May start vitamin C daily, vitamin D3 daily, Zinc daily  May take tylenol or fever or pain  May take mucinex DM twice daily  May start zyrtec daily  Will order chest x ray  Hand out given on home PT  Will order prednisone   Follow up:  Follow up in 1 month or sooner if needed

## 2020-01-06 NOTE — Assessment & Plan Note (Signed)
Cough Shortness of breath fatigue:   Stay well hydrated  Stay active  Deep breathing exercises  May start vitamin C daily, vitamin D3 daily, Zinc daily  May take tylenol or fever or pain  May take mucinex DM twice daily  May start zyrtec daily  Will order chest x ray  Hand out given on home PT  Will order prednisone   Follow up:  Follow up in 1 month or sooner if needed

## 2020-01-06 NOTE — Progress Notes (Signed)
@Patient  ID: , female    DOB: Dec 22, 1975, 44 y.o.   MRN: [id number]  Chief Complaint  Patient presents with  . Follow-up    Slight cough, SOB and fatigue.    Referring provider: No ref. provider found   44 year old female with history of obesity.  Diagnosed with Covid October 2021.     HPI  Patient presents today for post COVID care clinic visit follow-up.  Patient was hospitalized on 11/20/2019 through 11/23/2019 and return to the hospital and was hospitalized from 11/24/2019 through 11/25/2019.  Patient was last seen in our office on 12/07/2019.  Her chest x-ray showed improved aeration with residual scarring versus atelectasis at the bases.  Patient states that she is slowly improving.  She admits that she does need to work more deep breathing exercises.  She states that she does still get winded walking up stairs.  She has not been using her inhaler often.  She states that her cough is much improved but still has cough in the morning and a slight cough throughout the day. Denies f/c/s, n/v/d, hemoptysis, PND, chest pain or edema.       Allergies  Allergen Reactions  . Codeine Nausea And Vomiting     There is no immunization history on file for this patient.  Past Medical History:  Diagnosis Date  . COVID-19   . Cyclical vomiting syndrome     Tobacco History: Social History   Tobacco Use  Smoking Status Never Smoker  Smokeless Tobacco Never Used   Counseling given: Yes   Outpatient Encounter Medications as of 01/06/2020  Medication Sig  . albuterol (VENTOLIN HFA) 108 (90 Base) MCG/ACT inhaler Inhale 2 puffs into the lungs every 6 (six) hours as needed for wheezing or shortness of breath. (Patient taking differently: Inhale 2 puffs into the lungs every 6 (six) hours as needed for wheezing or shortness of breath. As needed)  . ascorbic acid (VITAMIN C) 500 MG tablet Take 500 mg by mouth daily.  . Esomeprazole Magnesium (NEXIUM 24HR) 20 MG TBEC Take  40 mg by mouth 2 (two) times daily.  . promethazine (PHENERGAN) 25 MG tablet Take 1 tablet (25 mg total) by mouth every 8 (eight) hours as needed for nausea or vomiting.  . benzonatate (TESSALON PERLES) 100 MG capsule Take 1 capsule (100 mg total) by mouth 3 (three) times daily as needed for cough. (Patient not taking: Reported on 12/07/2019)  . predniSONE (DELTASONE) 20 MG tablet Take 1 tablet (20 mg total) by mouth daily with breakfast for 5 days.   No facility-administered encounter medications on file as of 01/06/2020.     Review of Systems  Review of Systems  Constitutional: Positive for fatigue. Negative for fever.  HENT: Negative.   Respiratory: Positive for cough and shortness of breath.   Cardiovascular: Negative.  Negative for chest pain, palpitations and leg swelling.  Gastrointestinal: Negative.   Allergic/Immunologic: Negative.   Neurological: Negative.   Psychiatric/Behavioral: Negative.        Physical Exam  BP 128/84 (BP Location: Left Arm)   Pulse 80   Temp (!) 97.1 F (36.2 C)   Ht 5\' 9"  (1.753 m)   Wt 255 lb (115.7 kg)   SpO2 97%   BMI 37.66 kg/m   Wt Readings from Last 5 Encounters:  01/06/20 255 lb (115.7 kg)  12/07/19 251 lb (113.9 kg)  11/21/19 250 lb (113.4 kg)     Physical Exam Vitals and nursing note reviewed.  Constitutional:  General: She is not in acute distress.    Appearance: She is well-developed.  Cardiovascular:     Rate and Rhythm: Normal rate and regular rhythm.  Pulmonary:     Effort: Pulmonary effort is normal.     Breath sounds: Normal breath sounds.  Musculoskeletal:     Right lower leg: No edema.     Left lower leg: No edema.  Neurological:     Mental Status: She is alert and oriented to person, place, and time.  Psychiatric:        Mood and Affect: Mood normal.        Behavior: Behavior normal.        Assessment & Plan:   Pneumonia due to COVID-19 virus Cough Shortness of breath fatigue:   Stay  well hydrated  Stay active  Deep breathing exercises  May start vitamin C daily, vitamin D3 daily, Zinc daily  May take tylenol or fever or pain  May take mucinex DM twice daily  May start zyrtec daily  Will order chest x ray  Hand out given on home PT  Will order prednisone   Follow up:  Follow up in 1 month or sooner if needed        Ivonne Andrew, NP 01/06/2020

## 2020-02-08 ENCOUNTER — Ambulatory Visit: Payer: 59

## 2020-06-20 ENCOUNTER — Observation Stay (HOSPITAL_COMMUNITY)
Admission: EM | Admit: 2020-06-20 | Discharge: 2020-06-21 | Disposition: A | Discharge status: Home / Self Care | Payer: 59 | Attending: Family Medicine | Admitting: Family Medicine

## 2020-06-20 ENCOUNTER — Observation Stay (HOSPITAL_BASED_OUTPATIENT_CLINIC_OR_DEPARTMENT_OTHER): Payer: 59

## 2020-06-20 ENCOUNTER — Encounter (HOSPITAL_COMMUNITY): Payer: Self-pay | Admitting: Student

## 2020-06-20 ENCOUNTER — Other Ambulatory Visit: Payer: Self-pay

## 2020-06-20 ENCOUNTER — Emergency Department (HOSPITAL_COMMUNITY): Payer: 59

## 2020-06-20 DIAGNOSIS — Z20822 Contact with and (suspected) exposure to covid-19: Secondary | ICD-10-CM | POA: Insufficient documentation

## 2020-06-20 DIAGNOSIS — Z8616 Personal history of COVID-19: Secondary | ICD-10-CM | POA: Diagnosis not present

## 2020-06-20 DIAGNOSIS — H6690 Otitis media, unspecified, unspecified ear: Secondary | ICD-10-CM | POA: Insufficient documentation

## 2020-06-20 DIAGNOSIS — R0789 Other chest pain: Principal | ICD-10-CM | POA: Insufficient documentation

## 2020-06-20 DIAGNOSIS — R739 Hyperglycemia, unspecified: Secondary | ICD-10-CM | POA: Diagnosis not present

## 2020-06-20 DIAGNOSIS — R079 Chest pain, unspecified: Secondary | ICD-10-CM | POA: Diagnosis not present

## 2020-06-20 DIAGNOSIS — R55 Syncope and collapse: Secondary | ICD-10-CM | POA: Diagnosis not present

## 2020-06-20 DIAGNOSIS — R072 Precordial pain: Secondary | ICD-10-CM

## 2020-06-20 LAB — GLUCOSE, CAPILLARY: Glucose-Capillary: 120 mg/dL — ABNORMAL HIGH (ref 70–99)

## 2020-06-20 LAB — CBC WITH DIFFERENTIAL/PLATELET
Abs Immature Granulocytes: 0.14 10*3/uL — ABNORMAL HIGH (ref 0.00–0.07)
Basophils Absolute: 0 10*3/uL (ref 0.0–0.1)
Basophils Relative: 0 %
Eosinophils Absolute: 0 10*3/uL (ref 0.0–0.5)
Eosinophils Relative: 0 %
HCT: 34.8 % — ABNORMAL LOW (ref 36.0–46.0)
Hemoglobin: 11.1 g/dL — ABNORMAL LOW (ref 12.0–15.0)
Immature Granulocytes: 1 %
Lymphocytes Relative: 11 %
Lymphs Abs: 1.3 10*3/uL (ref 0.7–4.0)
MCH: 26 pg (ref 26.0–34.0)
MCHC: 31.9 g/dL (ref 30.0–36.0)
MCV: 81.5 fL (ref 80.0–100.0)
Monocytes Absolute: 0.5 10*3/uL (ref 0.1–1.0)
Monocytes Relative: 4 %
Neutro Abs: 9.9 10*3/uL — ABNORMAL HIGH (ref 1.7–7.7)
Neutrophils Relative %: 84 %
Platelets: 307 10*3/uL (ref 150–400)
RBC: 4.27 MIL/uL (ref 3.87–5.11)
RDW: 15 % (ref 11.5–15.5)
WBC: 11.9 10*3/uL — ABNORMAL HIGH (ref 4.0–10.5)
nRBC: 0 % (ref 0.0–0.2)

## 2020-06-20 LAB — COMPREHENSIVE METABOLIC PANEL
ALT: 37 U/L (ref 0–44)
AST: 20 U/L (ref 15–41)
Albumin: 3.4 g/dL — ABNORMAL LOW (ref 3.5–5.0)
Alkaline Phosphatase: 82 U/L (ref 38–126)
Anion gap: 8 (ref 5–15)
BUN: 10 mg/dL (ref 6–20)
CO2: 25 mmol/L (ref 22–32)
Calcium: 9.1 mg/dL (ref 8.9–10.3)
Chloride: 105 mmol/L (ref 98–111)
Creatinine, Ser: 0.66 mg/dL (ref 0.44–1.00)
GFR, Estimated: >60 mL/min (ref >60)
Glucose, Bld: 196 mg/dL — ABNORMAL HIGH (ref 70–99)
Potassium: 3.8 mmol/L (ref 3.5–5.1)
Sodium: 138 mmol/L (ref 135–145)
Total Bilirubin: 0.4 mg/dL (ref 0.3–1.2)
Total Protein: 6.7 g/dL (ref 6.5–8.1)

## 2020-06-20 LAB — ECHOCARDIOGRAM COMPLETE
AR max vel: 4.43 cm2
AV Area VTI: 4.93 cm2
AV Area mean vel: 4.57 cm2
AV Mean grad: 5 mmHg
AV Peak grad: 9.1 mmHg
Ao pk vel: 1.51 m/s
Area-P 1/2: 3.31 cm2
Calc EF: 70.5 %
Height: 68 in
S' Lateral: 3.4 cm
Single Plane A2C EF: 76.7 %
Single Plane A4C EF: 61.7 %
Weight: 4201.09 oz

## 2020-06-20 LAB — LIPASE, BLOOD: Lipase: 27 U/L (ref 11–51)

## 2020-06-20 LAB — HEMOGLOBIN A1C
Hgb A1c MFr Bld: 5.6 % (ref 4.8–5.6)
Mean Plasma Glucose: 114.02 mg/dL

## 2020-06-20 LAB — TROPONIN I (HIGH SENSITIVITY)
Troponin I (High Sensitivity): 3 ng/L (ref <18)
Troponin I (High Sensitivity): 4 ng/L (ref <18)

## 2020-06-20 LAB — URINALYSIS, ROUTINE W REFLEX MICROSCOPIC
Bilirubin Urine: NEGATIVE
Glucose, UA: NEGATIVE mg/dL
Hgb urine dipstick: NEGATIVE
Ketones, ur: NEGATIVE mg/dL
Leukocytes,Ua: NEGATIVE
Nitrite: NEGATIVE
Protein, ur: NEGATIVE mg/dL
Specific Gravity, Urine: 1.019 (ref 1.005–1.030)
pH: 6 (ref 5.0–8.0)

## 2020-06-20 LAB — I-STAT BETA HCG BLOOD, ED (MC, WL, AP ONLY): I-stat hCG, quantitative: <5 m[IU]/mL (ref <5)

## 2020-06-20 LAB — RESP PANEL BY RT-PCR (FLU A&B, COVID) ARPGX2
Influenza A by PCR: NEGATIVE
Influenza B by PCR: NEGATIVE
SARS Coronavirus 2 by RT PCR: NEGATIVE

## 2020-06-20 LAB — D-DIMER, QUANTITATIVE: D-Dimer, Quant: 0.28 ug/mL-FEU (ref 0.00–0.50)

## 2020-06-20 MED ORDER — DM-GUAIFENESIN ER 30-600 MG PO TB12
1.0000 | ORAL_TABLET | Freq: Two times a day (BID) | ORAL | Status: DC | PRN
  Filled 2020-06-20: qty 1

## 2020-06-20 MED ORDER — AMOXICILLIN 500 MG PO CAPS
500.0000 mg | ORAL_CAPSULE | Freq: Two times a day (BID) | ORAL | Status: DC
  Administered 2020-06-20 – 2020-06-21 (×3): 500 mg via ORAL
  Filled 2020-06-20 (×4): qty 1

## 2020-06-20 MED ORDER — ONDANSETRON HCL 4 MG/2ML IJ SOLN
4.0000 mg | Freq: Four times a day (QID) | INTRAMUSCULAR | Status: DC | PRN
  Administered 2020-06-20: 4 mg via INTRAVENOUS
  Filled 2020-06-20: qty 2

## 2020-06-20 MED ORDER — FLUTICASONE PROPIONATE 50 MCG/ACT NA SUSP
2.0000 | Freq: Every day | NASAL | Status: DC | PRN
  Administered 2020-06-20: 2 via NASAL
  Filled 2020-06-20: qty 16

## 2020-06-20 MED ORDER — LIDOCAINE VISCOUS HCL 2 % MT SOLN
15.0000 mL | Freq: Once | OROMUCOSAL | Status: AC
  Administered 2020-06-20: 15 mL via ORAL
  Filled 2020-06-20: qty 15

## 2020-06-20 MED ORDER — SODIUM CHLORIDE 0.9% FLUSH
3.0000 mL | Freq: Two times a day (BID) | INTRAVENOUS | Status: DC
  Administered 2020-06-20 – 2020-06-21 (×3): 3 mL via INTRAVENOUS

## 2020-06-20 MED ORDER — ONDANSETRON HCL 4 MG PO TABS: 4.0000 mg | ORAL_TABLET | Freq: Four times a day (QID) | ORAL | Status: DC | PRN

## 2020-06-20 MED ORDER — ENOXAPARIN SODIUM 40 MG/0.4ML IJ SOSY
40.0000 mg | PREFILLED_SYRINGE | INTRAMUSCULAR | Status: DC
  Administered 2020-06-20 – 2020-06-21 (×2): 40 mg via SUBCUTANEOUS
  Filled 2020-06-20 (×2): qty 0.4

## 2020-06-20 MED ORDER — PROMETHAZINE HCL 25 MG PO TABS
25.0000 mg | ORAL_TABLET | Freq: Three times a day (TID) | ORAL | Status: DC | PRN
Start: 2020-06-20 — End: 2020-06-21

## 2020-06-20 MED ORDER — FAMOTIDINE IN NACL 20-0.9 MG/50ML-% IV SOLN
20.0000 mg | Freq: Once | INTRAVENOUS | Status: AC
  Administered 2020-06-20: 20 mg via INTRAVENOUS
  Filled 2020-06-20: qty 50

## 2020-06-20 MED ORDER — PREDNISONE 20 MG PO TABS
20.0000 mg | ORAL_TABLET | Freq: Two times a day (BID) | ORAL | Status: DC
  Administered 2020-06-20 – 2020-06-21 (×3): 20 mg via ORAL
  Filled 2020-06-20 (×3): qty 1

## 2020-06-20 MED ORDER — ACETAMINOPHEN 650 MG RE SUPP
650.0000 mg | Freq: Four times a day (QID) | RECTAL | Status: DC | PRN
Start: 2020-06-20 — End: 2020-06-21

## 2020-06-20 MED ORDER — ALBUTEROL SULFATE HFA 108 (90 BASE) MCG/ACT IN AERS
2.0000 | INHALATION_SPRAY | Freq: Four times a day (QID) | RESPIRATORY_TRACT | Status: DC | PRN
Start: 2020-06-20 — End: 2020-06-21

## 2020-06-20 MED ORDER — ALUM & MAG HYDROXIDE-SIMETH 200-200-20 MG/5ML PO SUSP
30.0000 mL | Freq: Once | ORAL | Status: AC
  Administered 2020-06-20: 30 mL via ORAL
  Filled 2020-06-20: qty 30

## 2020-06-20 MED ORDER — ACETAMINOPHEN 325 MG PO TABS: 650.0000 mg | ORAL_TABLET | Freq: Four times a day (QID) | ORAL | Status: DC | PRN

## 2020-06-20 MED ORDER — SODIUM CHLORIDE 0.9 % IV BOLUS
1000.0000 mL | Freq: Once | INTRAVENOUS | Status: AC
  Administered 2020-06-20: 1000 mL via INTRAVENOUS

## 2020-06-20 MED ORDER — BENZONATATE 100 MG PO CAPS: 100.0000 mg | ORAL_CAPSULE | Freq: Three times a day (TID) | ORAL | Status: DC | PRN

## 2020-06-20 MED ORDER — PANTOPRAZOLE SODIUM 40 MG PO TBEC
80.0000 mg | DELAYED_RELEASE_TABLET | Freq: Two times a day (BID) | ORAL | Status: DC
  Administered 2020-06-20 – 2020-06-21 (×3): 80 mg via ORAL
  Filled 2020-06-20 (×4): qty 2

## 2020-06-20 MED ORDER — FENTANYL CITRATE (PF) 100 MCG/2ML IJ SOLN
50.0000 ug | Freq: Once | INTRAMUSCULAR | Status: AC
  Administered 2020-06-20: 50 ug via INTRAVENOUS
  Filled 2020-06-20: qty 2

## 2020-06-20 NOTE — Progress Notes (Signed)
*  PRELIMINARY RESULTS* Echocardiogram 2D Echocardiogram has been performed.  Judith Bentley 06/20/2020, 11:03 AM

## 2020-06-20 NOTE — ED Triage Notes (Signed)
Pt arrives by EMS, ems reports pt husband called EMS as cardiac arrest no chest compressions, However, multiple syncope's. EMS found pt Ax04 sitting on the bed complaining of weakness and dizziness, & worsening chest pain 3 nitro were given by EMS  with mild relief. Pt took 324 of aspirin at home  Ems reports was seen today and treated with antibiotics for an ear infection     HR 102  98/60 95 RA  cbg 223     18 R ac

## 2020-06-20 NOTE — ED Notes (Signed)
Attempted report to inpatient unit x 1 

## 2020-06-20 NOTE — ED Provider Notes (Signed)
[facility] EMERGENCY DEPARTMENT Provider Note   CSN: [id number] Arrival date & time: 06/20/20  0051     History Chief Complaint  Patient presents with  . Chest Pain  . SYNCOPE    Judith Bentley is a 45 y.o. female with a hx of cyclic vomiting syndrome who presents to the ED for evaluation of chest pain and syncope tonight. Patient has felt sick for the past 1 week with congestion, cough, & ear pain, seen @ UC earlier today and diagnosed with bilateral AOM & given steroids & amoxicillin. Tonight she was sitting in a bean bag chair when she developed central chest discomfort described as heaviness and something sitting on her chest, she stood up to go get her amoxicillin to take her night time does and felt nauseated & lightheaded with diaphoresis and worsening chest pain with ambulation. She called for her family members, they relay that she did pass out and they lowered her to the floor.  Per her husband she woke back up I would say couple of things and then subsequently passed back out again, this happened a total of 4 times  which very much concerned him.  Patient states that she has had chest pain before but never like this or it scared her.  Currently patient states she feels tired & continues to have some chest pressure centrally, non-radiating. She took phenergan prior to EMS arrival, EMS administered aspirin & NTG x3 with relief somewhat. She denies fever, abdominal pain, vomiting, melena, or hematochezia. She had some diarrhea yesterday. Denies leg pain/swelling, hemoptysis, recent surgery/trauma, recent long travel, hormone use, personal hx of cancer, or hx of DVT/PE.   HPI     Past Medical History:  Diagnosis Date  . COVID-19   . Cyclical vomiting syndrome     Patient Active Problem List   Diagnosis Date Noted  . Dyspnea on exertion 01/06/2020  . Cough 01/06/2020  . 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 Class 2 obesity due to excess calories with  body mass index (BMI) of 38.0 to 38.9 in adult 11/24/2019  . Cyclical vomiting syndrome   . Syncope 11/21/2019  . Anemia 11/21/2019  . Pneumonia due to COVID-19 virus 11/21/2019    Past Surgical History:  Procedure Laterality Date  . CESAREAN SECTION    . CHOLECYSTECTOMY       OB History   No obstetric history on file.     Family History  Problem Relation Age of Onset  . Diabetes Mellitus II Maternal Grandfather     Social History   Tobacco Use  . Smoking status: Never Smoker  . Smokeless tobacco: Never Used  Substance Use Topics  . Alcohol use: Not Currently  . Drug use: Not Currently    Home Medications Prior to Admission medications   Medication Sig Start Date End Date Taking? Authorizing Provider  albuterol (VENTOLIN HFA) 108 (90 Base) MCG/ACT inhaler Inhale 2 puffs into the lungs every 6 (six) hours as needed for wheezing or shortness of breath. Patient taking differently: Inhale 2 puffs into the lungs every 6 (six) hours as needed for wheezing or shortness of breath. As needed 11/23/19   Leroy Sea, MD  ascorbic acid (VITAMIN C) 500 MG tablet Take 500 mg by mouth daily.    [provider]  benzonatate (TESSALON PERLES) 100 MG capsule Take 1 capsule (100 mg total) by mouth 3 (three) times daily as needed for cough. Patient not taking: Reported on 12/07/2019 11/25/19 11/24/20  Elgergawy, Leana Roe, MD  Esomeprazole Magnesium (NEXIUM 24HR) 20 MG TBEC Take 40 mg by mouth 2 (two) times daily. 11/23/19   Leroy Sea, MD  promethazine (PHENERGAN) 25 MG tablet Take 1 tablet (25 mg total) by mouth every 8 (eight) hours as needed for nausea or vomiting. 11/23/19   Leroy Sea, MD    Allergies    Codeine  Review of Systems   Review of Systems  Constitutional: Negative for chills and fever.  HENT: Positive for congestion and ear pain.   Respiratory: Positive for cough. Negative for shortness of breath.   Cardiovascular: Negative for chest pain.   Gastrointestinal: Positive for diarrhea and nausea. Negative for abdominal pain, blood in stool, constipation and vomiting.  Genitourinary: Negative for dysuria.  Neurological: Positive for syncope.  All other systems reviewed and are negative.   Physical Exam Updated Vital Signs BP 114/74   Pulse 97   Temp 97.8 F (36.6 C) (Oral)   Resp 16   SpO2 97%   Physical Exam Vitals and nursing note reviewed.  Constitutional:      General: She is not in acute distress.    Appearance: She is well-developed. She is not toxic-appearing.  HENT:     Head: Normocephalic and atraumatic.     Right Ear: Tympanic membrane is erythematous. Tympanic membrane is not perforated or retracted.     Left Ear: Tympanic membrane is erythematous. Tympanic membrane is not perforated or retracted.     Ears:     Comments: TMs are mildly full & erythematous. No mastoid erythema/swelling/tenderness.     Nose:     Right Sinus: No maxillary sinus tenderness or frontal sinus tenderness.     Left Sinus: No maxillary sinus tenderness or frontal sinus tenderness.     Mouth/Throat:     Pharynx: Uvula midline. No oropharyngeal exudate or posterior oropharyngeal erythema.     Comments: Posterior oropharynx is symmetric appearing. Patient tolerating own secretions without difficulty. No trismus. No drooling. No hot potato voice. No swelling beneath the tongue, submandibular compartment is soft.  Eyes:     General: Vision grossly intact. Gaze aligned appropriately.        Right eye: No discharge.        Left eye: No discharge.     Extraocular Movements: Extraocular movements intact.     Conjunctiva/sclera: Conjunctivae normal.     Pupils: Pupils are equal, round, and reactive to light.     Comments: PERRL. No proptosis.   Cardiovascular:     Rate and Rhythm: Normal rate and regular rhythm.     Heart sounds: No murmur heard.   Pulmonary:     Effort: Pulmonary effort is normal. No respiratory distress.     Breath  sounds: Normal breath sounds. No wheezing, rhonchi or rales.  Abdominal:     General: There is no distension.     Palpations: Abdomen is soft.     Tenderness: There is no abdominal tenderness.  Musculoskeletal:     Cervical back: Normal range of motion and neck supple. No edema or rigidity.  Lymphadenopathy:     Cervical: No cervical adenopathy.  Skin:    General: Skin is warm and dry.     Findings: No rash.  Neurological:     Mental Status: She is alert.     Comments: Alert. Clear speech. No facial droop. CNIII-XII grossly intact. Bilateral upper and lower extremities' sensation grossly intact. 5/5 symmetric strength with grip strength and with plantar  and dorsi flexion bilaterally .intact coordination.   Psychiatric:        Behavior: Behavior normal.     ED Results / Procedures / Treatments   Labs (all labs ordered are listed, but only abnormal results are displayed) Labs Reviewed  COMPREHENSIVE METABOLIC PANEL - Abnormal; Notable for the following components:      Result Value   Glucose, Bld 196 (*)    Albumin 3.4 (*)    All other components within normal limits  CBC WITH DIFFERENTIAL/PLATELET - Abnormal; Notable for the following components:   WBC 11.9 (*)    Hemoglobin 11.1 (*)    HCT 34.8 (*)    Neutro Abs 9.9 (*)    Abs Immature Granulocytes 0.14 (*)    All other components within normal limits  RESP PANEL BY RT-PCR (FLU A&B, COVID) ARPGX2  D-DIMER, QUANTITATIVE  LIPASE, BLOOD  URINALYSIS, ROUTINE W REFLEX MICROSCOPIC  I-STAT BETA HCG BLOOD, ED ([facility], [facility], [facility] ONLY)  TROPONIN I (HIGH SENSITIVITY)  TROPONIN I (HIGH SENSITIVITY)    EKG EKG Interpretation  Date/Time:  Monday Jun 20 2020 00:58:02 EDT Ventricular Rate:  96 PR Interval:  145 QRS Duration: 89 QT Interval:  348 QTC Calculation: 440 R Axis:   65 Text Interpretation: Sinus rhythm Normal ECG No significant change since 11/24/2019 Confirmed by Geoffery Lyons ([id number]) on 06/20/2020 1:05:59  AM   Radiology DG Chest Portable 1 View  Result Date: 06/20/2020 CLINICAL DATA:  Chest pain EXAM: PORTABLE CHEST 1 VIEW COMPARISON:  Radiograph 01/06/2020 FINDINGS: Streaky opacities and vascular crowding in the lung bases favoring atelectatic change. No focal consolidative process is seen. No pneumothorax or visible effusion. Cardiomediastinal contours are unremarkable for portable technique. No acute osseous or soft tissue abnormality. Telemetry leads overlie the chest. IMPRESSION: Streaky basilar opacities and vascular crowding favoring atelectasis. No other acute cardiopulmonary abnormality. Electronically Signed   By: Kreg Shropshire M.D.   On: 06/20/2020 02:34    Procedures Procedures   Medications Ordered in ED Medications - No data to display  ED Course  I have reviewed the triage vital signs and the nursing notes.  Pertinent labs & imaging results that were available during my care of the patient were reviewed by me and considered in my medical decision making (see chart for details).    MDM Rules/Calculators/A&P                          Patient presents to the ED with complaints of chest pain and syncope tonight.  Patient is nontoxic, her vitals are without significant abnormality.  Heart and lung exam reassuring.  She has no focal neuro deficits on exam.  EKG: No significant ischemic changes. Additional history obtained:  Additional history obtained from chart review & nursing note review.   Lab Tests:  I Ordered, reviewed, and interpreted labs, which included:  CBC: Mild leukocytosis, mild anemia similar to prior. CMP: Hyperglycemia without acidosis or anion gap elevation.  Mild hypoalbuminemia. Lipase: Within normal limits Initial troponin: Within normal limits D-dimer: Within normal limits Pregnancy test: Negative COVID/influenza testing: Negative  Imaging Studies ordered:  I ordered imaging studies which included chest x-ray, I independently reviewed, formal  radiology impression shows:  Streaky basilar opacities and vascular crowding favoring atelectasis. No other acute cardiopulmonary abnormality.  ED Course:  On reevaluation patient is feeling somewhat improved but still not well.  D-dimer within normal limits, low risk Wells, doubt PE.  Her heart pathway score  is a 4, with this in combination with her multiple syncopal events will discuss with hospital service for admission, patient and her husband are in agreement.  Findings and plan of care discussed with supervising physician Dr. Judd Lienelo who is in agreement.   04:40: CONSULT: Discussed with hospitalist Dr. Julian ReilGardner- accepts admission.   Portions of this note were generated with Scientist, clinical (histocompatibility and immunogenetics)Dragon dictation software. Dictation errors may occur despite best attempts at proofreading.  Final Clinical Impression(s) / ED Diagnoses Final diagnoses:  Chest pain, unspecified type  Syncope, unspecified syncope type    Rx / DC Orders ED Discharge Orders    None       Cherly Andersonetrucelli, Gredmarie Delange R, PA-C 06/20/20 [id number]    Geoffery Lyonselo, Douglas, MD 06/20/20 [phone]

## 2020-06-20 NOTE — Consult Note (Signed)
CARDIOLOGY CONSULT NOTE  Patient ID: Judith Bentley MRN: [id number] DOB/AGE: 1976-01-12 45 y.o.  Admit date: 06/20/2020 Referring Physician  Meredeth Ide, MD Primary Physician:  Patient, No Pcp Per (Inactive) Reason for Consultation  Chest pain   Patient ID: Tom Ragsdale, female    DOB: 20-Jan-1976, 45 y.o.   MRN: [id number]  Chief Complaint  Patient presents with  . Chest Pain  . SYNCOPE   HPI:    Stepfanie Yott  is a 56 y.o. obese caucasian female with no significant cardiac history.  Patient does admit that she has not been followed on a regular basis for preventative health care.  Denies family history of premature coronary artery disease.  Patient presented to [facility] emergency department yesterday with complaints of chest pain.  Patient describes chest pain as "heaviness and pressure" located substernally.  She reports she first noted chest pain last night around 10 PM, she then went back to sleep and was awakened by this pain around 11:30 PM.  Patient reports chest pain is worse with exertion and associated with shortness of breath.  States that last night she got out of bed to go to the bathroom and pain got significantly worse, she therefore called for her husband who brought her back to bed.  Her husband then reported the patient appeared to have syncopal episode while sitting in the bed, patient does not recall this.  EMS was activated and during transport to the hospital patient was given sublingual nitroglycerin, which improved her chest pain.  Work-up in the emergency department revealed chest x-ray with findings suggestive of atelectasis, serial troponin negative, EKG unremarkable.  Notably patient has been having congestion, cough, ear pain for the last 1 week, and was recently started on steroids and amoxicillin for treatment of acute otitis media.  Patient reports significant improvement of chest pain symptoms at time of the exam.  Although she does state she  continues to have some "cramping feeling" on the left side of her chest and left shoulder.  Upon further questioning she also reports occasional episodes of palpitations for the last 1 week as well as apneic episodes according to her husband at night while she was sleeping.  Past Medical History:  Diagnosis Date  . COVID-19   . Cyclical vomiting syndrome    Past Surgical History:  Procedure Laterality Date  . CESAREAN SECTION    . CHOLECYSTECTOMY     Family History  Problem Relation Age of Onset  . Diabetes Mellitus II Maternal Grandfather    Social History   Tobacco Use  . Smoking status: Never Smoker  . Smokeless tobacco: Never Used  Substance Use Topics  . Alcohol use: Not Currently    Marital Sttus: Married  ROS  Review of Systems  Constitutional: Negative for malaise/fatigue and weight gain.  Cardiovascular: Positive for chest pain, dyspnea on exertion, palpitations and syncope. Negative for claudication, leg swelling, near-syncope, orthopnea and paroxysmal nocturnal dyspnea.  Hematologic/Lymphatic: Does not bruise/bleed easily.  Gastrointestinal: Negative for melena.  Neurological: Positive for light-headedness. Negative for dizziness and weakness.  All other systems reviewed and are negative.  Objective   Vitals with BMI 06/20/2020 06/20/2020 06/20/2020  Height -  -  Weight - 262 lbs 9 oz -  BMI - 39.93 -  Systolic 119 117 086  Diastolic 74 73 76  Pulse 81 75 75    Blood pressure 119/74, pulse 81, temperature 97.9 F (36.6 C), temperature source Oral, resp. rate 19, height  (  1.727 m), weight 119.1 kg, SpO2 98 %.    Physical Exam Vitals reviewed.  Constitutional:      General: She is not in acute distress.    Appearance: She is obese.  HENT:     Head: Normocephalic and atraumatic.     Right Ear: External ear normal.     Left Ear: External ear normal.     Nose: Nose normal.     Mouth/Throat:     Mouth: Mucous membranes are moist.  Eyes:      Extraocular Movements: Extraocular movements intact.     Conjunctiva/sclera: Conjunctivae normal.  Cardiovascular:     Rate and Rhythm: Normal rate and regular rhythm.     Pulses: Intact distal pulses.          Carotid pulses are 2+ on the right side and 2+ on the left side.      Radial pulses are 2+ on the right side and 2+ on the left side.       Dorsalis pedis pulses are 2+ on the right side and 2+ on the left side.       Posterior tibial pulses are 2+ on the right side and 2+ on the left side.     Heart sounds: S1 normal and S2 normal. No murmur heard. No gallop.      Comments: Femoral and popliteal pulses difficult to evaluate due to patient body habitus. Pulmonary:     Effort: Pulmonary effort is normal. No respiratory distress.     Breath sounds: No wheezing, rhonchi or rales.  Abdominal:     General: Bowel sounds are normal. There is no distension.     Palpations: Abdomen is soft.  Musculoskeletal:     Right lower leg: No edema.     Left lower leg: No edema.  Skin:    General: Skin is warm and dry.  Neurological:     General: No focal deficit present.     Mental Status: She is alert and oriented to person, place, and time.  Psychiatric:        Mood and Affect: Mood normal.        Behavior: Behavior normal.    Laboratory examination:   Recent Labs    11/23/19 0413 11/24/19 0444 11/25/19 0701 12/07/19 1025 06/20/20 0119  NA 140 141 143 141 138  K 3.6 3.1* 3.9 4.7 3.8  CL 102 101 103 101 105  CO2 32 29 28 25 25   GLUCOSE 111* 122* 161* 81 196*  BUN 8 8 7 11 10   CREATININE 0.67 0.70 0.58 0.73 0.66  CALCIUM 8.5* 8.4* 8.6* 9.1 9.1  GFRNONAA >60 >60 >60 99 >60  GFRAA >60 >60  --  114  --    estimated creatinine clearance is 118 mL/min (by C-G formula based on SCr of 0.66 mg/dL).  CMP Latest Ref Rng & Units 06/20/2020 12/07/2019 11/25/2019  Glucose 70 - 99 mg/dL 12/09/2019) 81 01/25/2020)  BUN 6 - 20 mg/dL 10 11 7   Creatinine 0.44 - 1.00 mg/dL 803(O 122(Q  Sodium 135 -  145 mmol/L 138 141 143  Potassium 3.5 - 5.1 mmol/L 3.8 4.7 3.9  Chloride 98 - 111 mmol/L 105 101 103  CO2 22 - 32 mmol/L 25 25 28   Calcium 8.9 - 10.3 mg/dL 9.1 9.1 8.25)  Total Protein 6.5 - 8.1 g/dL 6.7 6.6 0.03)  Total Bilirubin 0.3 - 1.2 mg/dL 0.4 0.2 0.5  Alkaline Phos 38 - 126 U/L 82 85 76  AST 15 - 41 U/L 20 13 30   ALT 0 - 44 U/L 37 25 70(H)   CBC Latest Ref Rng & Units 06/20/2020 12/07/2019 11/25/2019  WBC 4.0 - 10.5 K/uL 11.9(H) 7.0 2.1(L)  Hemoglobin 12.0 - 15.0 g/dL 11.1(L) 11.9 10.5(L)  Hematocrit 36.0 - 46.0 % 34.8(L) 36.4 33.8(L)  Platelets 150 - 400 K/uL 307 351 204   Lipid Panel No results for input(s): CHOL, TRIG, LDLCALC, VLDL, HDL, CHOLHDL, LDLDIRECT in the last 8760 hours.  HEMOGLOBIN A1C Lab Results  Component Value Date   HGBA1C 5.6 06/20/2020   MPG 114.02 06/20/2020   TSH No results for input(s): TSH in the last 8760 hours. BNP (last 3 results) Recent Labs    11/21/19 0856  BNP 39.1   Results for orders placed or performed during the hospital encounter of 06/20/20 (from the past 48 hour(s))  Comprehensive metabolic panel     Status: Abnormal   Collection Time: 06/20/20  1:19 AM  Result Value Ref Range   Sodium 138 135 - 145 mmol/L   Potassium 3.8 3.5 - 5.1 mmol/L   Chloride 105 98 - 111 mmol/L   CO2 25 22 - 32 mmol/L   Glucose, Bld 196 (H) 70 - 99 mg/dL    Comment: Glucose reference range applies only to samples taken after fasting for at least 8 hours.   BUN 10 6 - 20 mg/dL   Creatinine, Ser 1.610.66 0.44 - 1.00 mg/dL   Calcium 9.1 8.9 - 09.610.3 mg/dL   Total Protein 6.7 6.5 - 8.1 g/dL   Albumin 3.4 (L) 3.5 - 5.0 g/dL   AST 20 15 - 41 U/L   ALT 37 0 - 44 U/L   Alkaline Phosphatase 82 38 - 126 U/L   Total Bilirubin 0.4 0.3 - 1.2 mg/dL   GFR, Estimated >04>60 >54>60 mL/min    Comment: (NOTE) Calculated using the CKD-EPI Creatinine Equation (2021)    Anion gap 8 5 - 15    Comment: Performed at [facility], [address] [place], [place]  [id number]  CBC with Differential     Status: Abnormal   Collection Time: 06/20/20  1:19 AM  Result Value Ref Range   WBC 11.9 (H) 4.0 - 10.5 K/uL   RBC 4.27 3.87 - 5.11 MIL/uL   Hemoglobin 11.1 (L) 12.0 - 15.0 g/dL   HCT 91.434.8 (L) 78.236.0 - 95.646.0 %   MCV 81.5 80.0 - 100.0 fL   MCH 26.0 26.0 - 34.0 pg   MCHC 31.9 30.0 - 36.0 g/dL   RDW 21.315.0 08.611.5 - 57.815.5 %   Platelets 307 150 - 400 K/uL   nRBC 0.0 0.0 - 0.2 %   Neutrophils Relative % 84 %   Neutro Abs 9.9 (H) 1.7 - 7.7 K/uL   Lymphocytes Relative 11 %   Lymphs Abs 1.3 0.7 - 4.0 K/uL   Monocytes Relative 4 %   Monocytes Absolute 0.5 0.1 - 1.0 K/uL   Eosinophils Relative 0 %   Eosinophils Absolute 0.0 0.0 - 0.5 K/uL   Basophils Relative 0 %   Basophils Absolute 0.0 0.0 - 0.1 K/uL   Immature Granulocytes 1 %   Abs Immature Granulocytes 0.14 (H) 0.00 - 0.07 K/uL    Comment: Performed at [facility], [address] [place], [place] [id number]  Troponin I (High Sensitivity)     Status: None   Collection Time: 06/20/20  1:19 AM  Result Value Ref Range   Troponin  I (High Sensitivity) 3 <18 ng/L    Comment: (NOTE) Elevated high sensitivity troponin I (hsTnI) values and significant  changes across serial measurements may suggest ACS but many other  chronic and acute conditions are known to elevate hsTnI results.  Refer to the "Links" section for chest pain algorithms and additional  guidance. Performed at [facility], [address], [place], [place] [address]   D-dimer, quantitative     Status: None   Collection Time: 06/20/20  1:19 AM  Result Value Ref Range   D-Dimer, Quant 0.28 0.00 - 0.50 ug/mL-FEU    Comment: (NOTE) At the manufacturer cut-off value of 0.5 g/mL FEU, this assay has a negative predictive value of 95-100%.This assay is intended for use in conjunction with a clinical pretest probability (PTP) assessment model to exclude pulmonary embolism (PE) and deep venous thrombosis (DVT) in outpatients suspected of PE  or DVT. Results should be correlated with clinical presentation. Performed at [facility], [address] [place], [place] [address]   Lipase, blood     Status: None   Collection Time: 06/20/20  1:19 AM  Result Value Ref Range   Lipase 27 11 - 51 U/L    Comment: Performed at [facility], [address] [place], [place] [address]  Urinalysis, Routine w reflex microscopic Urine, Random     Status: None   Collection Time: 06/20/20  1:20 AM  Result Value Ref Range   Color, Urine YELLOW YELLOW   APPearance CLEAR CLEAR   Specific Gravity, Urine 1.019 1.005 - 1.030   pH 6.0 5.0 - 8.0   Glucose, UA NEGATIVE NEGATIVE mg/dL   Hgb urine dipstick NEGATIVE NEGATIVE   Bilirubin Urine NEGATIVE NEGATIVE   Ketones, ur NEGATIVE NEGATIVE mg/dL   Protein, ur NEGATIVE NEGATIVE mg/dL   Nitrite NEGATIVE NEGATIVE   Leukocytes,Ua NEGATIVE NEGATIVE    Comment: Performed at [facility], [address], [place], [place] [address]  I-Stat beta hCG blood, ED     Status: None   Collection Time: 06/20/20  2:06 AM  Result Value Ref Range   I-stat hCG, quantitative <5.0 <5 mIU/mL   Comment 3            Comment:   GEST. AGE      CONC.  (mIU/mL)   <=1 WEEK        5 - 50     2 WEEKS       50 - 500     3 WEEKS       100 - 10,000     4 WEEKS     1,000 - 30,000        FEMALE AND NON-PREGNANT FEMALE:     LESS THAN 5 mIU/mL   Resp Panel by RT-PCR (Flu A&B, Covid) Nasopharyngeal Swab     Status: None   Collection Time: 06/20/20  2:17 AM   Specimen: Nasopharyngeal Swab; Nasopharyngeal(NP) swabs in vial transport medium  Result Value Ref Range   SARS Coronavirus 2 by RT PCR NEGATIVE NEGATIVE    Comment: (NOTE) SARS-CoV-2 target nucleic acids are NOT DETECTED.  The SARS-CoV-2 RNA is generally detectable in upper respiratory specimens during the acute phase of infection. The lowest concentration of SARS-CoV-2 viral copies this assay can detect is 138 copies/mL. A negative result does not  preclude SARS-Cov-2 infection and should not be used as the sole basis for treatment or other patient management decisions. A negative result may  occur with  improper specimen collection/handling, submission of specimen other than nasopharyngeal swab, presence of viral mutation(s) within the areas targeted by this assay, and inadequate number of viral copies(<138 copies/mL). A negative result must be combined with clinical observations, patient history, and epidemiological information. The expected result is Negative.  Fact Sheet for Patients:  BloggerCourse.com  Fact Sheet for Healthcare Providers:  SeriousBroker.it  This test is no t yet approved or cleared by the [place] FDA and  has been authorized for detection and/or diagnosis of SARS-CoV-2 by FDA under an Emergency Use Authorization (EUA). This EUA will remain  in effect (meaning this test can be used) for the duration of the COVID-19 declaration under Section 564(b)(1) of the Act, 21 U.S.C.section 360bbb-3(b)(1), unless the authorization is terminated  or revoked sooner.       Influenza A by PCR NEGATIVE NEGATIVE   Influenza B by PCR NEGATIVE NEGATIVE    Comment: (NOTE) The Xpert Xpress SARS-CoV-2/FLU/RSV plus assay is intended as an aid in the diagnosis of influenza from Nasopharyngeal swab specimens and should not be used as a sole basis for treatment. Nasal washings and aspirates are unacceptable for Xpert Xpress SARS-CoV-2/FLU/RSV testing.  Fact Sheet for Patients: BloggerCourse.com  Fact Sheet for Healthcare Providers: SeriousBroker.it  This test is not yet approved or cleared by the [place] FDA and has been authorized for detection and/or diagnosis of SARS-CoV-2 by FDA under an Emergency Use Authorization (EUA). This EUA will remain in effect (meaning this test can be used) for the duration of  the COVID-19 declaration under Section 564(b)(1) of the Act, 21 U.S.C. section 360bbb-3(b)(1), unless the authorization is terminated or revoked.  Performed at [facility], [address], [place], [place] [address]   Troponin I (High Sensitivity)     Status: None   Collection Time: 06/20/20  3:19 AM  Result Value Ref Range   Troponin I (High Sensitivity) 4 <18 ng/L    Comment: (NOTE) Elevated high sensitivity troponin I (hsTnI) values and significant  changes across serial measurements may suggest ACS but many other  chronic and acute conditions are known to elevate hsTnI results.  Refer to the "Links" section for chest pain algorithms and additional  guidance. Performed at [facility], [address] [place], [place] [address]   Hemoglobin A1c     Status: None   Collection Time: 06/20/20  5:16 AM  Result Value Ref Range   Hgb A1c MFr Bld 5.6 4.8 - 5.6 %    Comment: (NOTE) Pre diabetes:          5.7%-6.4%  Diabetes:              >6.4%  Glycemic control for   <7.0% adults with diabetes    Mean Plasma Glucose 114.02 mg/dL    Comment: Performed at [facility], [address] [place], [place] [address]  Glucose, capillary     Status: Abnormal   Collection Time: 06/20/20  7:40 AM  Result Value Ref Range   Glucose-Capillary 120 (H) 70 - 99 mg/dL    Comment: Glucose reference range applies only to samples taken after fasting for at least 8 hours.    Medications and allergies   Allergies  Allergen Reactions  . Codeine Nausea And Vomiting     Current Meds  Medication Sig  . albuterol (VENTOLIN HFA) 108 (90 Base) MCG/ACT inhaler Inhale 2 puffs into the lungs every 6 (six) hours as needed for wheezing  or shortness of breath.  Marland Kitchen amoxicillin (AMOXIL) 500 MG capsule Take 500 mg by mouth See admin instructions. Bid x 5 days  . ascorbic acid (VITAMIN C) 500 MG tablet Take 500 mg by mouth daily.  . benzonatate (TESSALON PERLES) 100 MG capsule Take 1  capsule (100 mg total) by mouth 3 (three) times daily as needed for cough.  . Esomeprazole Magnesium (NEXIUM 24HR) 20 MG TBEC Take 40 mg by mouth 2 (two) times daily.  . fluticasone (FLONASE) 50 MCG/ACT nasal spray Place 2 sprays into both nostrils daily as needed for allergies or rhinitis.  Marland Kitchen phenylephrine (SUDAFED PE) 10 MG TABS tablet Take 10 mg by mouth every 4 (four) hours as needed (congestion).  . predniSONE (DELTASONE) 20 MG tablet Take 20 mg by mouth See admin instructions. Bid x 3 days  . promethazine (PHENERGAN) 25 MG tablet Take 1 tablet (25 mg total) by mouth every 8 (eight) hours as needed for nausea or vomiting.    Scheduled Meds: . amoxicillin  500 mg Oral BID  . enoxaparin (LOVENOX) injection  40 mg Subcutaneous Q24H  . pantoprazole  80 mg Oral BID  . predniSONE  20 mg Oral BID  . sodium chloride flush  3 mL Intravenous Q12H   Continuous Infusions: PRN Meds:.acetaminophen **OR** acetaminophen, albuterol, benzonatate, fluticasone, ondansetron **OR** ondansetron (ZOFRAN) IV, promethazine   No intake/output data recorded. Total I/O In: -  Out: 250 [Urine:250]    Radiology:   DG Chest Portable 1 View: 06/20/2020 FINDINGS: Streaky opacities and vascular crowding in the lung bases favoring atelectatic change. No focal consolidative process is seen. No pneumothorax or visible effusion. Cardiomediastinal contours are unremarkable for portable technique. No acute osseous or soft tissue abnormality. Telemetry leads overlie the chest.  IMPRESSION: Streaky basilar opacities and vascular crowding favoring atelectasis. No other acute cardiopulmonary abnormality.   Cardiac Studies:   ECHOCARDIOGRAM COMPLETE 06/20/2020 1. Left ventricular ejection fraction, by estimation, is 60 to 65%. The left ventricle has normal function. The left ventricle has no regional wall motion abnormalities. Left ventricular diastolic parameters were normal.   2. Right ventricular systolic function is  normal. The right ventricular size is normal. There is normal pulmonary artery systolic pressure. The estimated right ventricular systolic pressure is 29.8 mmHg.   3. The mitral valve is normal in structure. No evidence of mitral valve regurgitation.   4. The aortic valve is tricuspid. Aortic valve regurgitation is not visualized. No aortic stenosis is present.   5. The inferior vena cava is normal in size with greater than 50% respiratory variability, suggesting right atrial pressure of 3 mmHg.   Telemetry: Predominantly normal sinus rhythm.  Episodes of nonsustained ventricular tachycardia, PVCs.  EKG: 06/20/2020: Sinus rhythm at a rate of 96 bpm.  Normal axis. No evidence of ischemia or underlying injury pattern.  Assessment  Jemimah Cressy  is a 45 y.o. obese caucasian female with no significant cardiac history.  Patient does admit that she has not been followed on a regular basis for preventative health care.  Denies family history of premature coronary artery disease.  Who presented to [facility] for evaluation of chest pain, syncope.  Chest pain Syncope PVCs/nonsustained ventricular tachycardia Palpitations Apneic episodes   Recommendations:   Chest pain Patient's serial troponins were negative, EKG unremarkable, and echocardiogram without regional wall motion abnormalities, which is reassuring.  However patient does have cardiovascular risk factors and her chest pain was responsive to nitroglycerin.  In view of patient's symptoms, risk  factors, as well as frequent PVCs and episodes of nonsustained ventricular tachycardia, recommend patient undergo inpatient nuclear stress test to evaluate for underlying coronary artery disease. Will also obtain lipid profile testing for further risk evaluation.   Syncope Etiology of patient's syncopal episode yesterday remains unclear.  She has had no recurrence of syncope or near syncope since presentation to the hospital.   Possible etiologies of syncope include both orthostatic hypotension as her blood pressure is presently soft and underlying cardiac arrhythmia, particularly as patient does not report prodromal symptoms.  Further evaluation with nuclear stress test.   PVCs/nonsustained ventricular tachycardia As multiform PVCs and nonsustained ventricular tachycardia may be related to underlying ischemia we will perform nuclear stress test inpatient.  Could consider low-dose AV nodal blocker if patient's blood pressure allows, however present her blood pressure is soft, will hold off at this time.  Palpitations Management per above.  Apneic episodes  Patient will benefit from outpatient sleep evaluation to evaluate for underlying sleep apnea.  Will continue to follow patient. Thank you for involving [place] in their care.   Patient was seen in collaboration with Dr. Rosemary Holms. He also reviewed patient's chart and examined the patient. Dr. Rosemary Holms is in agreement of the plan.     Rayford Halsted, PA-C 06/20/2020, 12:13 PM Office: [phone]

## 2020-06-20 NOTE — Progress Notes (Signed)
Subjective: Patient admitted this morning, see detailed H&P by Dr. Julian Reil 46 year old female with a history of obesity, CVA in the past.  Presented to ED with complaints of chest pain, syncope.  Patient says that she felt sick for 1 week with cough, congestion, ear pain.  She was seen in urgent care yesterday and diagnosed with bilateral acute otitis media.  She was given steroids and amoxicillin.  At home she developed central chest pain with central heaviness.  She stood up and felt lightheaded.  Chest pain was worse with ambulation.  She also had syncope episodes x2.  Cardiac enzymes x2 were unremarkable.  Vitals:   06/20/20 1326 06/20/20 1602  BP: 134/81 137/83  Pulse: 63 84  Resp: 17 17  Temp: 97.9 F (36.6 C) 98.2 F (36.8 C)  SpO2: 97% 95%      A/P Atypical chest pain-patient placed into observation for atypical chest pain, though her cardiac enzymes are negative, EKG is unremarkable.  Patient is morbidly obese with typical presentation of chest pain with chest heaviness and syncope.  Will consult cardiology for possible stress test.  Syncope-unclear etiology, EKG is unremarkable.  Cardiology will be consulted as above.  Otitis media-patient was diagnosed with acute otitis media, continue prednisone and amoxicillin.  Sudafed was stopped.  Hyperglycemia-patient was found to have elevated CBG, likely from steroids.  Hemoglobin A1c is 5.6.    Meredeth Ide [facility] [phone]

## 2020-06-20 NOTE — H&P (Signed)
History and Physical    Judith Bentley UXN:[id number] DOB: 1975-11-14 DOA: 06/20/2020  PCP: Patient, No Pcp Per (Inactive)  Patient coming from: Home  I have personally briefly reviewed patient's old medical records in [facility]  Chief Complaint: CP, syncope  HPI: Judith Bentley is a 45 y.o. female with medical history significant of obesity, CVS in past.  Pt presents to ED with c/o CP and syncope tonight.  Pt felt sick for past 1 week with congestion, cough, ear pain.  Seen at UC earlier today, diagnosed with bilateral acute otitis media.  Given steroids and amoxicillin.  Tonight while sitting at home, developed central chest pain, heaviness quality.  Stood up, felt light headed.  CP worse with ambulation.  Called for husband.  Had syncope, caught by family members and lowered to floor.  Pt then woke up, would say a couple of things then pass out again.  CP persistent.  EMS arrived.  Gave NTG => improvement in CP.  Has nausea.  No vomiting.  No fever, abd pain, melena, hematochezia.  No recent travel, hormone use, prior DVT/PE.   ED Course: Trop neg, D.Dimer neg.  CP 3/10 currently.   Review of Systems: As per HPI, otherwise all review of systems negative.  Past Medical History:  Diagnosis Date  . COVID-19   . Cyclical vomiting syndrome     Past Surgical History:  Procedure Laterality Date  . CESAREAN SECTION    . CHOLECYSTECTOMY       reports that she has never smoked. She has never used smokeless tobacco. She reports previous alcohol use. She reports previous drug use.  Allergies  Allergen Reactions  . Codeine Nausea And Vomiting    Family History  Problem Relation Age of Onset  . Diabetes Mellitus II Maternal Grandfather      Prior to Admission medications   Medication Sig Start Date End Date Taking? Authorizing Provider  albuterol (VENTOLIN HFA) 108 (90 Base) MCG/ACT inhaler Inhale 2 puffs into the lungs every 6 (six) hours as needed  for wheezing or shortness of breath. 11/23/19  Yes Leroy Sea, MD  amoxicillin (AMOXIL) 500 MG capsule Take 500 mg by mouth See admin instructions. Bid x 5 days 06/19/20  Yes [provider]  ascorbic acid (VITAMIN C) 500 MG tablet Take 500 mg by mouth daily.   Yes [provider]  benzonatate (TESSALON PERLES) 100 MG capsule Take 1 capsule (100 mg total) by mouth 3 (three) times daily as needed for cough. 11/25/19 11/24/20 Yes Elgergawy, Leana Roe, MD  Esomeprazole Magnesium (NEXIUM 24HR) 20 MG TBEC Take 40 mg by mouth 2 (two) times daily. 11/23/19  Yes Leroy Sea, MD  fluticasone (FLONASE) 50 MCG/ACT nasal spray Place 2 sprays into both nostrils daily as needed for allergies or rhinitis.   Yes [provider]  phenylephrine (SUDAFED PE) 10 MG TABS tablet Take 10 mg by mouth every 4 (four) hours as needed (congestion).   Yes [provider]  predniSONE (DELTASONE) 20 MG tablet Take 20 mg by mouth See admin instructions. Bid x 3 days 06/19/20  Yes [provider]  promethazine (PHENERGAN) 25 MG tablet Take 1 tablet (25 mg total) by mouth every 8 (eight) hours as needed for nausea or vomiting. 11/23/19  Yes Leroy Sea, MD    Physical Exam: Vitals:   06/20/20 0059 06/20/20 0100 06/20/20 0300 06/20/20 0400  BP:  115/71 114/74 115/63  Pulse:  (!) 101 78 84  Resp:  18 14 18   Temp: 97.8 F (36.6 C)     TempSrc: Oral     SpO2:  97% 98% 98%    Constitutional: NAD, calm, comfortable Eyes: PERRL, lids and conjunctivae normal ENMT: Mucous membranes are moist. Posterior pharynx clear of any exudate or lesions.Normal dentition.  Neck: normal, supple, no masses, no thyromegaly Respiratory: clear to auscultation bilaterally, no wheezing, no crackles. Normal respiratory effort. No accessory muscle use.  Cardiovascular: Regular rate and rhythm, no murmurs / rubs / gallops. No extremity edema. 2+ pedal pulses. No carotid bruits.  Abdomen: no  tenderness, no masses palpated. No hepatosplenomegaly. Bowel sounds positive.  Musculoskeletal: no clubbing / cyanosis. No joint deformity upper and lower extremities. Good ROM, no contractures. Normal muscle tone.  Skin: no rashes, lesions, ulcers. No induration Neurologic: CN 2-12 grossly intact. Sensation intact, DTR normal. Strength 5/5 in all 4.  Psychiatric: Normal judgment and insight. Alert and oriented x 3. Normal mood.    Labs on Admission: I have personally reviewed following labs and imaging studies  CBC: Recent Labs  Lab 06/20/20 0119  WBC 11.9*  NEUTROABS 9.9*  HGB 11.1*  HCT 34.8*  MCV 81.5  PLT 307   Basic Metabolic Panel: Recent Labs  Lab 06/20/20 0119  NA 138  K 3.8  CL 105  CO2 25  GLUCOSE 196*  BUN 10  CREATININE 0.66  CALCIUM 9.1   GFR: CrCl cannot be calculated (Unknown ideal weight.). Liver Function Tests: Recent Labs  Lab 06/20/20 0119  AST 20  ALT 37  ALKPHOS 82  BILITOT 0.4  PROT 6.7  ALBUMIN 3.4*   Recent Labs  Lab 06/20/20 0119  LIPASE 27   No results for input(s): AMMONIA in the last 168 hours. Coagulation Profile: No results for input(s): INR, PROTIME in the last 168 hours. Cardiac Enzymes: No results for input(s): CKTOTAL, CKMB, CKMBINDEX, TROPONINI in the last 168 hours. BNP (last 3 results) No results for input(s): PROBNP in the last 8760 hours. HbA1C: No results for input(s): HGBA1C in the last 72 hours. CBG: No results for input(s): GLUCAP in the last 168 hours. Lipid Profile: No results for input(s): CHOL, HDL, LDLCALC, TRIG, CHOLHDL, LDLDIRECT in the last 72 hours. Thyroid Function Tests: No results for input(s): TSH, T4TOTAL, FREET4, T3FREE, THYROIDAB in the last 72 hours. Anemia Panel: No results for input(s): VITAMINB12, FOLATE, FERRITIN, TIBC, IRON, RETICCTPCT in the last 72 hours. Urine analysis:    Component Value Date/Time   COLORURINE YELLOW 11/21/2019 1224   APPEARANCEUR CLEAR 11/21/2019 1224    LABSPEC 1.012 11/21/2019 1224   PHURINE 5.0 11/21/2019 1224   GLUCOSEU NEGATIVE 11/21/2019 1224   HGBUR NEGATIVE 11/21/2019 1224   BILIRUBINUR NEGATIVE 11/21/2019 1224   KETONESUR NEGATIVE 11/21/2019 1224   PROTEINUR NEGATIVE 11/21/2019 1224   NITRITE NEGATIVE 11/21/2019 1224   LEUKOCYTESUR NEGATIVE 11/21/2019 1224    Radiological Exams on Admission: DG Chest Portable 1 View  Result Date: 06/20/2020 CLINICAL DATA:  Chest pain EXAM: PORTABLE CHEST 1 VIEW COMPARISON:  Radiograph 01/06/2020 FINDINGS: Streaky opacities and vascular crowding in the lung bases favoring atelectatic change. No focal consolidative process is seen. No pneumothorax or visible effusion. Cardiomediastinal contours are unremarkable for portable technique. No acute osseous or soft tissue abnormality. Telemetry leads overlie the chest. IMPRESSION: Streaky basilar opacities and vascular crowding favoring atelectasis. No other acute cardiopulmonary abnormality. Electronically Signed   By: 01/08/2020 M.D.   On: 06/20/2020 02:34    EKG: Independently reviewed.  Sinus rhythm  rate 96  Assessment/Plan Principal Problem:   Syncope Active Problems:   Chest pain    1. Syncope - 1. Syncope pathway 2. Tele monitor 3. 2d echo in AM 2. CP - 1. trops neg 2. Tele monitor 3. NPO 4. May want cards eval after 2d echo results, ? Need for stress test 3. Otitis media - 1. Cont prednisone 2. Cont amoxicillin 3. Stop sudafed. 4. BGL 196 today - 1. Check A1C  DVT prophylaxis: Lovenox Code Status: Full Family Communication: No family in room Disposition Plan: Home after syncope and CP r/o Consults called: None Admission status: Place in 65   Alaylah Heatherington M. DO [facility]  How to contact the [facility] Attending or Consulting provider 7A - 7P or covering provider during after hours 7P -7A, for this patient?  1. Check the care team in [facility] and look for a) attending/consulting [facility] provider listed and b) the [facility] team  listed 2. Log into [email or web address]  Amion Physician Scheduling and messaging for groups and whole hospitals  On call and physician scheduling software for group practices, residents, hospitalists and other medical providers for call, clinic, rotation and shift schedules. [employer] is a hospital-wide system for scheduling doctors and paging doctors on call. EasyPlot is for scientific plotting and data analysis.  [email or web address]  and use [facility]'s universal password to access. If you do not have the password, please contact the hospital operator.  3. Locate the [facility] provider you are looking for under [facility] and page to a number that you can be directly reached. 4. If you still have difficulty reaching the provider, please page the [facility] (Director on Call) for the Hospitalists listed on amion for assistance.  06/20/2020, 5:02 AM

## 2020-06-21 ENCOUNTER — Observation Stay (HOSPITAL_COMMUNITY): Payer: 59

## 2020-06-21 DIAGNOSIS — R55 Syncope and collapse: Secondary | ICD-10-CM | POA: Diagnosis not present

## 2020-06-21 DIAGNOSIS — R079 Chest pain, unspecified: Secondary | ICD-10-CM | POA: Diagnosis not present

## 2020-06-21 LAB — BASIC METABOLIC PANEL
Anion gap: 7 (ref 5–15)
BUN: 12 mg/dL (ref 6–20)
CO2: 27 mmol/L (ref 22–32)
Calcium: 8.7 mg/dL — ABNORMAL LOW (ref 8.9–10.3)
Chloride: 106 mmol/L (ref 98–111)
Creatinine, Ser: 0.69 mg/dL (ref 0.44–1.00)
GFR, Estimated: >60 mL/min (ref >60)
Glucose, Bld: 114 mg/dL — ABNORMAL HIGH (ref 70–99)
Potassium: 4.7 mmol/L (ref 3.5–5.1)
Sodium: 140 mmol/L (ref 135–145)

## 2020-06-21 LAB — LIPID PANEL
Cholesterol: 213 mg/dL — ABNORMAL HIGH (ref 0–200)
HDL: 61 mg/dL (ref >40)
LDL Cholesterol: 124 mg/dL — ABNORMAL HIGH (ref 0–99)
Total CHOL/HDL Ratio: 3.5 RATIO
Triglycerides: 141 mg/dL (ref <150)
VLDL: 28 mg/dL (ref 0–40)

## 2020-06-21 LAB — GLUCOSE, CAPILLARY: Glucose-Capillary: 100 mg/dL — ABNORMAL HIGH (ref 70–99)

## 2020-06-21 MED ORDER — METOPROLOL SUCCINATE ER 25 MG PO TB24: 25.0000 mg | ORAL_TABLET | Freq: Every day | ORAL | 2 refills | Status: DC

## 2020-06-21 MED ORDER — REGADENOSON 0.4 MG/5ML IV SOLN: 0.4000 mg | Freq: Once | INTRAVENOUS | Status: AC

## 2020-06-21 MED ORDER — TECHNETIUM TC 99M TETROFOSMIN IV KIT
32.1000 | PACK | Freq: Once | INTRAVENOUS | Status: AC | PRN
  Administered 2020-06-21: 32.1 via INTRAVENOUS

## 2020-06-21 MED ORDER — METOPROLOL SUCCINATE ER 25 MG PO TB24
25.0000 mg | ORAL_TABLET | Freq: Every day | ORAL | Status: DC
  Administered 2020-06-21: 25 mg via ORAL
  Filled 2020-06-21: qty 1

## 2020-06-21 MED ORDER — TECHNETIUM TC 99M TETROFOSMIN IV KIT
10.0000 | PACK | Freq: Once | INTRAVENOUS | Status: AC | PRN
  Administered 2020-06-21: 10 via INTRAVENOUS

## 2020-06-21 MED ORDER — REGADENOSON 0.4 MG/5ML IV SOLN
INTRAVENOUS | Status: AC
  Administered 2020-06-21: 0.4 mg via INTRAVENOUS
  Filled 2020-06-21: qty 5

## 2020-06-21 NOTE — Progress Notes (Signed)
[facility]  PROGRESS NOTE  Judith BjorkStephanie Bentley ZOX:[id number] DOB: Jun 16, 1975 DOA: 06/20/2020 PCP: Patient, No Pcp Per (Inactive)   Brief HPI:    45 year old female with a history of obesity, CVA in the past.  Presented to ED with complaints of chest pain, syncope.  Patient says that she felt sick for 1 week with cough, congestion, ear pain.  She was seen in urgent care yesterday and diagnosed with bilateral acute otitis media.  She was given steroids and amoxicillin.  At home she developed central chest pain with central heaviness.  She stood up and felt lightheaded.  Chest pain was worse with ambulation.  She also had syncope episodes x2.  Cardiac enzymes x2 were unremarkable   Subjective   Patient seen and examined, Denies further chest pain or shortness of breath.  Plan for nuclear cardiac stress test today.   Assessment/Plan:     1. Chest pain-patient presented with chest pain, no cardiac enzymes were negative and EKG was unremarkable.  Patient is morbidly obese with typical presentation of chest pain with chest heaviness and syncope.  Cardiology was consulted and nuclear cardiac stress test has been planned for this morning. 2. Syncope-unclear etiology, EKG is unremarkable.  Cardiology following. 3. Otitis media-patient was diagnosed with acute Titus media as outpatient and started on prednisone and amoxicillin.  We will continue with these.  Sudafed has been stopped. 4. Hyperglycemia-patient found to have elevated CBG.  Likely from steroids.  Hemoglobin A1c is 5.6.  Scheduled medications:   . amoxicillin  500 mg Oral BID  . enoxaparin (LOVENOX) injection  40 mg Subcutaneous Q24H  . pantoprazole  80 mg Oral BID  . predniSONE  20 mg Oral BID  . sodium chloride flush  3 mL Intravenous Q12H         Data Reviewed:   CBG:  Recent Labs  Lab 06/20/20 0740 06/21/20 0746  GLUCAP 120* 100*    SpO2: 98 %    Vitals:   06/21/20 1142 06/21/20 1144 06/21/20 1145  06/21/20 1246  BP: (!) 140/54 121/67 131/68 120/69  Pulse: (!) 120 (!) 110 (!) 111 85  Resp: 18 18 18 14   Temp:    97.7 F (36.5 C)  TempSrc:    Oral  SpO2: 96% 98% 97% 98%  Weight:      Height:         Intake/Output Summary (Last 24 hours) at 06/21/2020 1536 Last data filed at 06/21/2020 1500 Gross per 24 hour  Intake 960 ml  Output 1250 ml  Net -290 ml    05/01 1901 - 05/03 0700 In: 600 [P.O.:600] Out: 1350 [Urine:1350]  Filed Weights   06/20/20 0600 06/21/20 0510  Weight: 119.1 kg 117.5 kg    CBC:  Recent Labs  Lab 06/20/20 0119  WBC 11.9*  HGB 11.1*  HCT 34.8*  PLT 307  MCV 81.5  MCH 26.0  MCHC 31.9  RDW 15.0  LYMPHSABS 1.3  MONOABS 0.5  EOSABS 0.0  BASOSABS 0.0    Complete metabolic panel:  Recent Labs  Lab 06/20/20 0119 06/20/20 0516 06/21/20 0255  NA 138  --  140  K 3.8  --  4.7  CL 105  --  106  CO2 25  --  27  GLUCOSE 196*  --  114*  BUN 10  --  12  CREATININE 0.66  --  0.69  CALCIUM 9.1  --  8.7*  AST 20  --   --   ALT 37  --   --  ALKPHOS 82  --   --   BILITOT 0.4  --   --   ALBUMIN 3.4*  --   --   DDIMER 0.28  --   --   HGBA1C  --  5.6  --     Recent Labs  Lab 06/20/20 0119  LIPASE 27    Recent Labs  Lab 06/20/20 0119 06/20/20 0217  DDIMER 0.28  --   SARSCOV2NAA  --  NEGATIVE    ------------------------------------------------------------------------------------------------------------------ Recent Labs    06/21/20 0255  CHOL 213*  HDL 61  LDLCALC 124*  TRIG 141  CHOLHDL 3.5    Lab Results  Component Value Date   HGBA1C 5.6 06/20/2020   ------------------------------------------------------------------------------------------------------------------ No results for input(s): TSH, T4TOTAL, T3FREE, THYROIDAB in the last 72 hours.  Invalid input(s): FREET3 ------------------------------------------------------------------------------------------------------------------ No results for input(s): VITAMINB12,  FOLATE, FERRITIN, TIBC, IRON, RETICCTPCT in the last 72 hours.  Coagulation profile No results for input(s): INR, PROTIME in the last 168 hours. Recent Labs    06/20/20 0119  DDIMER 0.28    Cardiac Enzymes No results for input(s): CKTOTAL, CKMB, CKMBINDEX, TROPONINI in the last 168 hours.  ------------------------------------------------------------------------------------------------------------------    Component Value Date/Time   BNP 39.1 11/21/2019 0856     Antibiotics: Anti-infectives (From admission, onward)   Start     Dose/Rate Route Frequency Ordered Stop   06/20/20 0500  amoxicillin (AMOXIL) capsule 500 mg       Note to Pharmacy: Bid x 5 days     500 mg Oral 2 times daily 06/20/20 0441 06/24/20 2159       Radiology Reports  NM Myocar Multi W/Spect W/Wall Motion / EF  Result Date: 06/21/2020 CLINICAL DATA:  Chest pain/anginal equivalent.  Shortness of breath. EXAM: MYOCARDIAL IMAGING WITH SPECT (REST AND PHARMACOLOGIC-STRESS) GATED LEFT VENTRICULAR WALL MOTION STUDY LEFT VENTRICULAR EJECTION FRACTION TECHNIQUE: Standard myocardial SPECT imaging was performed after resting intravenous injection of 10 mCi Tc-54m tetrofosmin. Subsequently, intravenous infusion of Lexiscan was performed under the supervision of the Cardiology staff. At peak effect of the drug, 30 mCi Tc-66m tetrofosmin was injected intravenously and standard myocardial SPECT imaging was performed. Quantitative gated imaging was also performed to evaluate left ventricular wall motion, and estimate left ventricular ejection fraction. COMPARISON:  None. FINDINGS: Perfusion: No decreased activity in the left ventricle on stress imaging to suggest reversible ischemia or infarction. Wall Motion: Normal left ventricular wall motion. No left ventricular dilation. Left Ventricular Ejection Fraction: 69 % End diastolic volume 73 ml End systolic volume 22 ml IMPRESSION: 1. No reversible ischemia or infarction. 2. Normal  left ventricular wall motion. 3. Left ventricular ejection fraction 69% 4. Non invasive risk stratification*: Low *2012 Appropriate Use Criteria for Coronary Revascularization Focused Update: J Am Coll Cardiol. 2012;59(9):857-881. http://content.[email or web address].aspx?articleid=[email or web address] Electronically Signed   By: Danae Orleans M.D.   On: 06/21/2020 15:25   DG Chest Portable 1 View  Result Date: 06/20/2020 CLINICAL DATA:  Chest pain EXAM: PORTABLE CHEST 1 VIEW COMPARISON:  Radiograph 01/06/2020 FINDINGS: Streaky opacities and vascular crowding in the lung bases favoring atelectatic change. No focal consolidative process is seen. No pneumothorax or visible effusion. Cardiomediastinal contours are unremarkable for portable technique. No acute osseous or soft tissue abnormality. Telemetry leads overlie the chest. IMPRESSION: Streaky basilar opacities and vascular crowding favoring atelectasis. No other acute cardiopulmonary abnormality. Electronically Signed   By: Kreg Shropshire M.D.   On: 06/20/2020 02:34   ECHOCARDIOGRAM COMPLETE  Result Date: 06/20/2020    ECHOCARDIOGRAM REPORT  Patient Name:   Judith Bentley Date of Exam: 06/20/2020 Medical Rec #:  [id number]          Height:       68.0 in Accession #:    [id number]         Weight:       262.6 lb Date of Birth:  10-19-1975          BSA:          2.294 m Patient Age:    45 years           BP:           117/73 mmHg Patient Gender: F                  HR:           69 bpm. Exam Location:  Inpatient Procedure: 2D Echo, Cardiac Doppler and Color Doppler Indications:    Syncope  History:        Patient has prior history of Echocardiogram examinations, most                 recent 11/24/2019.  Sonographer:    Neomia Dear RDCS Referring Phys: [id number] JARED M GARDNER IMPRESSIONS  1. Left ventricular ejection fraction, by estimation, is 60 to 65%. The left ventricle has normal function. The left ventricle has no regional wall motion abnormalities. Left ventricular  diastolic parameters were normal.  2. Right ventricular systolic function is normal. The right ventricular size is normal. There is normal pulmonary artery systolic pressure. The estimated right ventricular systolic pressure is 29.8 mmHg.  3. The mitral valve is normal in structure. No evidence of mitral valve regurgitation.  4. The aortic valve is tricuspid. Aortic valve regurgitation is not visualized. No aortic stenosis is present.  5. The inferior vena cava is normal in size with greater than 50% respiratory variability, suggesting right atrial pressure of 3 mmHg. FINDINGS  Left Ventricle: Left ventricular ejection fraction, by estimation, is 60 to 65%. The left ventricle has normal function. The left ventricle has no regional wall motion abnormalities. The left ventricular internal cavity size was normal in size. There is  no left ventricular hypertrophy. Left ventricular diastolic parameters were normal. Right Ventricle: The right ventricular size is normal. No increase in right ventricular wall thickness. Right ventricular systolic function is normal. There is normal pulmonary artery systolic pressure. The tricuspid regurgitant velocity is 2.59 m/s, and  with an assumed right atrial pressure of 3 mmHg, the estimated right ventricular systolic pressure is 29.8 mmHg. Left Atrium: Left atrial size was normal in size. Right Atrium: Right atrial size was normal in size. Pericardium: There is no evidence of pericardial effusion. Mitral Valve: The mitral valve is normal in structure. No evidence of mitral valve regurgitation. Tricuspid Valve: The tricuspid valve is normal in structure. Tricuspid valve regurgitation is trivial. Aortic Valve: The aortic valve is tricuspid. Aortic valve regurgitation is not visualized. No aortic stenosis is present. Aortic valve mean gradient measures 5.0 mmHg. Aortic valve peak gradient measures 9.1 mmHg. Aortic valve area, by VTI measures 4.93 cm. Pulmonic Valve: The pulmonic valve  was normal in structure. Pulmonic valve regurgitation is trivial. Aorta: The aortic root is normal in size and structure. Venous: The inferior vena cava is normal in size with greater than 50% respiratory variability, suggesting right atrial pressure of 3 mmHg. IAS/Shunts: No atrial level shunt detected by color flow Doppler.  LEFT VENTRICLE PLAX 2D LVIDd:  5.40 cm      Diastology LVIDs:         3.40 cm      LV e' medial:    8.59 cm/s LV PW:         0.60 cm      LV E/e' medial:  10.0 LV IVS:        0.60 cm      LV e' lateral:   11.60 cm/s LVOT diam:     2.60 cm      LV E/e' lateral: 7.4 LV SV:         152 LV SV Index:   66 LVOT Area:     5.31 cm  LV Volumes (MOD) LV vol d, MOD A2C: 72.5 ml LV vol d, MOD A4C: 110.0 ml LV vol s, MOD A2C: 16.9 ml LV vol s, MOD A4C: 42.1 ml LV SV MOD A2C:     55.6 ml LV SV MOD A4C:     110.0 ml LV SV MOD BP:      64.4 ml RIGHT VENTRICLE RV S prime:     12.20 cm/s  PULMONARY VEINS TAPSE (M-mode): 2.6 cm      A Reversal Duration: 109.00 msec                             A Reversal Velocity: 30.00 cm/s                             Diastolic Velocity:  44.80 cm/s                             S/D Velocity:        1.90                             Systolic Velocity:   86.60 cm/s LEFT ATRIUM             Index       RIGHT ATRIUM           Index LA diam:        3.20 cm 1.39 cm/m  RA Area:     13.00 cm LA Vol (A2C):   62.9 ml 27.41 ml/m RA Volume:   25.70 ml  11.20 ml/m LA Vol (A4C):   46.9 ml 20.44 ml/m LA Biplane Vol: 55.5 ml 24.19 ml/m  AORTIC VALVE                    PULMONIC VALVE AV Area (Vmax):    4.43 cm     PV Vmax:       0.88 m/s AV Area (Vmean):   4.57 cm     PV Vmean:      67.800 cm/s AV Area (VTI):     4.93 cm     PV VTI:        0.219 m AV Vmax:           151.00 cm/s  PV Peak grad:  3.1 mmHg AV Vmean:          100.000 cm/s PV Mean grad:  2.0 mmHg AV VTI:            0.309 m AV Peak Grad:      9.1 mmHg AV Mean Grad:  5.0 mmHg LVOT Vmax:         126.00 cm/s LVOT Vmean:         86.100 cm/s LVOT VTI:          0.287 m LVOT/AV VTI ratio: 0.93  AORTA Ao Root diam: 2.70 cm Ao Asc diam:  3.40 cm MITRAL VALVE               TRICUSPID VALVE MV Area (PHT): 3.31 cm    TR Peak grad:   26.8 mmHg MV Decel Time: 229 msec    TR Vmax:        259.00 cm/s MV E velocity: 85.90 cm/s MV A velocity: 81.90 cm/s  SHUNTS MV E/A ratio:  1.05        Systemic VTI:  0.29 m                            Systemic Diam: 2.60 cm Marca Ancona MD Electronically signed by Marca Ancona MD Signature Date/Time: 06/20/2020/1:40:05 PM    Final       DVT prophylaxis: Lovenox  Code Status: Full code  Family Communication: No family at bedside   Consultants:  Cardiology  Procedures:      Objective    Physical Examination:    General-appears in no acute distress  Heart-S1-S2, regular, no murmur auscultated  Lungs-clear to auscultation bilaterally, no wheezing or crackles auscultated  Abdomen-soft, nontender, no organomegaly  Extremities-no edema in the lower extremities  Neuro-alert, oriented x3, no focal deficit noted   Status is: Inpatient  Dispo: The patient is from: Home              Anticipated d/c is to: Home              Anticipated d/c date is: 06/22/2020              Patient currently not stable for discharge  Barrier to discharge-undergoing evaluation for chest pain, nuclear cardiac stress test  COVID-19 Labs  Recent Labs    06/20/20 0119  DDIMER 0.28    Lab Results  Component Value Date   SARSCOV2NAA NEGATIVE 06/20/2020   SARSCOV2NAA POSITIVE (A) 11/20/2019    Microbiology  Recent Results (from the past 240 hour(s))  Resp Panel by RT-PCR (Flu A&B, Covid) Nasopharyngeal Swab     Status: None   Collection Time: 06/20/20  2:17 AM   Specimen: Nasopharyngeal Swab; Nasopharyngeal(NP) swabs in vial transport medium  Result Value Ref Range Status   SARS Coronavirus 2 by RT PCR NEGATIVE NEGATIVE Final    Comment: (NOTE) SARS-CoV-2 target nucleic acids are  NOT DETECTED.  The SARS-CoV-2 RNA is generally detectable in upper respiratory specimens during the acute phase of infection. The lowest concentration of SARS-CoV-2 viral copies this assay can detect is 138 copies/mL. A negative result does not preclude SARS-Cov-2 infection and should not be used as the sole basis for treatment or other patient management decisions. A negative result may occur with  improper specimen collection/handling, submission of specimen other than nasopharyngeal swab, presence of viral mutation(s) within the areas targeted by this assay, and inadequate number of viral copies(<138 copies/mL). A negative result must be combined with clinical observations, patient history, and epidemiological information. The expected result is Negative.  Fact Sheet for Patients:  BloggerCourse.com  Fact Sheet for Healthcare Providers:  SeriousBroker.it  This test is no t yet approved or cleared by the [place] FDA and  has  been authorized for detection and/or diagnosis of SARS-CoV-2 by FDA under an Emergency Use Authorization (EUA). This EUA will remain  in effect (meaning this test can be used) for the duration of the COVID-19 declaration under Section 564(b)(1) of the Act, 21 U.S.C.section 360bbb-3(b)(1), unless the authorization is terminated  or revoked sooner.       Influenza A by PCR NEGATIVE NEGATIVE Final   Influenza B by PCR NEGATIVE NEGATIVE Final    Comment: (NOTE) The Xpert Xpress SARS-CoV-2/FLU/RSV plus assay is intended as an aid in the diagnosis of influenza from Nasopharyngeal swab specimens and should not be used as a sole basis for treatment. Nasal washings and aspirates are unacceptable for Xpert Xpress SARS-CoV-2/FLU/RSV testing.  Fact Sheet for Patients: BloggerCourse.com  Fact Sheet for Healthcare Providers: SeriousBroker.it  This test is not  yet approved or cleared by the [place] FDA and has been authorized for detection and/or diagnosis of SARS-CoV-2 by FDA under an Emergency Use Authorization (EUA). This EUA will remain in effect (meaning this test can be used) for the duration of the COVID-19 declaration under Section 564(b)(1) of the Act, 21 U.S.C. section 360bbb-3(b)(1), unless the authorization is terminated or revoked.  Performed at [facility], [address] [place], [place] [address]        Hello  Meredeth Ide   [facility] If 7PM-7AM, please contact night-coverage at [email or web address], Office  [phone]   06/21/2020, 3:36 PM  LOS: 0 days

## 2020-06-21 NOTE — Progress Notes (Addendum)
Reviewed stress test. No ischemia or infarction. Likely noncardiac chest pain.  Suspect symptomatic PVC as the cause. Recommend metoprolol succinate 25 mg daily.  Will arrange outpatient f/u.  Cardiology signing off. Please call us back, in case of any questions.   Elder Negus, MD Pager: [phone] Office: [phone]

## 2020-06-21 NOTE — Discharge Summary (Signed)
Physician Discharge Summary  Judith Bentley VVZ:[id number] DOB: 1975-03-27 DOA: 06/20/2020  PCP: Patient, No Pcp Per (Inactive)  Admit date: 06/20/2020 Discharge date: 06/21/2020  Time spent: 50 minutes  Recommendations for Outpatient Follow-up:  1. Follow-up PCP in 1 week. 2. Follow-up cardiology as outpatient   Discharge Diagnoses:  Principal Problem:   Syncope Active Problems:   Chest pain   Discharge Condition: Stable  Diet recommendation: Heart healthy diet  Filed Weights   06/20/20 0600 06/21/20 0510  Weight: 119.1 kg 117.5 kg    History of present illness:  45 year old female with a history of obesity, CVA in the past. Presented to ED with complaints of chest pain, syncope. Patient says that she felt sick for 1 week with cough, congestion, ear pain. She was seen in urgent care yesterday and diagnosed with bilateral acute otitis media. She was given steroids and amoxicillin. At home she developed central chest pain with central heaviness. She stood up and felt lightheaded. Chest pain was worse with ambulation. She also had syncope episodes x2. Cardiac enzymes x2 were unremarkable  Hospital Course:   1. Chest pain-patient presented with chest pain, no cardiac enzymes were negative and EKG was unremarkable.  Patient is morbidly obese with typical presentation of chest pain with chest heaviness and syncope.  Cardiology was consulted and nuclear cardiac stress test was performed, since test came back as low risk.  Patient will be discharged home.  Started on Toprol-XL 25 mg daily for suspected symptomatic PVCs as the cause of pain. 2. Syncope-resolved, unclear etiology, EKG is unremarkable.  Cardiology will follow as outpatient 3. Otitis media-patient was diagnosed with acute Titus media as outpatient and started on prednisone and amoxicillin.    Will discontinue prednisone and continue with amoxicillin.  Sudafed has been stopped. 4. Hyperglycemia-patient found to have  elevated CBG.  Likely from steroids.  Hemoglobin A1c is 5.6.   Procedures:    Consultations:  Cardiology  Discharge Exam: Vitals:   06/21/20 1145 06/21/20 1246  BP: 131/68 120/69  Pulse: (!) 111 85  Resp: 18 14  Temp:  97.7 F (36.5 C)  SpO2: 97% 98%     Discharge Instructions   Discharge Instructions    Diet - low sodium heart healthy   Complete by: As directed    Increase activity slowly   Complete by: As directed      Allergies as of 06/21/2020      Reactions   Codeine Nausea And Vomiting      Medication List    STOP taking these medications   predniSONE 20 MG tablet Commonly known as: DELTASONE     TAKE these medications   albuterol 108 (90 Base) MCG/ACT inhaler Commonly known as: VENTOLIN HFA Inhale 2 puffs into the lungs every 6 (six) hours as needed for wheezing or shortness of breath.   amoxicillin 500 MG capsule Commonly known as: AMOXIL Take 500 mg by mouth See admin instructions. Bid x 5 days   ascorbic acid 500 MG tablet Commonly known as: VITAMIN C Take 500 mg by mouth daily.   benzonatate 100 MG capsule Commonly known as: Tessalon Perles Take 1 capsule (100 mg total) by mouth 3 (three) times daily as needed for cough.   fluticasone 50 MCG/ACT nasal spray Commonly known as: FLONASE Place 2 sprays into both nostrils daily as needed for allergies or rhinitis.   metoprolol succinate 25 MG 24 hr tablet Commonly known as: TOPROL-XL Take 1 tablet (25 mg total) by mouth daily.  NexIUM 24HR 20 MG Tbec Generic drug: Esomeprazole Magnesium Take 40 mg by mouth 2 (two) times daily.   phenylephrine 10 MG Tabs tablet Commonly known as: SUDAFED PE Take 10 mg by mouth every 4 (four) hours as needed (congestion).   promethazine 25 MG tablet Commonly known as: PHENERGAN Take 1 tablet (25 mg total) by mouth every 8 (eight) hours as needed for nausea or vomiting.      Allergies  Allergen Reactions  . Codeine Nausea And Vomiting       The results of significant diagnostics from this hospitalization (including imaging, microbiology, ancillary and laboratory) are listed below for reference.    Significant Diagnostic Studies: NM Myocar Multi W/Spect W/Wall Motion / EF  Result Date: 06/21/2020 CLINICAL DATA:  Chest pain/anginal equivalent.  Shortness of breath. EXAM: MYOCARDIAL IMAGING WITH SPECT (REST AND PHARMACOLOGIC-STRESS) GATED LEFT VENTRICULAR WALL MOTION STUDY LEFT VENTRICULAR EJECTION FRACTION TECHNIQUE: Standard myocardial SPECT imaging was performed after resting intravenous injection of 10 mCi Tc-5594m tetrofosmin. Subsequently, intravenous infusion of Lexiscan was performed under the supervision of the Cardiology staff. At peak effect of the drug, 30 mCi Tc-5394m tetrofosmin was injected intravenously and standard myocardial SPECT imaging was performed. Quantitative gated imaging was also performed to evaluate left ventricular wall motion, and estimate left ventricular ejection fraction. COMPARISON:  None. FINDINGS: Perfusion: No decreased activity in the left ventricle on stress imaging to suggest reversible ischemia or infarction. Wall Motion: Normal left ventricular wall motion. No left ventricular dilation. Left Ventricular Ejection Fraction: 69 % End diastolic volume 73 ml End systolic volume 22 ml IMPRESSION: 1. No reversible ischemia or infarction. 2. Normal left ventricular wall motion. 3. Left ventricular ejection fraction 69% 4. Non invasive risk stratification*: Low *2012 Appropriate Use Criteria for Coronary Revascularization Focused Update: J Am Coll Cardiol. 2012;59(9):857-881. http://content.dementiazones.comonlinejacc.org/article.aspx?articleid=1201161 Electronically Signed   By: Danae OrleansJohn A Stahl M.D.   On: 06/21/2020 15:25   DG Chest Portable 1 View  Result Date: 06/20/2020 CLINICAL DATA:  Chest pain EXAM: PORTABLE CHEST 1 VIEW COMPARISON:  Radiograph 01/06/2020 FINDINGS: Streaky opacities and vascular crowding in the lung bases  favoring atelectatic change. No focal consolidative process is seen. No pneumothorax or visible effusion. Cardiomediastinal contours are unremarkable for portable technique. No acute osseous or soft tissue abnormality. Telemetry leads overlie the chest. IMPRESSION: Streaky basilar opacities and vascular crowding favoring atelectasis. No other acute cardiopulmonary abnormality. Electronically Signed   By: Kreg ShropshirePrice  DeHay M.D.   On: 06/20/2020 02:34   ECHOCARDIOGRAM COMPLETE  Result Date: 06/20/2020    ECHOCARDIOGRAM REPORT   Patient Name:   Judith Bentley Date of Exam: 06/20/2020 Medical Rec #:  [id number]          Height:       68.0 in Accession #:    [id number]         Weight:       262.6 lb Date of Birth:  [birth year]          BSA:          2.294 m Patient Age:    45 years           BP:           117/73 mmHg Patient Gender: F                  HR:           69 bpm. Exam Location:  Inpatient Procedure: 2D Echo, Cardiac Doppler and Color Doppler Indications:    Syncope  History:        Patient has prior history of Echocardiogram examinations, most                 recent 11/24/2019.  Sonographer:    Neomia Dear RDCS Referring Phys: [id number] JARED M GARDNER IMPRESSIONS  1. Left ventricular ejection fraction, by estimation, is 60 to 65%. The left ventricle has normal function. The left ventricle has no regional wall motion abnormalities. Left ventricular diastolic parameters were normal.  2. Right ventricular systolic function is normal. The right ventricular size is normal. There is normal pulmonary artery systolic pressure. The estimated right ventricular systolic pressure is 29.8 mmHg.  3. The mitral valve is normal in structure. No evidence of mitral valve regurgitation.  4. The aortic valve is tricuspid. Aortic valve regurgitation is not visualized. No aortic stenosis is present.  5. The inferior vena cava is normal in size with greater than 50% respiratory variability, suggesting right atrial pressure of 3 mmHg.  FINDINGS  Left Ventricle: Left ventricular ejection fraction, by estimation, is 60 to 65%. The left ventricle has normal function. The left ventricle has no regional wall motion abnormalities. The left ventricular internal cavity size was normal in size. There is  no left ventricular hypertrophy. Left ventricular diastolic parameters were normal. Right Ventricle: The right ventricular size is normal. No increase in right ventricular wall thickness. Right ventricular systolic function is normal. There is normal pulmonary artery systolic pressure. The tricuspid regurgitant velocity is 2.59 m/s, and  with an assumed right atrial pressure of 3 mmHg, the estimated right ventricular systolic pressure is 29.8 mmHg. Left Atrium: Left atrial size was normal in size. Right Atrium: Right atrial size was normal in size. Pericardium: There is no evidence of pericardial effusion. Mitral Valve: The mitral valve is normal in structure. No evidence of mitral valve regurgitation. Tricuspid Valve: The tricuspid valve is normal in structure. Tricuspid valve regurgitation is trivial. Aortic Valve: The aortic valve is tricuspid. Aortic valve regurgitation is not visualized. No aortic stenosis is present. Aortic valve mean gradient measures 5.0 mmHg. Aortic valve peak gradient measures 9.1 mmHg. Aortic valve area, by VTI measures 4.93 cm. Pulmonic Valve: The pulmonic valve was normal in structure. Pulmonic valve regurgitation is trivial. Aorta: The aortic root is normal in size and structure. Venous: The inferior vena cava is normal in size with greater than 50% respiratory variability, suggesting right atrial pressure of 3 mmHg. IAS/Shunts: No atrial level shunt detected by color flow Doppler.  LEFT VENTRICLE PLAX 2D LVIDd:         5.40 cm      Diastology LVIDs:         3.40 cm      LV e' medial:    8.59 cm/s LV PW:         0.60 cm      LV E/e' medial:  10.0 LV IVS:        0.60 cm      LV e' lateral:   11.60 cm/s LVOT diam:     2.60 cm       LV E/e' lateral: 7.4 LV SV:         152 LV SV Index:   66 LVOT Area:     5.31 cm  LV Volumes (MOD) LV vol d, MOD A2C: 72.5 ml LV vol d, MOD A4C: 110.0 ml LV vol s, MOD A2C: 16.9 ml LV vol s, MOD A4C: 42.1 ml LV SV MOD A2C:  55.6 ml LV SV MOD A4C:     110.0 ml LV SV MOD BP:      64.4 ml RIGHT VENTRICLE RV S prime:     12.20 cm/s  PULMONARY VEINS TAPSE (M-mode): 2.6 cm      A Reversal Duration: 109.00 msec                             A Reversal Velocity: 30.00 cm/s                             Diastolic Velocity:  44.80 cm/s                             S/D Velocity:        1.90                             Systolic Velocity:   86.60 cm/s LEFT ATRIUM             Index       RIGHT ATRIUM           Index LA diam:        3.20 cm 1.39 cm/m  RA Area:     13.00 cm LA Vol (A2C):   62.9 ml 27.41 ml/m RA Volume:   25.70 ml  11.20 ml/m LA Vol (A4C):   46.9 ml 20.44 ml/m LA Biplane Vol: 55.5 ml 24.19 ml/m  AORTIC VALVE                    PULMONIC VALVE AV Area (Vmax):    4.43 cm     PV Vmax:       0.88 m/s AV Area (Vmean):   4.57 cm     PV Vmean:      67.800 cm/s AV Area (VTI):     4.93 cm     PV VTI:        0.219 m AV Vmax:           151.00 cm/s  PV Peak grad:  3.1 mmHg AV Vmean:          100.000 cm/s PV Mean grad:  2.0 mmHg AV VTI:            0.309 m AV Peak Grad:      9.1 mmHg AV Mean Grad:      5.0 mmHg LVOT Vmax:         126.00 cm/s LVOT Vmean:        86.100 cm/s LVOT VTI:          0.287 m LVOT/AV VTI ratio: 0.93  AORTA Ao Root diam: 2.70 cm Ao Asc diam:  3.40 cm MITRAL VALVE               TRICUSPID VALVE MV Area (PHT): 3.31 cm    TR Peak grad:   26.8 mmHg MV Decel Time: 229 msec    TR Vmax:        259.00 cm/s MV E velocity: 85.90 cm/s MV A velocity: 81.90 cm/s  SHUNTS MV E/A ratio:  1.05        Systemic VTI:  0.29 m  Systemic Diam: 2.60 cm Marca Ancona MD Electronically signed by Marca Ancona MD Signature Date/Time: 06/20/2020/1:40:05 PM    Final     Microbiology: Recent Results  (from the past 240 hour(s))  Resp Panel by RT-PCR (Flu A&B, Covid) Nasopharyngeal Swab     Status: None   Collection Time: 06/20/20  2:17 AM   Specimen: Nasopharyngeal Swab; Nasopharyngeal(NP) swabs in vial transport medium  Result Value Ref Range Status   SARS Coronavirus 2 by RT PCR NEGATIVE NEGATIVE Final    Comment: (NOTE) SARS-CoV-2 target nucleic acids are NOT DETECTED.  The SARS-CoV-2 RNA is generally detectable in upper respiratory specimens during the acute phase of infection. The lowest concentration of SARS-CoV-2 viral copies this assay can detect is 138 copies/mL. A negative result does not preclude SARS-Cov-2 infection and should not be used as the sole basis for treatment or other patient management decisions. A negative result may occur with  improper specimen collection/handling, submission of specimen other than nasopharyngeal swab, presence of viral mutation(s) within the areas targeted by this assay, and inadequate number of viral copies(<138 copies/mL). A negative result must be combined with clinical observations, patient history, and epidemiological information. The expected result is Negative.  Fact Sheet for Patients:  BloggerCourse.com  Fact Sheet for Healthcare Providers:  SeriousBroker.it  This test is no t yet approved or cleared by the [place] FDA and  has been authorized for detection and/or diagnosis of SARS-CoV-2 by FDA under an Emergency Use Authorization (EUA). This EUA will remain  in effect (meaning this test can be used) for the duration of the COVID-19 declaration under Section 564(b)(1) of the Act, 21 U.S.C.section 360bbb-3(b)(1), unless the authorization is terminated  or revoked sooner.       Influenza A by PCR NEGATIVE NEGATIVE Final   Influenza B by PCR NEGATIVE NEGATIVE Final    Comment: (NOTE) The Xpert Xpress SARS-CoV-2/FLU/RSV plus assay is intended as an aid in the  diagnosis of influenza from Nasopharyngeal swab specimens and should not be used as a sole basis for treatment. Nasal washings and aspirates are unacceptable for Xpert Xpress SARS-CoV-2/FLU/RSV testing.  Fact Sheet for Patients: BloggerCourse.com  Fact Sheet for Healthcare Providers: SeriousBroker.it  This test is not yet approved or cleared by the [place] FDA and has been authorized for detection and/or diagnosis of SARS-CoV-2 by FDA under an Emergency Use Authorization (EUA). This EUA will remain in effect (meaning this test can be used) for the duration of the COVID-19 declaration under Section 564(b)(1) of the Act, 21 U.S.C. section 360bbb-3(b)(1), unless the authorization is terminated or revoked.  Performed at [facility], [address] [place], [place] [address]      Labs: Basic Metabolic Panel: Recent Labs  Lab 06/20/20 0119 06/21/20 0255  NA 138 140  K 3.8 4.7  CL 105 106  CO2 25 27  GLUCOSE 196* 114*  BUN 10 12  CREATININE 0.66 0.69  CALCIUM 9.1 8.7*   Liver Function Tests: Recent Labs  Lab 06/20/20 0119  AST 20  ALT 37  ALKPHOS 82  BILITOT 0.4  PROT 6.7  ALBUMIN 3.4*   Recent Labs  Lab 06/20/20 0119  LIPASE 27   No results for input(s): AMMONIA in the last 168 hours. CBC: Recent Labs  Lab 06/20/20 0119  WBC 11.9*  NEUTROABS 9.9*  HGB 11.1*  HCT 34.8*  MCV 81.5  PLT 307   Cardiac Enzymes: No results for input(s): CKTOTAL, CKMB, CKMBINDEX, TROPONINI in the last  168 hours. BNP: BNP (last 3 results) Recent Labs    11/21/19 0856  BNP 39.1    ProBNP (last 3 results) No results for input(s): PROBNP in the last 8760 hours.  CBG: Recent Labs  Lab 06/20/20 0740 06/21/20 0746  GLUCAP 120* 100*       Signed:  Meredeth Ide MD.  [facility] 06/21/2020, 5:00 PM

## 2020-06-21 NOTE — Plan of Care (Signed)
  Problem: Activity: Goal: Risk for activity intolerance will decrease Outcome: Progressing   Problem: Pain Managment: Goal: General experience of comfort will improve Outcome: Progressing   Problem: Safety: Goal: Ability to remain free from injury will improve Outcome: Progressing   

## 2020-07-13 NOTE — Progress Notes (Signed)
Primary Physician/Referring:  Patient, No Pcp Per (Inactive)  Patient ID: Judith Bentley, female    DOB: 04/19/1975, 45 y.o.   MRN: [id number]  Chief Complaint  Patient presents with  . PVC  . New Patient (Initial Visit)  . Chest Pain  . Loss of Consciousness   HPI:    Judith Bentley  is a 45 y.o. obese female with history of hyperlipidemia.  She presented to [facility] emergency department 06/20/2020 with complaints of chest pain which she described as heaviness and pressure and unclear episode of syncope.  Work-up revealed unremarkable EKG, occasional PVCs on telemetry, ACS excluded, structurally normal heart on echocardiogram, and low risk nuclear stress test.  She now presents for follow up and to establish care with our office.  Patient has not been taking metoprolol at the direction of her pharmacist as she has been taking Sudafed regularly for congestion and continued ear pain and infection.  She recently finished another course of antibiotics for ear infection.  Patient reports she has brief episodes of palpitations lasting several seconds and occurring both at rest and with exertion.  States these palpitations occasionally, chest discomfort.   Patient also complains of episodes at night where she "feels like she stops breathing".  Past Medical History:  Diagnosis Date  . COVID-19   . Cyclical vomiting syndrome    Past Surgical History:  Procedure Laterality Date  . CESAREAN SECTION    . CHOLECYSTECTOMY     Family History  Problem Relation Age of Onset  . Diabetes Mellitus II Maternal Grandfather   . Pancreatic cancer Father     Social History   Tobacco Use  . Smoking status: Never Smoker  . Smokeless tobacco: Never Used  Substance Use Topics  . Alcohol use: Not Currently   Marital Status: Married   ROS  Review of Systems  Constitutional: Negative for malaise/fatigue and weight gain.  HENT: Positive for congestion and ear pain.   Cardiovascular:  Positive for chest pain, palpitations and paroxysmal nocturnal dyspnea. Negative for claudication, leg swelling, near-syncope, orthopnea and syncope.  Respiratory: Negative for shortness of breath.   Hematologic/Lymphatic: Does not bruise/bleed easily.  Gastrointestinal: Negative for melena.  Neurological: Negative for dizziness and weakness.    Objective  Blood pressure 134/85, pulse 95, temperature 97.7 F (36.5 C), height 5\' 9"  (1.753 m), weight 264 lb (119.7 kg), SpO2 98 %.  Vitals with BMI 07/14/2020 07/14/2020 07/14/2020  Height - - 5\' 9"   Weight - - 264 lbs  BMI - - 38.97  Systolic 134 119 -  Diastolic 85 79 -  Pulse 95 92 -    Physical Exam Vitals reviewed.  Constitutional:      Appearance: She is obese.  HENT:     Head: Normocephalic and atraumatic.  Cardiovascular:     Rate and Rhythm: Normal rate and regular rhythm.     Pulses: Intact distal pulses.     Heart sounds: S1 normal and S2 normal. No murmur heard. No gallop.   Pulmonary:     Effort: Pulmonary effort is normal. No respiratory distress.     Breath sounds: No wheezing, rhonchi or rales.  Musculoskeletal:     Right lower leg: No edema.     Left lower leg: No edema.  Neurological:     Mental Status: She is alert.     Laboratory examination:   Recent Labs    11/23/19 0413 11/24/19 0444 11/25/19 0701 12/07/19 1025 06/20/20 0119 06/21/20 0255  NA 140  141   < > 141 138 140  K 3.6 3.1*   < > 4.7 3.8 4.7  CL 102 101   < > 101 105 106  CO2 32 29   < > 25 25 27   GLUCOSE 111* 122*   < > 81 196* 114*  BUN 8 8   < > 11 10 12   CREATININE 0.67 0.70   < > 0.73 0.66 0.69  CALCIUM 8.5* 8.4*   < > 9.1 9.1 8.7*  GFRNONAA >60 >60   < > 99 >60 >60  GFRAA >60 >60  --  114  --   --    < > = values in this interval not displayed.   CrCl cannot be calculated (Patient's most recent lab result is older than the maximum 21 days allowed.).  CMP Latest Ref Rng & Units 06/21/2020 06/20/2020 12/07/2019  Glucose 70 - 99  mg/dL 147(W114(H) 295(A196(H) 81  BUN 6 - 20 mg/dL 12 10 11   Creatinine 0.44 - 1.00 mg/dL 2.130.69 0.860.66 5.780.73  Sodium 135 - 145 mmol/L 140 138 141  Potassium 3.5 - 5.1 mmol/L 4.7 3.8 4.7  Chloride 98 - 111 mmol/L 106 105 101  CO2 22 - 32 mmol/L 27 25 25   Calcium 8.9 - 10.3 mg/dL 4.6(N8.7(L) 9.1 9.1  Total Protein 6.5 - 8.1 g/dL - 6.7 6.6  Total Bilirubin 0.3 - 1.2 mg/dL - 0.4 0.2  Alkaline Phos 38 - 126 U/L - 82 85  AST 15 - 41 U/L - 20 13  ALT 0 - 44 U/L - 37 25   CBC Latest Ref Rng & Units 06/20/2020 12/07/2019 11/25/2019  WBC 4.0 - 10.5 K/uL 11.9(H) 7.0 2.1(L)  Hemoglobin 12.0 - 15.0 g/dL 11.1(L) 11.9 10.5(L)  Hematocrit 36.0 - 46.0 % 34.8(L) 36.4 33.8(L)  Platelets 150 - 400 K/uL 307 351 204    Lipid Panel Recent Labs    06/21/20 0255  CHOL 213*  TRIG 141  LDLCALC 124*  VLDL 28  HDL 61  CHOLHDL 3.5    HEMOGLOBIN A1C Lab Results  Component Value Date   HGBA1C 5.6 06/20/2020   MPG 114.02 06/20/2020   TSH No results for input(s): TSH in the last 8760 hours.  External labs:  None   Medications and allergies   Allergies  Allergen Reactions  . Codeine Nausea And Vomiting     Outpatient Medications Prior to Visit  Medication Sig Dispense Refill  . albuterol (VENTOLIN HFA) 108 (90 Base) MCG/ACT inhaler Inhale 2 puffs into the lungs every 6 (six) hours as needed for wheezing or shortness of breath. 6.7 g 0  . ascorbic acid (VITAMIN C) 500 MG tablet Take 500 mg by mouth daily.    . Biotin 1 MG CAPS Take by mouth.    . Cholecalciferol (D3-1000) 25 MCG (1000 UT) capsule Take 1,000 Units by mouth daily.    . Emollient (COLLAGEN EX) Apply topically.    . Esomeprazole Magnesium (NEXIUM 24HR) 20 MG TBEC Take 40 mg by mouth 2 (two) times daily. 60 tablet 0  . fluticasone (FLONASE) 50 MCG/ACT nasal spray Place 2 sprays into both nostrils daily as needed for allergies or rhinitis.    Marland Kitchen. phenylephrine (SUDAFED PE) 10 MG TABS tablet Take 10 mg by mouth every 4 (four) hours as needed (congestion).     . Probiotic Product (PRO-BIOTIC BLEND PO) Take by mouth.    . promethazine (PHENERGAN) 25 MG tablet Take 1 tablet (25 mg total) by mouth every 8 (  eight) hours as needed for nausea or vomiting. 10 tablet 0  . vitamin B-12 (CYANOCOBALAMIN) 500 MCG tablet Take 500 mcg by mouth daily.    . metoprolol succinate (TOPROL-XL) 25 MG 24 hr tablet Take 1 tablet (25 mg total) by mouth daily. (Patient not taking: Reported on 07/14/2020) 30 tablet 2  . amoxicillin (AMOXIL) 500 MG capsule Take 500 mg by mouth See admin instructions. Bid x 5 days    . benzonatate (TESSALON PERLES) 100 MG capsule Take 1 capsule (100 mg total) by mouth 3 (three) times daily as needed for cough. 20 capsule 0   No facility-administered medications prior to visit.     Radiology:   No results found. DG Chest Portable 1 View: 06/20/2020 FINDINGS: Streaky opacities and vascular crowding in the lung bases favoring atelectatic change. No focal consolidative process is seen. No pneumothorax or visible effusion. Cardiomediastinal contours are unremarkable for portable technique. No acute osseous or soft tissue abnormality. Telemetry leads overlie the chest.  IMPRESSION: Streaky basilar opacities and vascular crowding favoring atelectasis. No other acute cardiopulmonary abnormality.   Cardiac Studies:   Nuclear stress test 06/21/2020: 1. No reversible ischemia or infarction. 2. Normal left ventricular wall motion. 3. Left ventricular ejection fraction 69% 4. Non invasive risk stratification*: Low  ECHOCARDIOGRAM COMPLETE 06/20/2020 1. Left ventricular ejection fraction, by estimation, is 60 to 65%. The left ventricle has normal function. The left ventricle has no regional wall motion abnormalities. Left ventricular diastolic parameters were normal.   2. Right ventricular systolic function is normal. The right ventricular size is normal. There is normal pulmonary artery systolic pressure. The estimated right ventricular systolic pressure  is 29.8 mmHg.   3. The mitral valve is normal in structure. No evidence of mitral valve regurgitation.   4. The aortic valve is tricuspid. Aortic valve regurgitation is not visualized. No aortic stenosis is present.   5. The inferior vena cava is normal in size with greater than 50% respiratory variability, suggesting right atrial pressure of 3 mmHg.   EKG:   07/14/2020: Sinus rhythm at a rate of 76 bpm.  Normal axis.  No evidence of ischemia or underlying injury pattern.  Compared to EKG 06/20/2020, no significant change.  Assessment     ICD-10-CM   1. Hypercholesteremia  E78.00 Lipid Panel With LDL/HDL Ratio  2. PVC (premature ventricular contraction)  I49.3 EKG 12-Lead  3. Daytime sleepiness  R40.0 Ambulatory referral to Sleep Studies  4. Elevated blood pressure reading  R03.0      Medications Discontinued During This Encounter  Medication Reason  . amoxicillin (AMOXIL) 500 MG capsule Error  . benzonatate (TESSALON PERLES) 100 MG capsule Error    No orders of the defined types were placed in this encounter.   Recommendations:   Judith Bentley is a 45 y.o. obese female with history of hyperlipidemia.  She presented to [facility] emergency department 06/20/2020 with complaints of chest pain which she described as heaviness and pressure and unclear episode of syncope.  Work-up revealed unremarkable EKG, occasional PVCs on telemetry, ACS excluded, structurally normal heart on echocardiogram, and low risk nuclear stress test. Patient has had no recurrence of syncope or near-syncope, will continue watchful waiting.   She now presents for follow up and to establish care with our office.  Patient's symptoms of palpitations are likely due to underlying PVCs which were noted on telemetry during hospitalization.  Advised patient to use Flonase on a daily basis and see if she is able to tolerate  discontinuation of Sudafed andInitiation of metoprolol succinate 25 mg daily.  Patient's blood  pressure is also mildly elevated above goal.  Metoprolol will likely improve palpitation symptoms as well as blood pressure control.  Reviewed and discussed with patient results of echocardiogram and stress test done in the hospital which revealed no evidence of ischemia and and normally ruptured heart.  In regard to patient's symptoms of chest discomfort do not suspect cardiac etiology given recent reassuring work-up and EKG without evidence of ischemia or underlying injury pattern today.  Patient continues to report episodes suggestive of apnea while sleeping.  Will refer for further sleep evaluation.  In regard to hyperlipidemia, lipids are not at goal.  Discussed with patient regarding management options including statin therapy versus diet and lifestyle modifications and repeat lipid profile testing.  Patient prefers to first try diet and lifestyle modifications including weight loss with plan to repeat lipid profile testing in 3 months.  Discussed with patient she has multiple modifiable cardiovascular risk factors including elevated blood pressure, obesity, hyperlipidemia, and potentially underlying sleep apnea.  Discussed at length with patient regarding the importance of cardiovascular risk reduction, she verbalized understanding and appears motivated to make changes.  I have also encouraged patient to primary care provider in order to address noncardiovascular concerns.  Follow-up in 3 months, sooner if needed, for hyperlipidemia, elevated blood pressure, palpitations.   Rayford Halsted, PA-C 07/14/2020, 2:45 PM Office: [phone]

## 2020-07-14 ENCOUNTER — Ambulatory Visit: Payer: 59 | Admitting: Student

## 2020-07-14 ENCOUNTER — Other Ambulatory Visit: Payer: Self-pay

## 2020-07-14 ENCOUNTER — Encounter: Payer: Self-pay | Admitting: Student

## 2020-07-14 VITALS — BP 134/85 | HR 95 | Temp 97.7°F | Ht 69.0 in | Wt 264.0 lb

## 2020-07-14 DIAGNOSIS — R030 Elevated blood-pressure reading, without diagnosis of hypertension: Secondary | ICD-10-CM

## 2020-07-14 DIAGNOSIS — E7800 Pure hypercholesterolemia, unspecified: Secondary | ICD-10-CM

## 2020-07-14 DIAGNOSIS — I493 Ventricular premature depolarization: Secondary | ICD-10-CM

## 2020-07-14 DIAGNOSIS — R400 Somnolence: Secondary | ICD-10-CM

## 2020-07-14 NOTE — Patient Instructions (Signed)
Go to [employer] [employer] in 3 months to recheck cholesterol before next visit. Go in a fasting state.

## 2020-10-14 ENCOUNTER — Ambulatory Visit: Payer: 59 | Admitting: Student

## 2020-10-14 LAB — LIPID PANEL WITH LDL/HDL RATIO
Cholesterol, Total: 294 mg/dL — ABNORMAL HIGH (ref 100–199)
HDL: 63 mg/dL (ref >39)
LDL Chol Calc (NIH): 184 mg/dL — ABNORMAL HIGH (ref 0–99)
LDL/HDL Ratio: 2.9 ratio (ref 0.0–3.2)
Triglycerides: 247 mg/dL — ABNORMAL HIGH (ref 0–149)
VLDL Cholesterol Cal: 47 mg/dL — ABNORMAL HIGH (ref 5–40)

## 2020-10-18 ENCOUNTER — Encounter: Payer: Self-pay | Admitting: Student

## 2020-10-18 ENCOUNTER — Ambulatory Visit: Payer: 59 | Admitting: Student

## 2020-10-18 ENCOUNTER — Other Ambulatory Visit: Payer: Self-pay

## 2020-10-18 VITALS — BP 124/78 | HR 87 | Temp 98.0°F | Resp 17 | Ht 69.0 in | Wt 268.2 lb

## 2020-10-18 DIAGNOSIS — I493 Ventricular premature depolarization: Secondary | ICD-10-CM

## 2020-10-18 DIAGNOSIS — E7800 Pure hypercholesterolemia, unspecified: Secondary | ICD-10-CM

## 2020-10-18 DIAGNOSIS — R400 Somnolence: Secondary | ICD-10-CM

## 2020-10-18 MED ORDER — METOPROLOL SUCCINATE ER 25 MG PO TB24: 25.0000 mg | ORAL_TABLET | Freq: Every day | ORAL | 3 refills | Status: DC

## 2020-10-18 MED ORDER — ROSUVASTATIN CALCIUM 5 MG PO TABS: 5.0000 mg | ORAL_TABLET | Freq: Every day | ORAL | 3 refills | Status: DC

## 2020-10-18 NOTE — Progress Notes (Signed)
Primary Physician/Referring:  Patient, No Pcp Per (Inactive)  Patient ID: Judith Bentley, female    DOB: November 26, 1975, 44 y.o.   MRN: [id number]  Chief Complaint  Patient presents with   Follow-up    3 month   hld   Hypertension   Palpitations   HPI:    Judith Bentley  is a 46 y.o. obese female with history of hyperlipidemia.  She presented to [facility] emergency department 06/20/2020 with complaints of chest pain which she described as heaviness and pressure and unclear episode of syncope.  Work-up revealed unremarkable EKG, occasional PVCs on telemetry, ACS excluded, structurally normal heart on echocardiogram, and low risk nuclear stress test.  Patient presents for follow-up of palpitations and hypertension.  At last office visit started patient on metoprolol which she is tolerating without issue.  Patient has had no recurrence of palpitations since initiation of metoprolol.  However she does state she continues to have episodes at night when she is sleeping particularly on her back when she "feels like she stops breathing".  Last office visit had referred patient for sleep evaluation, however this has not been done.   Patient admits she has struggled to make diet and lifestyle modifications.  She is not open to lipid management medications.  Past Medical History:  Diagnosis Date   COVID-19    Cyclical vomiting syndrome    Past Surgical History:  Procedure Laterality Date   CESAREAN SECTION     CHOLECYSTECTOMY     Family History  Problem Relation Age of Onset   High Cholesterol Mother    COPD Mother    Pancreatic cancer Father    Diabetes Mellitus II Maternal Grandfather     Social History   Tobacco Use   Smoking status: Never   Smokeless tobacco: Never  Substance Use Topics   Alcohol use: Not Currently   Marital Status: Married   ROS  Review of Systems  Constitutional: Negative for malaise/fatigue and weight gain.  Cardiovascular:  Negative for chest pain,  claudication, leg swelling, near-syncope, orthopnea, palpitations, paroxysmal nocturnal dyspnea and syncope.  Respiratory:  Positive for sleep disturbances due to breathing. Negative for shortness of breath.   Neurological:  Negative for dizziness.   Objective  Blood pressure 124/78, pulse 87, temperature 98 F (36.7 C), temperature source Temporal, resp. rate 17, height 5\' 9"  (1.753 m), weight 268 lb 3.2 oz (121.7 kg), SpO2 98 %.  Vitals with BMI 10/18/2020 07/14/2020 07/14/2020  Height 5\' 9"  - -  Weight 268 lbs 3 oz - -  BMI 39.59 - -  Systolic 124 134 07/16/2020  Diastolic 78 85 79  Pulse 87 95 92    Physical Exam Vitals reviewed.  Constitutional:      Appearance: She is obese.  HENT:     Head: Normocephalic and atraumatic.  Cardiovascular:     Rate and Rhythm: Normal rate and regular rhythm.     Pulses: Intact distal pulses.     Heart sounds: S1 normal and S2 normal. No murmur heard.   No gallop.  Pulmonary:     Effort: Pulmonary effort is normal. No respiratory distress.     Breath sounds: No wheezing, rhonchi or rales.  Musculoskeletal:     Right lower leg: No edema.     Left lower leg: No edema.  Neurological:     Mental Status: She is alert.  Unchanged compared to previous  Laboratory examination:   Recent Labs    11/23/19 0413 11/24/19 0444 11/25/19  0701 12/07/19 1025 06/20/20 0119 06/21/20 0255  NA 140 141   < > 141 138 140  K 3.6 3.1*   < > 4.7 3.8 4.7  CL 102 101   < > 101 105 106  CO2 32 29   < > 25 25 27   GLUCOSE 111* 122*   < > 81 196* 114*  BUN 8 8   < > 11 10 12   CREATININE 0.67 0.70   < > 0.73 0.66 0.69  CALCIUM 8.5* 8.4*   < > 9.1 9.1 8.7*  GFRNONAA >60 >60   < > 99 >60 >60  GFRAA >60 >60  --  114  --   --    < > = values in this interval not displayed.   CrCl cannot be calculated (Patient's most recent lab result is older than the maximum 21 days allowed.).  CMP Latest Ref Rng & Units 06/21/2020 06/20/2020 12/07/2019  Glucose 70 - 99 mg/dL 161(W114(H)  960(A196(H) 81  BUN 6 - 20 mg/dL 12 10 11   Creatinine 0.44 - 1.00 mg/dL 5.400.69 9.810.66 1.910.73  Sodium 135 - 145 mmol/L 140 138 141  Potassium 3.5 - 5.1 mmol/L 4.7 3.8 4.7  Chloride 98 - 111 mmol/L 106 105 101  CO2 22 - 32 mmol/L 27 25 25   Calcium 8.9 - 10.3 mg/dL 4.7(W8.7(L) 9.1 9.1  Total Protein 6.5 - 8.1 g/dL - 6.7 6.6  Total Bilirubin 0.3 - 1.2 mg/dL - 0.4 0.2  Alkaline Phos 38 - 126 U/L - 82 85  AST 15 - 41 U/L - 20 13  ALT 0 - 44 U/L - 37 25   CBC Latest Ref Rng & Units 06/20/2020 12/07/2019 11/25/2019  WBC 4.0 - 10.5 K/uL 11.9(H) 7.0 2.1(L)  Hemoglobin 12.0 - 15.0 g/dL 11.1(L) 11.9 10.5(L)  Hematocrit 36.0 - 46.0 % 34.8(L) 36.4 33.8(L)  Platelets 150 - 400 K/uL 307 351 204    Lipid Panel Recent Labs    06/21/20 0255 10/13/20 1042  CHOL 213* 294*  TRIG 141 247*  LDLCALC 124* 184*  VLDL 28  --   HDL 61 63  CHOLHDL 3.5  --     HEMOGLOBIN A1C Lab Results  Component Value Date   HGBA1C 5.6 06/20/2020   MPG 114.02 06/20/2020   TSH No results for input(s): TSH in the last 8760 hours.  External labs:  None  Allergies   Allergies  Allergen Reactions   Codeine Nausea And Vomiting      Medications Prior to Visit:   Outpatient Medications Prior to Visit  Medication Sig Dispense Refill   albuterol (VENTOLIN HFA) 108 (90 Base) MCG/ACT inhaler Inhale 2 puffs into the lungs every 6 (six) hours as needed for wheezing or shortness of breath. 6.7 g 0   ascorbic acid (VITAMIN C) 500 MG tablet Take 500 mg by mouth daily.     Biotin 1 MG CAPS Take by mouth.     Cholecalciferol (D3-1000) 25 MCG (1000 UT) capsule Take 1,000 Units by mouth daily.     Emollient (COLLAGEN EX) Apply topically.     Esomeprazole Magnesium (NEXIUM 24HR) 20 MG TBEC Take 40 mg by mouth 2 (two) times daily. 60 tablet 0   fluticasone (FLONASE) 50 MCG/ACT nasal spray Place 2 sprays into both nostrils daily as needed for allergies or rhinitis.     promethazine (PHENERGAN) 25 MG tablet Take 1 tablet (25 mg total) by  mouth every 8 (eight) hours as needed for nausea or vomiting. 10  tablet 0   metoprolol succinate (TOPROL-XL) 25 MG 24 hr tablet Take 1 tablet (25 mg total) by mouth daily. 30 tablet 2   phenylephrine (SUDAFED PE) 10 MG TABS tablet Take 10 mg by mouth every 4 (four) hours as needed (congestion).     Probiotic Product (PRO-BIOTIC BLEND PO) Take by mouth.     vitamin B-12 (CYANOCOBALAMIN) 500 MCG tablet Take 500 mcg by mouth daily.     No facility-administered medications prior to visit.     Final Medications at End of Visit    Current Meds  Medication Sig   albuterol (VENTOLIN HFA) 108 (90 Base) MCG/ACT inhaler Inhale 2 puffs into the lungs every 6 (six) hours as needed for wheezing or shortness of breath.   ascorbic acid (VITAMIN C) 500 MG tablet Take 500 mg by mouth daily.   Biotin 1 MG CAPS Take by mouth.   Cholecalciferol (D3-1000) 25 MCG (1000 UT) capsule Take 1,000 Units by mouth daily.   Emollient (COLLAGEN EX) Apply topically.   Esomeprazole Magnesium (NEXIUM 24HR) 20 MG TBEC Take 40 mg by mouth 2 (two) times daily.   fluticasone (FLONASE) 50 MCG/ACT nasal spray Place 2 sprays into both nostrils daily as needed for allergies or rhinitis.   promethazine (PHENERGAN) 25 MG tablet Take 1 tablet (25 mg total) by mouth every 8 (eight) hours as needed for nausea or vomiting.   rosuvastatin (CRESTOR) 5 MG tablet Take 1 tablet (5 mg total) by mouth daily.   [DISCONTINUED] metoprolol succinate (TOPROL-XL) 25 MG 24 hr tablet Take 1 tablet (25 mg total) by mouth daily.   Radiology:   No results found. DG Chest Portable 1 View: 06/20/2020 FINDINGS: Streaky opacities and vascular crowding in the lung bases favoring atelectatic change. No focal consolidative process is seen. No pneumothorax or visible effusion. Cardiomediastinal contours are unremarkable for portable technique. No acute osseous or soft tissue abnormality. Telemetry leads overlie the chest.  IMPRESSION: Streaky basilar opacities  and vascular crowding favoring atelectasis. No other acute cardiopulmonary abnormality.   Cardiac Studies:   Nuclear stress test 06/21/2020: 1. No reversible ischemia or infarction. 2. Normal left ventricular wall motion. 3. Left ventricular ejection fraction 69% 4. Non invasive risk stratification*: Low  ECHOCARDIOGRAM COMPLETE 06/20/2020 1. Left ventricular ejection fraction, by estimation, is 60 to 65%. The left ventricle has normal function. The left ventricle has no regional wall motion abnormalities. Left ventricular diastolic parameters were normal.   2. Right ventricular systolic function is normal. The right ventricular size is normal. There is normal pulmonary artery systolic pressure. The estimated right ventricular systolic pressure is 29.8 mmHg.   3. The mitral valve is normal in structure. No evidence of mitral valve regurgitation.   4. The aortic valve is tricuspid. Aortic valve regurgitation is not visualized. No aortic stenosis is present.   5. The inferior vena cava is normal in size with greater than 50% respiratory variability, suggesting right atrial pressure of 3 mmHg.    EKG:   07/14/2020: Sinus rhythm at a rate of 76 bpm.  Normal axis.  No evidence of ischemia or underlying injury pattern.  Compared to EKG 06/20/2020, no significant change.  Assessment     ICD-10-CM   1. Hypercholesteremia  E78.00 Lipid Panel With LDL/HDL Ratio    Lipid Panel With LDL/HDL Ratio    2. PVC (premature ventricular contraction)  I49.3     3. Daytime sleepiness  R40.0        Medications Discontinued During This Encounter  Medication Reason   phenylephrine (SUDAFED PE) 10 MG TABS tablet Error   Probiotic Product (PRO-BIOTIC BLEND PO) Error   vitamin B-12 (CYANOCOBALAMIN) 500 MCG tablet Error   metoprolol succinate (TOPROL-XL) 25 MG 24 hr tablet Reorder    Meds ordered this encounter  Medications   rosuvastatin (CRESTOR) 5 MG tablet    Sig: Take 1 tablet (5 mg total) by mouth  daily.    Dispense:  30 tablet    Refill:  3   metoprolol succinate (TOPROL-XL) 25 MG 24 hr tablet    Sig: Take 1 tablet (25 mg total) by mouth daily.    Dispense:  90 tablet    Refill:  3    Recommendations:   Buford Gayler is a 45 y.o. obese female with history of hyperlipidemia.  She presented to [facility] emergency department 06/20/2020 with complaints of chest pain which she described as heaviness and pressure and unclear episode of syncope.  Work-up revealed unremarkable EKG, occasional PVCs on telemetry, ACS excluded, structurally normal heart on echocardiogram, and low risk nuclear stress test.  Patient presents for follow-up.  She is tolerating metoprolol without issue and her blood pressure is well controlled.  She has had no recurrence of palpitations since initiation of beta-blocker therapy, will continue metoprolol.  Reviewed with patient lipids remain uncontrolled.  Given patient's difficulty with making diet and lifestyle modifications recommend initiation of low-dose statin therapy, will start rosuvastatin 5 mg daily.  We will repeat lipid profile testing in 3 months.  Reiterated the importance of diet and lifestyle modifications as well as weight loss to patient.  In regard to sleep disturbances, will follow-up on referral to sleep evaluation.  Follow-up in 6 months, sooner if needed, for PVCs, hyperlipidemia.   Rayford Halsted, PA-C 10/18/2020, 3:52 PM Office: [phone]

## 2020-10-20 ENCOUNTER — Ambulatory Visit: Payer: 59 | Admitting: Student

## 2021-04-19 ENCOUNTER — Ambulatory Visit: Payer: 59 | Admitting: Student

## 2021-04-26 IMAGING — CR DG CHEST 2V
2 series · 2 of 2 positions shown · non-contrast
Comparison: Radiograph 12/07/2019.

CLINICAL DATA: Shortness of breath. COVID [place]ogan. Persistent
dyspnea with exertion and morning cough.

EXAM:
CHEST - 2 VIEW

[w chest pa]
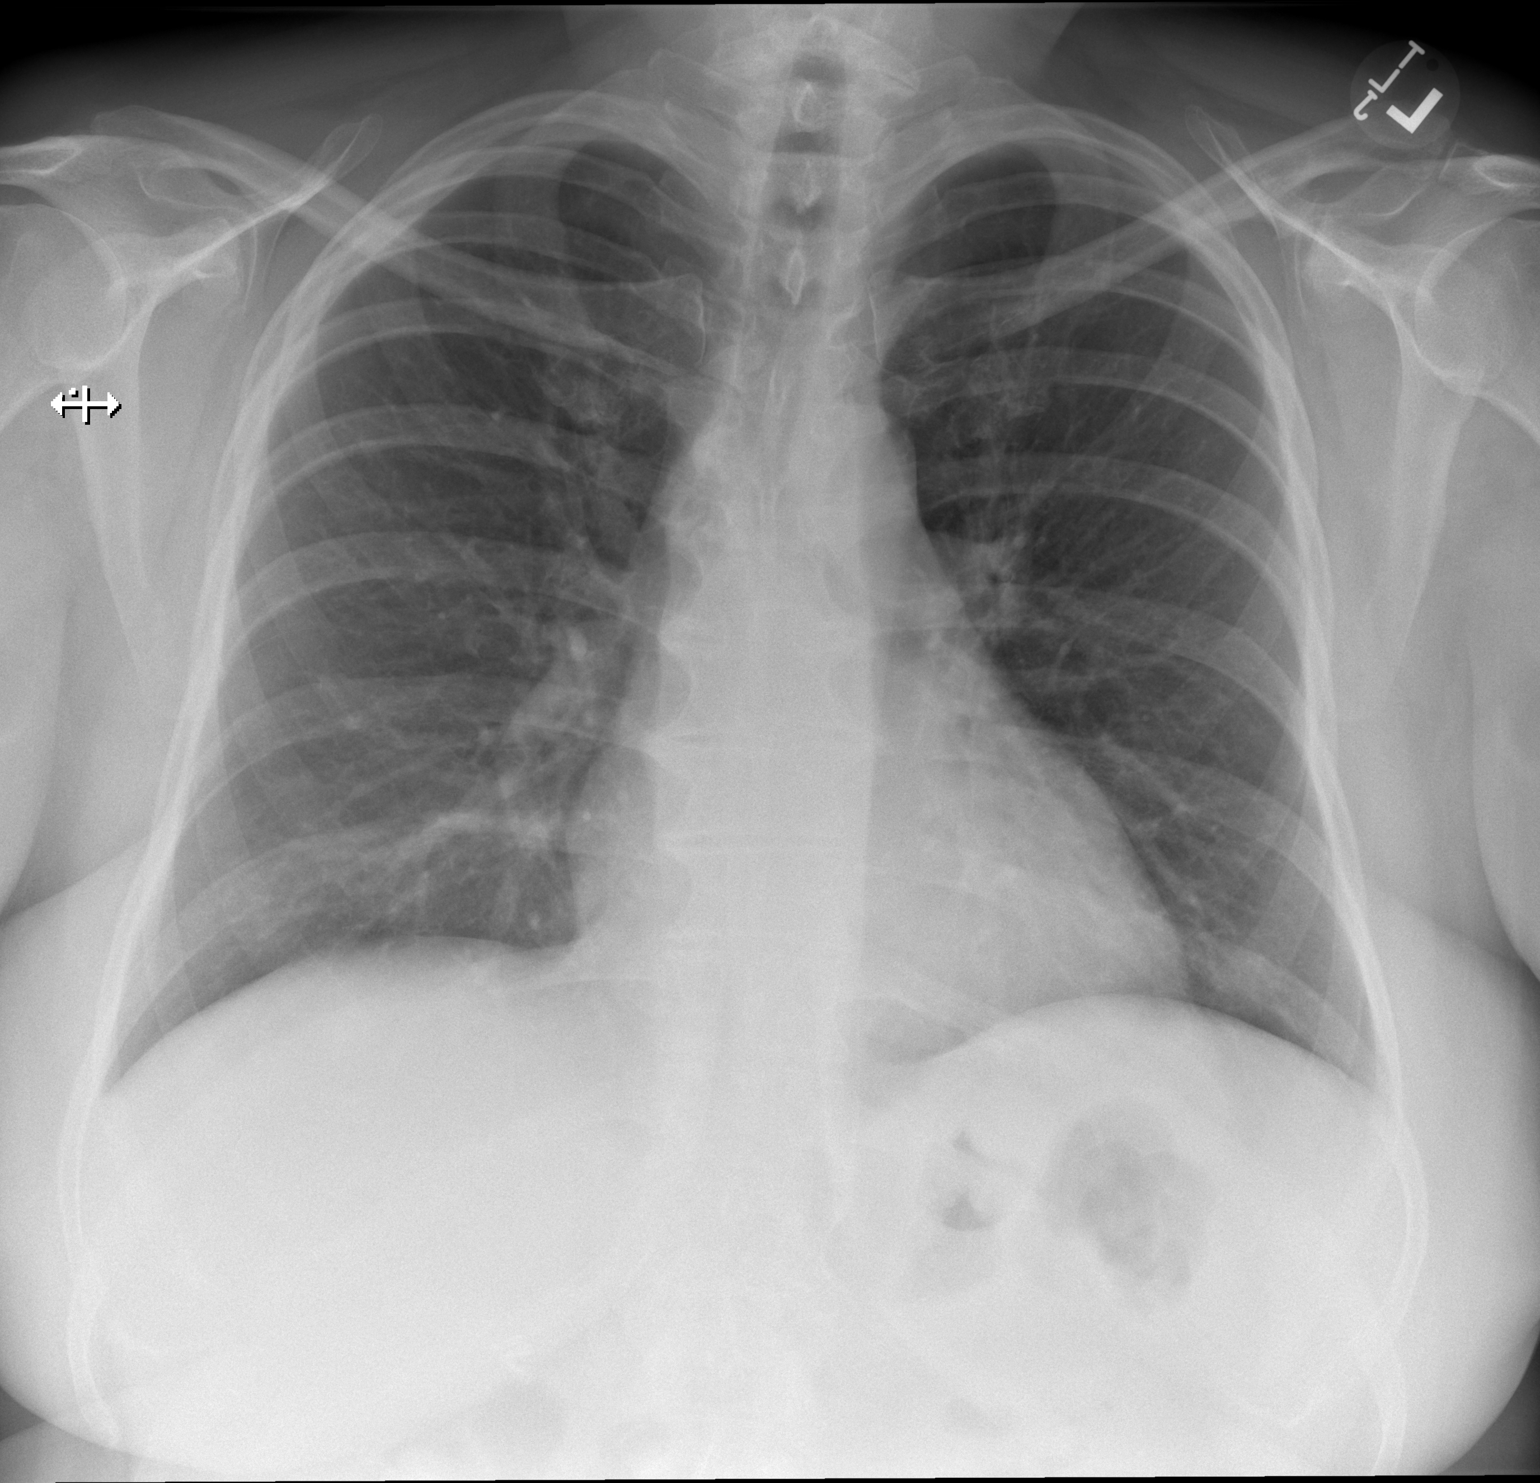

[w chest lat]
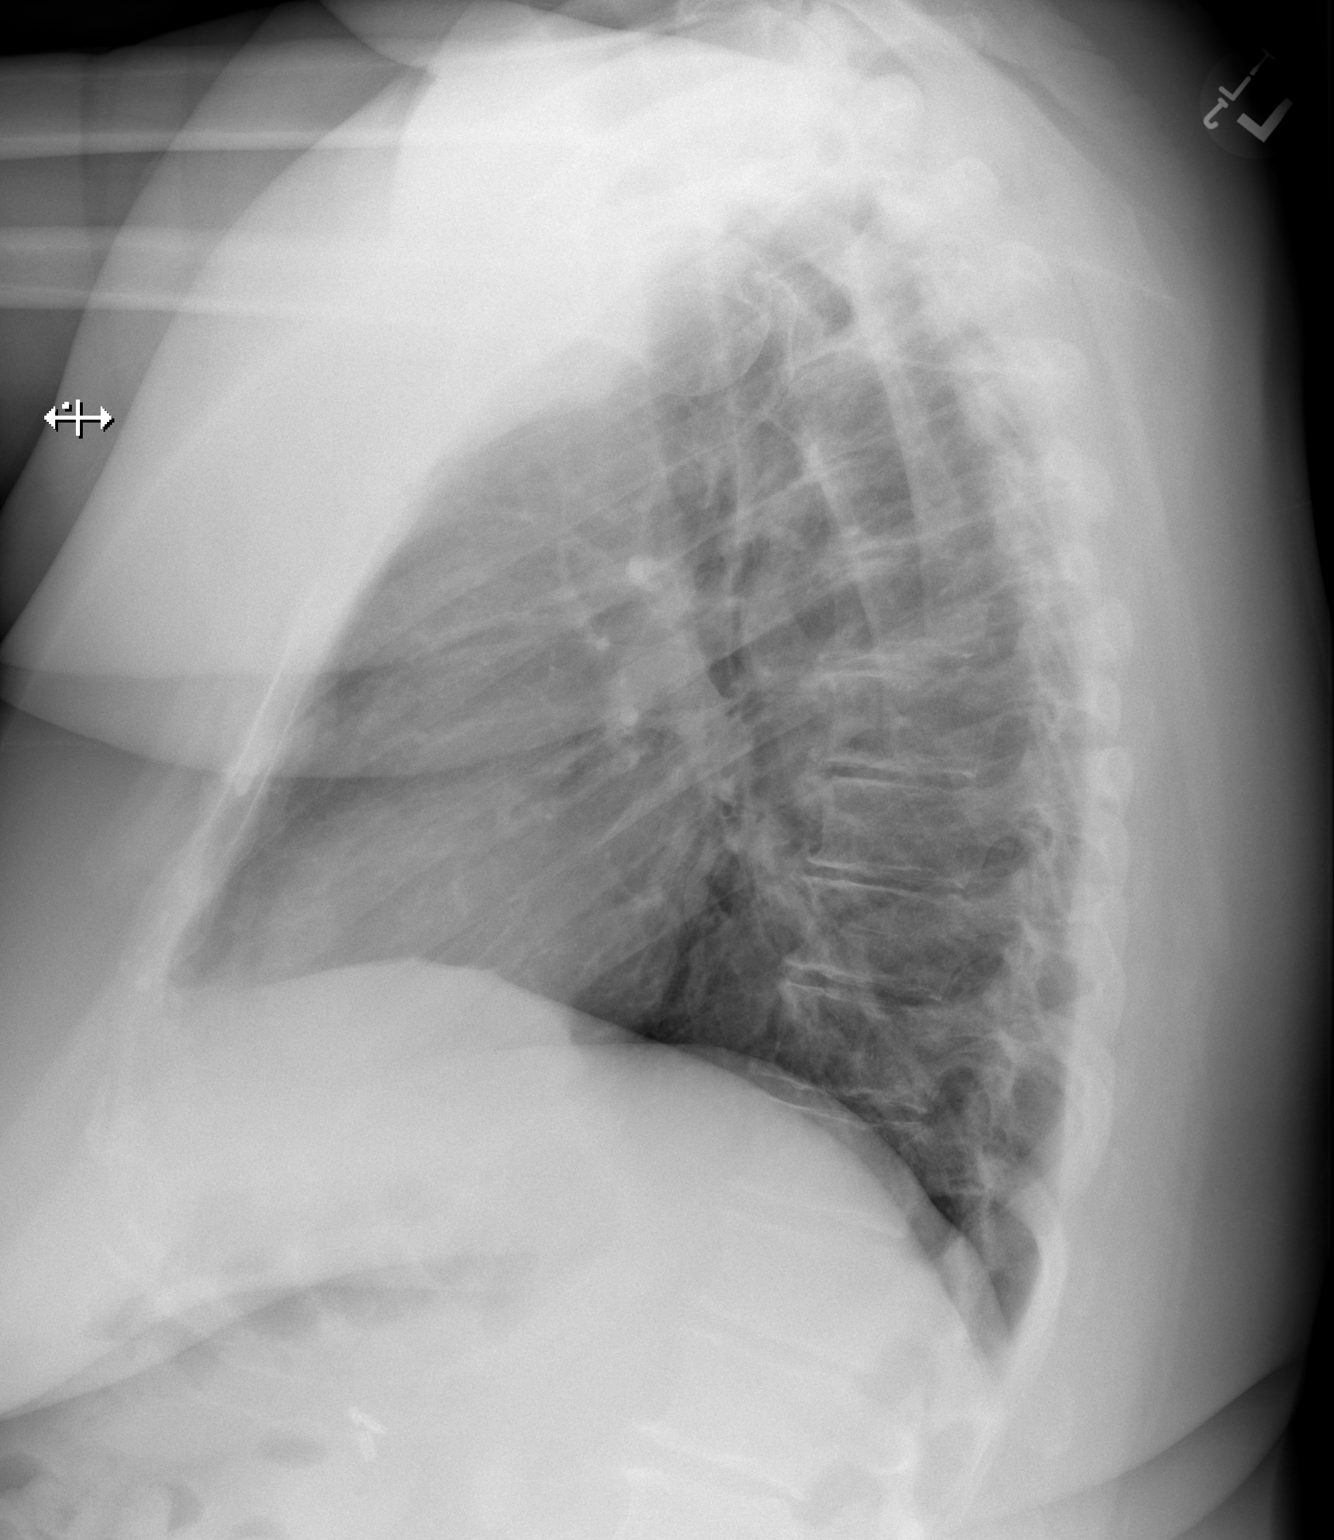

[2 of 2 positions shown; findings below may reference images not displayed]

FINDINGS: Resolved subsegmental opacities at the lung bases in the interim. No
residual or new airspace disease. Heart is normal in size. Normal
mediastinal contours. No pulmonary edema, pleural fluid or
pneumothorax. No acute osseous abnormalities are seen.
IMPRESSION: Resolve soft tech mental opacity at the lung bases. No residual
sequela of COVID pneumonia. No acute findings.

## 2021-10-09 IMAGING — DX DG CHEST 1V PORT
1 series · 1 of 1 positions shown · non-contrast
Comparison: Radiograph 01/06/2020

CLINICAL DATA: Chest pain

EXAM:
PORTABLE CHEST 1 VIEW

[chest ap]
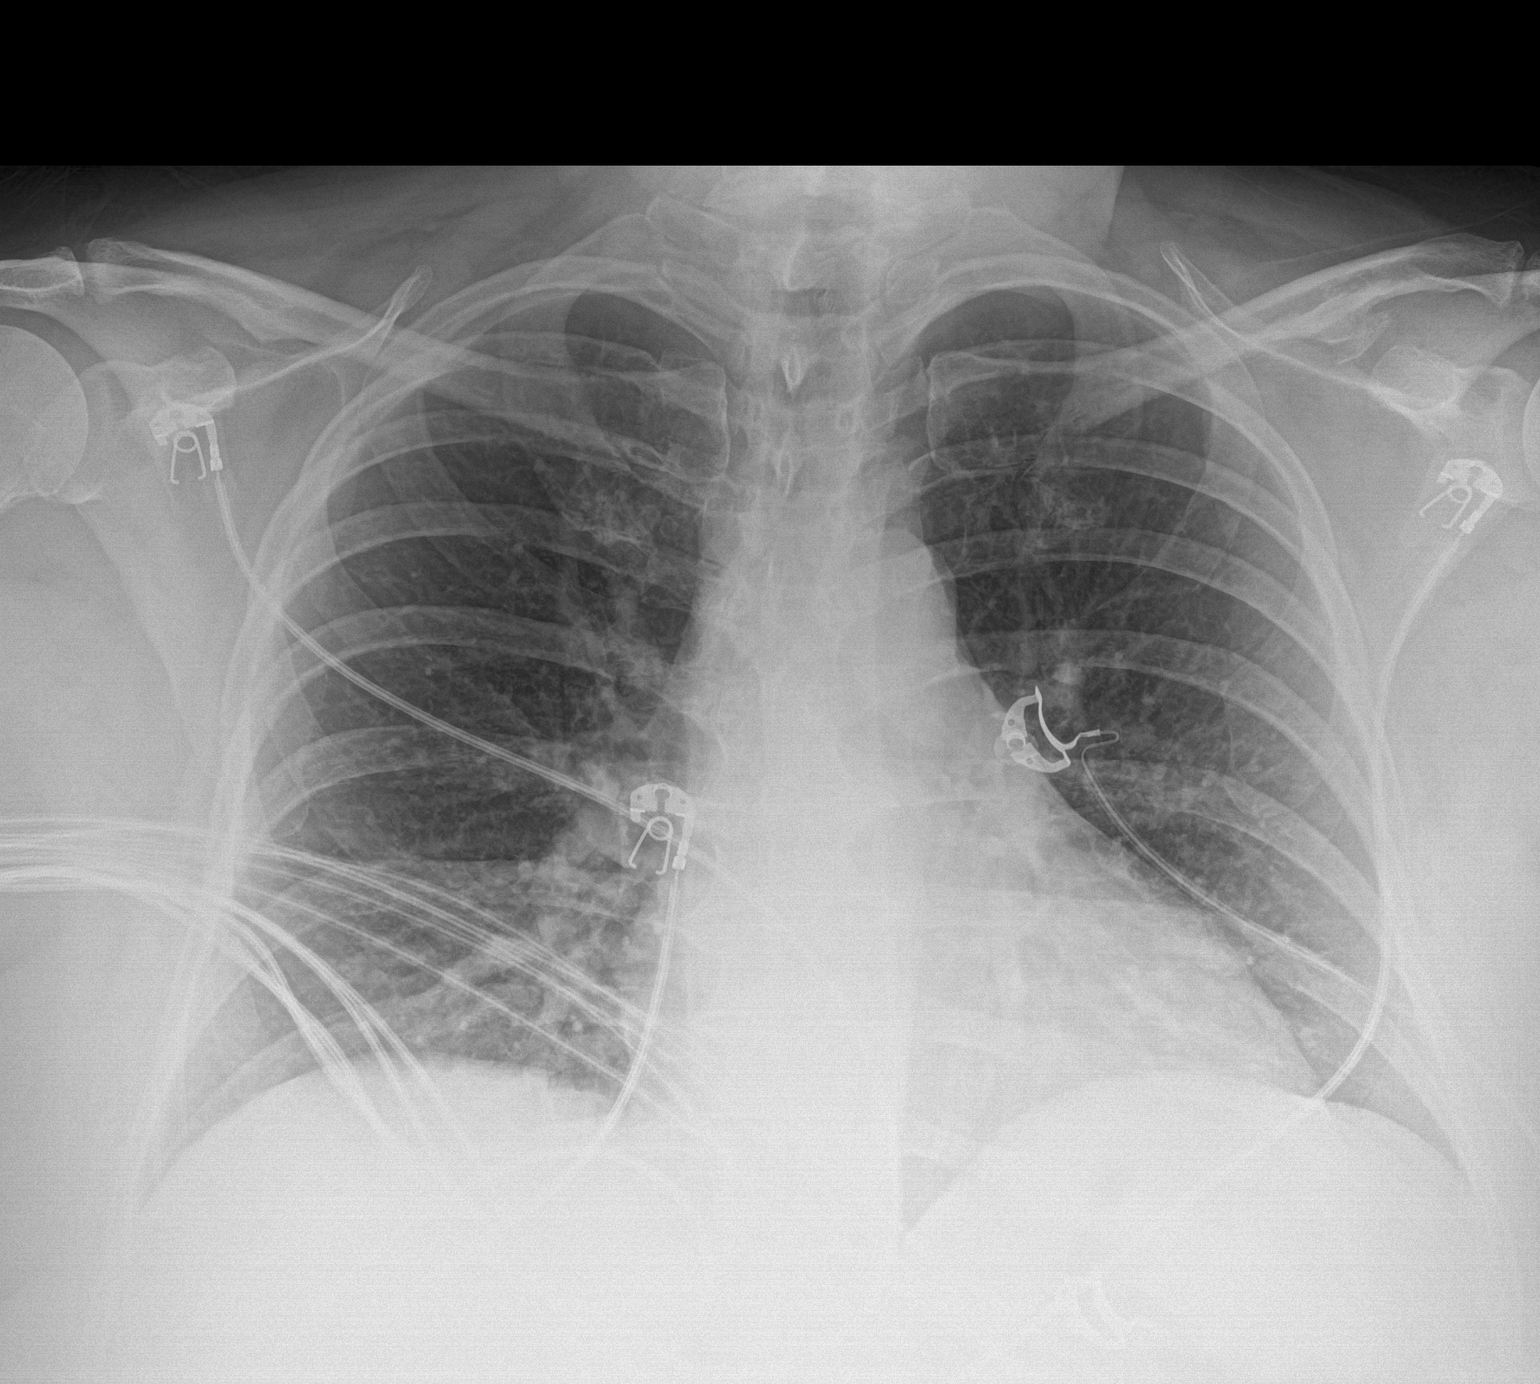

[1 of 1 positions shown; findings below may reference images not displayed]

FINDINGS: Streaky opacities and vascular crowding in the lung bases favoring
atelectatic change. No focal consolidative process is seen. No
pneumothorax or visible effusion. Cardiomediastinal contours are
unremarkable for portable technique. No acute osseous or soft tissue
abnormality. Telemetry leads overlie the chest.
IMPRESSION: Streaky basilar opacities and vascular crowding favoring
atelectasis.

No other acute cardiopulmonary abnormality.

## 2022-06-11 ENCOUNTER — Encounter: Payer: Self-pay | Admitting: Cardiology

## 2022-06-11 ENCOUNTER — Ambulatory Visit: Payer: 59 | Admitting: Cardiology

## 2022-06-11 VITALS — BP 118/84 | HR 118 | Resp 16 | Ht 69.0 in | Wt 276.0 lb

## 2022-06-11 DIAGNOSIS — I493 Ventricular premature depolarization: Secondary | ICD-10-CM

## 2022-06-11 DIAGNOSIS — R0681 Apnea, not elsewhere classified: Secondary | ICD-10-CM

## 2022-06-11 DIAGNOSIS — R0609 Other forms of dyspnea: Secondary | ICD-10-CM

## 2022-06-11 DIAGNOSIS — R000 Tachycardia, unspecified: Secondary | ICD-10-CM

## 2022-06-11 DIAGNOSIS — R0683 Snoring: Secondary | ICD-10-CM

## 2022-06-11 MED ORDER — FUROSEMIDE 20 MG PO TABS
20.0000 mg | ORAL_TABLET | Freq: Every day | ORAL | 3 refills | Status: DC
Start: 2022-06-11 — End: 2023-06-27

## 2022-06-11 NOTE — Addendum Note (Signed)
Addended by: Elder Negus on: 06/11/2022 02:56 PM   Modules accepted: Orders

## 2022-06-11 NOTE — Progress Notes (Addendum)
Follow up visit  Subjective:   Judith Bentley, female    DOB: 1975/02/26, 47 y.o.   MRN: [id number]   HPI  Chief Complaint  Patient presents with   PVC   Hyperlipidemia   Palpitations   Follow-up    6 month    47 y.o. Caucasian female with shortness of breath.  Patient was last seen in 2022.  Lexiscan nuclear stress test and echocardiogram were unremarkable at that time.  She was noted to have symptomatic PVCs.  Patient made today's appointment after recent increase in exertional dyspnea symptoms with minimal activity.  She also has orthopnea, occasional leg swelling.  In addition, she reports snoring  apneic episodes at night, as noticed by her husband. Separately, she has had longstanding intractable vomiting and nausea.  GI workup had showed small hiatal hernia.  About a month ago, patient had severe nausea and vomiting episode followed by brief loss of consciousness.  She denies any significant chest pain.    Current Outpatient Medications:    albuterol (VENTOLIN HFA) 108 (90 Base) MCG/ACT inhaler, Inhale 2 puffs into the lungs every 6 (six) hours as needed for wheezing or shortness of breath., Disp: 6.7 g, Rfl: 0   ascorbic acid (VITAMIN C) 500 MG tablet, Take 500 mg by mouth daily., Disp: , Rfl:    Biotin 1 MG CAPS, Take by mouth., Disp: , Rfl:    Cholecalciferol (D3-1000) 25 MCG (1000 UT) capsule, Take 1,000 Units by mouth daily., Disp: , Rfl:    Emollient (COLLAGEN EX), Apply topically., Disp: , Rfl:    Esomeprazole Magnesium (NEXIUM 24HR) 20 MG TBEC, Take 40 mg by mouth 2 (two) times daily., Disp: 60 tablet, Rfl: 0   fluticasone (FLONASE) 50 MCG/ACT nasal spray, Place 2 sprays into both nostrils daily as needed for allergies or rhinitis., Disp: , Rfl:    metoprolol succinate (TOPROL-XL) 25 MG 24 hr tablet, Take 1 tablet (25 mg total) by mouth daily., Disp: 90 tablet, Rfl: 3   promethazine (PHENERGAN) 25 MG tablet, Take 1 tablet (25 mg total) by mouth every 8 (eight)  hours as needed for nausea or vomiting., Disp: 10 tablet, Rfl: 0   rosuvastatin (CRESTOR) 5 MG tablet, Take 1 tablet (5 mg total) by mouth daily., Disp: 30 tablet, Rfl: 3   Cardiovascular & other pertient studies:  Reviewed external labs and tests, independently interpreted  EKG 06/11/2022: Sinus tachycardia 106 bpm  Otherwise normal EKG  Echocardiogram 06/20/2020: 1. Left ventricular ejection fraction, by estimation, is 60 to 65%. The  left ventricle has normal function. The left ventricle has no regional  wall motion abnormalities. Left ventricular diastolic parameters were  normal.   2. Right ventricular systolic function is normal. The right ventricular  size is normal. There is normal pulmonary artery systolic pressure. The  estimated right ventricular systolic pressure is 29.8 mmHg.   3. The mitral valve is normal in structure. No evidence of mitral valve  regurgitation.   4. The aortic valve is tricuspid. Aortic valve regurgitation is not  visualized. No aortic stenosis is present.   5. The inferior vena cava is normal in size with greater than 50%  respiratory variability, suggesting right atrial pressure of 3 mmHg.   Nuclear stress test 06/21/2020: 1. No reversible ischemia or infarction. 2. Normal left ventricular wall motion. 3. Left ventricular ejection fraction 69% 4. Non invasive risk stratification*: Low  Recent labs: 10/20/2021: Glucose 113, BUN/Cr 15/0.59. EGFR >90 Na/K 139/K. Rest of the CMP normal  H/H 11.8/35. MCV 81. Platelets 270 Lipase 268 (11-82)   Review of Systems  Cardiovascular:  Positive for dyspnea on exertion, leg swelling and syncope. Negative for chest pain and palpitations.         Vitals:   06/11/22 1111 06/11/22 1116  BP: (!) 132/92 118/84  Pulse: 96 (!) 118  Resp: 16   SpO2: 97% 97%    Body mass index is 40.76 kg/m. Filed Weights   06/11/22 1111  Weight: 276 lb (125.2 kg)    Objective:   Physical Exam Vitals and nursing note  reviewed.  Constitutional:      General: She is not in acute distress. Neck:     Vascular: No JVD.  Cardiovascular:     Rate and Rhythm: Normal rate and regular rhythm.     Heart sounds: Normal heart sounds. No murmur heard. Pulmonary:     Effort: Pulmonary effort is normal.     Breath sounds: Normal breath sounds. No wheezing or rales.  Musculoskeletal:     Right lower leg: No edema.     Left lower leg: No edema.             Visit diagnoses:   ICD-10-CM   1. PVC (premature ventricular contraction)  I49.3 EKG 12-Lead    2. Tachycardia  R00.0 PCV ECHOCARDIOGRAM COMPLETE    TSH    3. Exertional dyspnea  R06.09 PCV ECHOCARDIOGRAM COMPLETE    furosemide (LASIX) 20 MG tablet    CBC    Basic metabolic panel    Pro b natriuretic peptide (BNP)[facility])       Orders Placed This Encounter  Procedures   CBC   Basic metabolic panel   Pro b natriuretic peptide (BNP)[facility])   TSH   EKG 12-Lead   PCV ECHOCARDIOGRAM COMPLETE     Medication changes this visit: There are no discontinued medications.  Meds ordered this encounter  Medications   furosemide (LASIX) 20 MG tablet    Sig: Take 1 tablet (20 mg total) by mouth daily.    Dispense:  30 tablet    Refill:  3     Assessment & Recommendations:   47 y.o. Caucasian female with exertional dyspnea, orthopnea, sinus tachycardia syncope  Syncope: Occurred in the setting of severe intractable nausea.  Suspect vasovagal episode.  Recommended evaluation.  Needs follow-up with GI.  Exertional dyspnea: Along with orthopnea, symptoms concerning for congestive heart failure, although physical exam is unremarkable.  Sinus tachycardia on EKG.  Started Lasix 40 mg daily.  Check CBC, BMP, proBNP, TSH.  Check echocardiogram.  Snoring and apneic episodes at night: Refer to sleep study.  Further recommendations after above testing.         Elder Negus, MD Pager:  [phone] Office: [phone]

## 2022-06-15 ENCOUNTER — Encounter: Payer: Self-pay | Admitting: Family Medicine

## 2022-06-15 ENCOUNTER — Ambulatory Visit: Payer: 59 | Admitting: Family Medicine

## 2022-06-15 VITALS — BP 130/84 | HR 72 | Temp 97.5°F | Ht 69.0 in | Wt 276.8 lb

## 2022-06-15 DIAGNOSIS — R1032 Left lower quadrant pain: Secondary | ICD-10-CM | POA: Diagnosis not present

## 2022-06-15 DIAGNOSIS — R1115 Cyclical vomiting syndrome unrelated to migraine: Secondary | ICD-10-CM

## 2022-06-15 DIAGNOSIS — I493 Ventricular premature depolarization: Secondary | ICD-10-CM | POA: Diagnosis not present

## 2022-06-15 DIAGNOSIS — R55 Syncope and collapse: Secondary | ICD-10-CM

## 2022-06-15 DIAGNOSIS — R110 Nausea: Secondary | ICD-10-CM | POA: Insufficient documentation

## 2022-06-15 DIAGNOSIS — R0681 Apnea, not elsewhere classified: Secondary | ICD-10-CM | POA: Diagnosis not present

## 2022-06-15 DIAGNOSIS — R079 Chest pain, unspecified: Secondary | ICD-10-CM

## 2022-06-15 DIAGNOSIS — F419 Anxiety disorder, unspecified: Secondary | ICD-10-CM | POA: Insufficient documentation

## 2022-06-15 DIAGNOSIS — G8929 Other chronic pain: Secondary | ICD-10-CM | POA: Insufficient documentation

## 2022-06-15 DIAGNOSIS — R4589 Other symptoms and signs involving emotional state: Secondary | ICD-10-CM

## 2022-06-15 HISTORY — DX: Other symptoms and signs involving emotional state: R45.89

## 2022-06-15 NOTE — Assessment & Plan Note (Signed)
Refer the patient for a sleep study to assess for sleep apnea and its potential contribution to the patient's symptoms.

## 2022-06-15 NOTE — Assessment & Plan Note (Signed)
Referral to gastroenterology for further evaluation of chronic, unexplained left lower quadrant abdominal pain.

## 2022-06-15 NOTE — Assessment & Plan Note (Signed)
Given the potential link with nausea and exhaustion, recommend ensuring no underlying cardiac issue. Consider the potential contribution of anxiety to symptomatology and offer referral for non-pharmacological anxiety management such as therapy if patient is interested in exploring this.

## 2022-06-15 NOTE — Assessment & Plan Note (Signed)
Continue cardiology follow-up and the scheduled echocardiogram to further evaluate recent palpitations and unusual cardiac sensations. Patient will continue with Lasix as prescribed.

## 2022-06-15 NOTE — Patient Instructions (Addendum)
For difficulty sleeping, we are referring for sleep study. For abdominal pain and vomiting, we are referring to you gastroenterology. Please follow-up with cardiology workup and continue take your Lasix. For intermittent chest pain and palpitations, please follow-up with cardiology.  If severe go to the emergency department. The office will call you to help schedule the appointments. After this, please follow-up for physical and routine checkup. If you would like to discuss anxiety treatments or would like referral, you may follow-up sooner.

## 2022-06-15 NOTE — Progress Notes (Signed)
Assessment/Plan:   Problem List Items Addressed This Visit       Cardiovascular and Mediastinum   PVC (premature ventricular contraction)     Digestive   Cyclical vomiting syndrome   Relevant Orders   Ambulatory referral to Gastroenterology     Other   Syncope    Given the potential link with nausea and exhaustion, recommend ensuring no underlying cardiac issue. Consider the potential contribution of anxiety to symptomatology and offer referral for non-pharmacological anxiety management such as therapy if patient is interested in exploring this.      Chest pain    Continue cardiology follow-up and the scheduled echocardiogram to further evaluate recent palpitations and unusual cardiac sensations. Patient will continue with Lasix as prescribed.      Apnea - Primary    Refer the patient for a sleep study to assess for sleep apnea and its potential contribution to the patient's symptoms.      Relevant Orders   Ambulatory referral to Sleep Studies   Abdominal pain, chronic, left lower quadrant    Referral to gastroenterology for further evaluation of chronic, unexplained left lower quadrant abdominal pain.      Relevant Orders   Ambulatory referral to Gastroenterology   Anxious appearance    There are no discontinued medications.  Return in about 3 months (around 09/14/2022) for physical (fasting labs).    Subjective:   Encounter date: 06/15/2022  Judith Bentley is a 48 y.o. female who has Syncope; Anemia; Pneumonia due to COVID-19 virus; Class 2 obesity due to excess calories with body mass index (BMI) of 38.0 to 38.9 in adult; Cyclical vomiting syndrome; Dyspnea on exertion; Cough; Postviral fatigue syndrome; Chest pain; Nausea; Apnea; PVC (premature ventricular contraction); Abdominal pain, chronic, left lower quadrant; and Anxious appearance on their problem list..   She  has a past medical history of Allergy, Anxious appearance (06/15/2022), COVID-19, Cyclical  vomiting syndrome, GERD (gastroesophageal reflux disease), Hyperlipidemia (8/22?), Hypertension, and Sleep apnea..  CHIEF COMPLAINT: Patient presents to establish care with concerns regarding sleep apnea and has a history of syncopal episodes. The patient also reports episodes of severe nausea and vomiting, cardiac evaluation, and persistent left lower quadrant abdominal pain.  HISTORY OF PRESENT ILLNESS:  Sleep Apnea: Patient has difficulty sleeping, particularly while lying on her back due to breathing difficulties. She experiences episodes where she stops breathing and wakes up feeling tired. Even when sleeping on her side, the patient reports experiencing breathing difficulties.  Cardiac Evaluation: Patient recently evaluated by cardiology.  She was recently started on Lasix and has a scheduled echocardiogram. She notes recent episodes of fluttery feelings and an unusual sensation at the grocery store that felt like a "huge heartbeat." The patient has experienced increased chest pain over the past week, which she attributes potentially to stress or poor sleep.  Syncope: The patient has a history of syncope which began after contracting COVID-19 in 2021. She has had multiple episodes of fainting, sometimes associated with nausea and vomiting, a while others seem to be triggered by anxiety inducing events.  Syncope occurred most recently on Easter.  Cyclical vomiting and abdominal Pain: Patient describes a chronic, intermittent left lower quadrant pain with intermittent periods of frequent vomiting ongoing for over 20 years, which has become more frequent in the past 12-13 years.  Patient reports being evaluated by gastroenterology about 3 years ago with normal endoscopy and colonoscopy.   Review of Systems  Respiratory:  Positive for shortness of breath.   Cardiovascular:  Positive for chest pain (No chest pain or palpitations at the visit today) and palpitations.  Gastrointestinal:  Positive for  abdominal pain (Left lower quadrant), nausea and vomiting.  Psychiatric/Behavioral:  The patient is nervous/anxious.   All other systems reviewed and are negative.     06/15/2022   10:21 AM 12/07/2019    9:54 AM  Depression screen PHQ 2/9  Decreased Interest 0 0  Down, Depressed, Hopeless 0 0  PHQ - 2 Score 0 0  Altered sleeping 3   Tired, decreased energy 3   Change in appetite 0   Feeling bad or failure about yourself  0   Trouble concentrating 1   Moving slowly or fidgety/restless 0   Suicidal thoughts 0   PHQ-9 Score 7   Difficult doing work/chores Somewhat difficult       06/15/2022   10:21 AM  GAD 7 : Generalized Anxiety Score  Nervous, Anxious, on Edge 1  Control/stop worrying 1  Worry too much - different things 1  Trouble relaxing 1  Restless 0  Easily annoyed or irritable 1  Afraid - awful might happen 2  Total GAD 7 Score 7  Anxiety Difficulty Somewhat difficult     Past Surgical History:  Procedure Laterality Date   CESAREAN SECTION     CHOLECYSTECTOMY     TUBAL LIGATION  03/08/05    Outpatient Medications Prior to Visit  Medication Sig Dispense Refill   ascorbic acid (VITAMIN C) 500 MG tablet Take 500 mg by mouth daily.     Cholecalciferol (D3-1000) 25 MCG (1000 UT) capsule Take 1,000 Units by mouth daily.     furosemide (LASIX) 20 MG tablet Take 1 tablet (20 mg total) by mouth daily. 30 tablet 3   omeprazole (PRILOSEC) 20 MG capsule Take 20 mg by mouth daily.     promethazine (PHENERGAN) 25 MG tablet Take 1 tablet (25 mg total) by mouth every 8 (eight) hours as needed for nausea or vomiting. 10 tablet 0   metoprolol succinate (TOPROL-XL) 25 MG 24 hr tablet Take 1 tablet (25 mg total) by mouth daily. (Patient not taking: Reported on 06/11/2022) 90 tablet 3   rosuvastatin (CRESTOR) 5 MG tablet Take 1 tablet (5 mg total) by mouth daily. (Patient not taking: Reported on 06/11/2022) 30 tablet 3   No facility-administered medications prior to visit.     Family History  Problem Relation Age of Onset   High Cholesterol Mother    COPD Mother    Depression Mother    Hyperlipidemia Mother    Hypertension Mother    Pancreatic cancer Father    Diabetes Mellitus II Maternal Grandfather     Social History   Socioeconomic History   Marital status: Married    Spouse name: Not on file   Number of children: 6   Years of education: Not on file   Highest education level: Not on file  Occupational History   Occupation: housewife  Tobacco Use   Smoking status: Never   Smokeless tobacco: Never   Tobacco comments:    Never smoked  Vaping Use   Vaping Use: Never used  Substance and Sexual Activity   Alcohol use: Never   Drug use: Never   Sexual activity: Yes    Birth control/protection: Other-see comments    Comment: Tubes tied and suspected menopause  Other Topics Concern   Not on file  Social History Narrative   Not on file   Social Determinants of Health   Financial  Resource Strain: Not on file  Food Insecurity: Not on file  Transportation Needs: Not on file  Physical Activity: Not on file  Stress: Not on file  Social Connections: Not on file  Intimate Partner Violence: Not on file                                                                                                  Objective:  Physical Exam: BP 130/84 (BP Location: Left Arm, Patient Position: Sitting, Cuff Size: Large)   Pulse 72   Temp (!) 97.5 F (36.4 C) (Temporal)   Ht 5\' 9"  (1.753 m)   Wt 276 lb 12.8 oz (125.6 kg)   SpO2 99%   BMI 40.88 kg/m     Physical Exam Constitutional:      General: She is not in acute distress.    Appearance: Normal appearance. She is not ill-appearing or toxic-appearing.  HENT:     Head: Normocephalic and atraumatic.     Nose: Nose normal. No congestion.  Eyes:     General: No scleral icterus.    Extraocular Movements: Extraocular movements intact.  Cardiovascular:     Rate and Rhythm: Normal rate and regular  rhythm.     Pulses: Normal pulses.     Heart sounds: Normal heart sounds.  Pulmonary:     Effort: Pulmonary effort is normal. No respiratory distress.     Breath sounds: Normal breath sounds.  Abdominal:     General: Abdomen is flat. Bowel sounds are normal.     Palpations: Abdomen is soft.  Musculoskeletal:        General: Normal range of motion.  Lymphadenopathy:     Cervical: No cervical adenopathy.  Skin:    General: Skin is warm and dry.     Findings: No rash.  Neurological:     General: No focal deficit present.     Mental Status: She is alert and oriented to person, place, and time. Mental status is at baseline.  Psychiatric:        Mood and Affect: Mood is anxious.        Behavior: Behavior normal.        Thought Content: Thought content normal.        Judgment: Judgment normal.     No results found.  No results found for this or any previous visit (from the past 2160 hour(s)).      Garner Nash, MD, MS

## 2022-06-28 ENCOUNTER — Ambulatory Visit: Payer: 59 | Admitting: Family Medicine

## 2022-07-03 ENCOUNTER — Ambulatory Visit: Payer: 59

## 2022-07-03 DIAGNOSIS — R0609 Other forms of dyspnea: Secondary | ICD-10-CM

## 2022-07-03 DIAGNOSIS — R000 Tachycardia, unspecified: Secondary | ICD-10-CM

## 2022-07-10 ENCOUNTER — Ambulatory Visit: Payer: 59 | Admitting: Neurology

## 2022-07-10 ENCOUNTER — Encounter: Payer: Self-pay | Admitting: Neurology

## 2022-07-10 VITALS — BP 144/85 | HR 80 | Ht 69.0 in | Wt 279.2 lb

## 2022-07-10 DIAGNOSIS — Z820 Family history of epilepsy and other diseases of the nervous system: Secondary | ICD-10-CM

## 2022-07-10 DIAGNOSIS — R0683 Snoring: Secondary | ICD-10-CM

## 2022-07-10 DIAGNOSIS — Z9189 Other specified personal risk factors, not elsewhere classified: Secondary | ICD-10-CM | POA: Diagnosis not present

## 2022-07-10 DIAGNOSIS — G4719 Other hypersomnia: Secondary | ICD-10-CM | POA: Diagnosis not present

## 2022-07-10 DIAGNOSIS — R002 Palpitations: Secondary | ICD-10-CM

## 2022-07-10 DIAGNOSIS — R351 Nocturia: Secondary | ICD-10-CM

## 2022-07-10 DIAGNOSIS — R519 Headache, unspecified: Secondary | ICD-10-CM

## 2022-07-10 DIAGNOSIS — R0681 Apnea, not elsewhere classified: Secondary | ICD-10-CM | POA: Diagnosis not present

## 2022-07-10 NOTE — Progress Notes (Signed)
Subjective:    Patient ID: Judith Bentley is a 47 y.o. female.  HPI    Huston Foley, MD, PhD [facility] [address], [address] [address] [place], [place] [address]  Dear Dr. Rosemary Holms,  I saw your patient, Judith Bentley, upon you kind request, in my sleep clinic today for initial consultation of her sleep disorder, in particular, concern for underlying obstructive sleep apnea. As you know, Judith Bentley is a 47 year old female with an underlying medical history of palpitations with PVCs, hyperlipidemia, hypertension, allergies, reflux disease, cyclical vomiting syndrome, history of syncope, dyspnea on exertion, leg swelling, and severe obesity with a BMI of over 40, who reports snoring and excessive daytime somnolence as well as witnessed apneas.  Her Epworth sleepiness score is 11 out of 24, fatigue severity score is 63 out of 63.  I reviewed your office note from 06/11/2022.  She has occasional morning headaches.  She has nocturia about 2-4 times per average night.  She has 6 daughters, 2 of them live at home.  She is expecting her third grandchild, she already has 2 granddaughters and her third granddaughter was due in August.  She does not currently work.  Her bedtime and rise time are variable.  She may be in bed somewhere between 7 and 10 PM and rise time generally is between 7 and 7:30 AM.  They have 1 dog in the household and the dog often sleeps in the bed with them.  She has a TV on in her bedroom, her husband often likes to have it on to fall asleep to.  Sometimes she turns it off in the middle of the night.  She has gained quite a bit of weight in the past 12 to 14 months, in the realm of 30 pounds.  She is working on weight loss.  She is a non-smoker and does not drink any alcohol.  She drinks no daily caffeine, occasional tea.  Her mom has sleep apnea and has a CPAP machine.  She has woken up with a sense of gasping for air and difficulty breathing,  especially when she sleeps on her back.  She often wakes up with a severely dry mouth.  Her Past Medical History Is Significant For: Past Medical History:  Diagnosis Date   Allergy    Seasonal allergies   Anxious appearance 06/15/2022   COVID-19    Cyclical vomiting syndrome    GERD (gastroesophageal reflux disease)    Hyperlipidemia 8/22?   Hypertension    Sleep apnea    I have not been diagnosed, but have symptoms    Her Past Surgical History Is Significant For: Past Surgical History:  Procedure Laterality Date   CESAREAN SECTION     CHOLECYSTECTOMY     TUBAL LIGATION  03/08/05    Her Family History Is Significant For: Family History  Problem Relation Age of Onset   High Cholesterol Mother    COPD Mother    Depression Mother    Hyperlipidemia Mother    Hypertension Mother    Sleep apnea Mother    Pancreatic cancer Father    Diabetes Mellitus II Maternal Grandfather     Her Social History Is Significant For: Social History   Socioeconomic History   Marital status: Married    Spouse name: Not on file   Number of children: 6   Years of education: Not on file   Highest education level: Not on file  Occupational History   Occupation: housewife  Tobacco Use   Smoking status: Never   Smokeless tobacco: Never   Tobacco comments:    Never smoked  Vaping Use   Vaping Use: Never used  Substance and Sexual Activity   Alcohol use: Never   Drug use: Never   Sexual activity: Yes    Birth control/protection: Other-see comments    Comment: Tubes tied and suspected menopause  Other Topics Concern   Not on file  Social History Narrative   Not on file   Social Determinants of Health   Financial Resource Strain: Not on file  Food Insecurity: Not on file  Transportation Needs: Not on file  Physical Activity: Not on file  Stress: Not on file  Social Connections: Not on file    Her Allergies Are:  Allergies  Allergen Reactions   Codeine Nausea And Vomiting  :    Her Current Medications Are:  Outpatient Encounter Medications as of 07/10/2022  Medication Sig   ascorbic acid (VITAMIN C) 500 MG tablet Take 500 mg by mouth daily.   furosemide (LASIX) 20 MG tablet Take 1 tablet (20 mg total) by mouth daily.   Multiple Vitamin (MULTIVITAMIN PO) Take by mouth.   omeprazole (PRILOSEC) 20 MG capsule Take 20 mg by mouth daily.   promethazine (PHENERGAN) 25 MG tablet Take 1 tablet (25 mg total) by mouth every 8 (eight) hours as needed for nausea or vomiting.   Cholecalciferol (D3-1000) 25 MCG (1000 UT) capsule Take 1,000 Units by mouth daily.   metoprolol succinate (TOPROL-XL) 25 MG 24 hr tablet Take 1 tablet (25 mg total) by mouth daily. (Patient not taking: Reported on 06/11/2022)   rosuvastatin (CRESTOR) 5 MG tablet Take 1 tablet (5 mg total) by mouth daily. (Patient not taking: Reported on 06/11/2022)   No facility-administered encounter medications on file as of 07/10/2022.  :   Review of Systems:  Out of a complete 14 point review of systems, all are reviewed and negative with the exception of these symptoms as listed below:  Review of Systems  Neurological:        Pt here for sleep consult Pt snores,headaches.fatigue,hypertension Pt denies sleep study ,CPAP machine    ESS:11 FSS: 63    Objective:  Neurological Exam  Physical Exam Physical Examination:   Vitals:   07/10/22 1325  BP: (!) 144/85  Pulse: 80    General Examination: The patient is a very pleasant 47 y.o. female in no acute distress. She appears well-developed and well-nourished and well groomed.   HEENT: Normocephalic, atraumatic, pupils are equal, round and reactive to light, extraocular tracking is good without limitation to gaze excursion or nystagmus noted. Hearing is grossly intact. Face is symmetric with normal facial animation. Speech is clear with no dysarthria noted. There is no hypophonia. There is no lip, neck/head, jaw or voice tremor. Neck is supple with full  range of passive and active motion. There are no carotid bruits on auscultation. Oropharynx exam reveals: mild mouth dryness, good dental hygiene and moderate airway crowding, due to small airway entry, tonsils on the smaller side, Mallampati class III.  Neck circumference 17-1/4 inches.  Mild overbite noted.  Tongue protrudes centrally and palate elevates symmetrically.  Chest: Clear to auscultation without wheezing, rhonchi or crackles noted.  Heart: S1+S2+0, regular and normal without murmurs, rubs or gallops noted.   Abdomen: Soft, non-tender and non-distended.  Extremities: There is no pitting edema in the distal lower extremities bilaterally.   Skin: Warm and dry without trophic changes noted.  Musculoskeletal: exam reveals no obvious joint deformities.   Neurologically:  Mental status: The patient is awake, alert and oriented in all 4 spheres. Her immediate and remote memory, attention, language skills and fund of knowledge are appropriate. There is no evidence of aphasia, agnosia, apraxia or anomia. Speech is clear with normal prosody and enunciation. Thought process is linear. Mood is normal and affect is normal.  Cranial nerves II - XII are as described above under HEENT exam.  Motor exam: Normal bulk, strength and tone is noted. There is no obvious action or resting tremor.  Fine motor skills and coordination: grossly intact.  Cerebellar testing: No dysmetria or intention tremor. There is no truncal or gait ataxia.  Sensory exam: intact to light touch in the upper and lower extremities.  Gait, station and balance: She stands easily. No veering to one side is noted. No leaning to one side is noted. Posture is age-appropriate and stance is narrow based. Gait shows normal stride length and normal pace. No problems turning are noted.   Assessment and Plan:  In summary, Jaiyla Belson is a very pleasant 47 y.o.-year old female with an underlying medical history of palpitations with  PVCs, hyperlipidemia, hypertension, allergies, reflux disease, cyclical vomiting syndrome, history of syncope, dyspnea on exertion, leg swelling, and severe obesity with a BMI of over 40, whose history and physical exam are concerning for sleep disordered breathing, particularly obstructive sleep apnea (OSA). While a laboratory attended sleep study is typically considered "gold standard" for evaluation of sleep disordered breathing, we mutually agreed to pursue a home sleep test in the interest of time.   I had a long chat with the patient about my findings and the diagnosis of sleep apnea, particularly OSA, its prognosis and treatment options. We talked about medical/conservative treatments, surgical interventions and non-pharmacological approaches for symptom control. I explained, in particular, the risks and ramifications of untreated moderate to severe OSA, especially with respect to developing cardiovascular disease down the road, including congestive heart failure (CHF), difficult to treat hypertension, cardiac arrhythmias (particularly A-fib), neurovascular complications including TIA, stroke and dementia. Even type 2 diabetes has, in part, been linked to untreated OSA. Symptoms of untreated OSA may include (but may not be limited to) daytime sleepiness, nocturia (i.e. frequent nighttime urination), memory problems, mood irritability and suboptimally controlled or worsening mood disorder such as depression and/or anxiety, lack of energy, lack of motivation, physical discomfort, as well as recurrent headaches, especially morning or nocturnal headaches. We talked about the importance of maintaining a healthy lifestyle and striving for healthy weight. In addition, we talked about the importance of striving for and maintaining good sleep hygiene. I recommended a sleep study at this time. I outlined the differences between a laboratory attended sleep study which is considered more comprehensive and accurate over  the option of a home sleep test (HST); the latter may lead to underestimation of sleep disordered breathing in some instances and does not help with diagnosing upper airway resistance syndrome and is not accurate enough to diagnose primary central sleep apnea typically. I outlined possible surgical and non-surgical treatment options of OSA, including the use of a positive airway pressure (PAP) device (i.e. CPAP, AutoPAP/APAP or BiPAP in certain circumstances), a custom-made dental device (aka oral appliance, which would require a referral to a specialist dentist or orthodontist typically, and is generally speaking not considered for patients with full dentures or edentulous state), upper airway surgical options, such as traditional UPPP (which is not considered a first-line  treatment) or the Inspire device (hypoglossal nerve stimulator, which would involve a referral for consultation with an ENT surgeon, after careful selection, following inclusion criteria - also not first-line treatment). I explained the PAP treatment option to the patient in detail, as this is generally considered first-line treatment.  The patient indicated that she would be willing to try PAP therapy, if the need arises. I explained the importance of being compliant with PAP treatment, not only for insurance purposes but primarily to improve patient's symptoms symptoms, and for the patient's long term health benefit, including to reduce Her cardiovascular risks longer-term.    We will pick up our discussion about the next steps and treatment options after testing.  We will keep her posted as to the test results by phone call and/or MyChart messaging where possible.  We will plan to follow-up in sleep clinic accordingly as well.  I answered all her questions today and the patient was in agreement.   I encouraged her to call with any interim questions, concerns, problems or updates or email [place] through MyChart.  Generally speaking, sleep test  authorizations may take up to 2 weeks, sometimes less, sometimes longer, the patient is encouraged to get in touch with [place] if they do not hear back from the sleep lab staff directly within the next 2 weeks.  Thank you very much for allowing me to participate in the care of this nice patient. If I can be of any further assistance to you please do not hesitate to call me at [phone].  Sincerely,   Huston Foley, MD, PhD

## 2022-07-10 NOTE — Patient Instructions (Signed)

## 2022-07-17 ENCOUNTER — Telehealth: Payer: Self-pay | Admitting: Neurology

## 2022-07-17 NOTE — Telephone Encounter (Signed)
MAILOUT- [employer] no auth req   Patient is on the schedule for 07/31/22.

## 2022-07-19 LAB — BASIC METABOLIC PANEL
BUN/Creatinine Ratio: 22 (ref 9–23)
BUN: 15 mg/dL (ref 6–24)
CO2: 26 mmol/L (ref 20–29)
Calcium: 9.4 mg/dL (ref 8.7–10.2)
Chloride: 102 mmol/L (ref 96–106)
Creatinine, Ser: 0.68 mg/dL (ref 0.57–1.00)
Glucose: 100 mg/dL — ABNORMAL HIGH (ref 70–99)
Potassium: 4.2 mmol/L (ref 3.5–5.2)
Sodium: 141 mmol/L (ref 134–144)
eGFR: 107 mL/min/{1.73_m2} (ref >59)

## 2022-07-19 LAB — CBC
Hematocrit: 37.9 % (ref 34.0–46.6)
Hemoglobin: 12.3 g/dL (ref 11.1–15.9)
MCH: 26.9 pg (ref 26.6–33.0)
MCHC: 32.5 g/dL (ref 31.5–35.7)
MCV: 83 fL (ref 79–97)
Platelets: 308 10*3/uL (ref 150–450)
RBC: 4.57 x10E6/uL (ref 3.77–5.28)
RDW: 14.5 % (ref 11.7–15.4)
WBC: 6.4 10*3/uL (ref 3.4–10.8)

## 2022-07-19 LAB — TSH: TSH: 3.33 u[IU]/mL (ref 0.450–4.500)

## 2022-07-19 LAB — PRO B NATRIURETIC PEPTIDE: NT-Pro BNP: 41 pg/mL (ref 0–249)

## 2022-07-25 ENCOUNTER — Encounter: Payer: Self-pay | Admitting: Cardiology

## 2022-07-25 ENCOUNTER — Ambulatory Visit: Payer: 59 | Admitting: Cardiology

## 2022-07-25 VITALS — BP 123/76 | HR 100 | Resp 16 | Ht 69.0 in | Wt 275.0 lb

## 2022-07-25 DIAGNOSIS — R072 Precordial pain: Secondary | ICD-10-CM

## 2022-07-25 DIAGNOSIS — R0609 Other forms of dyspnea: Secondary | ICD-10-CM

## 2022-07-25 DIAGNOSIS — E782 Mixed hyperlipidemia: Secondary | ICD-10-CM

## 2022-07-25 NOTE — Progress Notes (Signed)
Follow up visit  Subjective:   Judith Bentley, female    DOB: Nov 02, 1975, 47 y.o.   MRN: [id number]   HPI  Chief Complaint  Patient presents with   PVC   Hyperlipidemia   Follow-up    4-6 week    47 y.o. Caucasian female with shortness of breath, chest pain  At last visit, patient had primarily complained of exertional dyspnea. Workup was unremarkable, details below. Now, patient also reports exertional chest pain.  Current Outpatient Medications:    furosemide (LASIX) 20 MG tablet, Take 1 tablet (20 mg total) by mouth daily., Disp: 30 tablet, Rfl: 3   Multiple Vitamin (MULTIVITAMIN PO), Take by mouth., Disp: , Rfl:    omeprazole (PRILOSEC) 20 MG capsule, Take 20 mg by mouth daily., Disp: , Rfl:    promethazine (PHENERGAN) 25 MG tablet, Take 1 tablet (25 mg total) by mouth every 8 (eight) hours as needed for nausea or vomiting., Disp: 10 tablet, Rfl: 0   Cardiovascular & other pertient studies:  Reviewed external labs and tests, independently interpreted  EKG 06/11/2022: Sinus tachycardia 106 bpm  Otherwise normal EKG  Echocardiogram 07/03/2022: Normal LV systolic function with visual EF 55-60%. Left ventricle cavity is normal in size. Mild concentric hypertrophy of the left ventricle. Normal global wall motion. Normal diastolic filling pattern, normal LAP. Calculated EF 55%. Left atrial cavity is slightly dilated. Structurally normal tricuspid valve with trace regurgitation. No evidence of pulmonary hypertension. The aortic root is mildly dilated at 3.5 cm. Mildly dilated ascending aorta at 3.7 cm. No prior available for comparison. Personally reviewed. Aortic root and ascending aorta is likely upper limit normal.   Nuclear stress test 06/21/2020: 1. No reversible ischemia or infarction. 2. Normal left ventricular wall motion. 3. Left ventricular ejection fraction 69% 4. Non invasive risk stratification*: Low  Recent labs: 07/18/2022: Glucose 100, BUN/Cr  15/0.68. EGFR 107. Na/K 141/4.2.  ProBNP 41 normal H/H 12/37. MCV 83. Platelets 308 TSH 3.3 normal  10/20/2021: Glucose 113, BUN/Cr 15/0.59. EGFR >90 Na/K 139/K. Rest of the CMP normal H/H 11.8/35. MCV 81. Platelets 270 Lipase 268 (11-82)   Review of Systems  Cardiovascular:  Positive for chest pain, dyspnea on exertion and syncope. Negative for leg swelling and palpitations.         Vitals:   07/25/22 1101  BP: 123/76  Pulse: 100  Resp: 16  SpO2: 96%    Body mass index is 40.61 kg/m. Filed Weights   07/25/22 1101  Weight: 275 lb (124.7 kg)    Objective:   Physical Exam Vitals and nursing note reviewed.  Constitutional:      General: She is not in acute distress. Neck:     Vascular: No JVD.  Cardiovascular:     Rate and Rhythm: Normal rate and regular rhythm.     Heart sounds: Normal heart sounds. No murmur heard. Pulmonary:     Effort: Pulmonary effort is normal.     Breath sounds: Normal breath sounds. No wheezing or rales.  Musculoskeletal:     Right lower leg: No edema.     Left lower leg: No edema.             Visit diagnoses:   ICD-10-CM   1. Precordial pain  R07.2 PCV MYOCARDIAL PERFUSION WO LEXISCAN    CT CARDIAC SCORING (SELF PAY ONLY)    Lipid panel    2. Exertional dyspnea  R06.09        Orders Placed This Encounter  Procedures  CT CARDIAC SCORING (SELF PAY ONLY)   Lipid panel   PCV MYOCARDIAL PERFUSION WO LEXISCAN     Medication changes this visit: Medications Discontinued During This Encounter  Medication Reason   ascorbic acid (VITAMIN C) 500 MG tablet    Cholecalciferol (D3-1000) 25 MCG (1000 UT) capsule    metoprolol succinate (TOPROL-XL) 25 MG 24 hr tablet    rosuvastatin (CRESTOR) 5 MG tablet     No orders of the defined types were placed in this encounter.    Assessment & Recommendations:   47 y.o. Caucasian female with exertional dyspnea, orthopnea, sinus tachycardia syncope  Syncope: Occurred in the  setting of severe intractable nausea.  Suspect vasovagal episode. No recurrence.   Chest pain: Recommend exercise nuclear stress test, CT cardiac scoring.  Exertional dyspnea: Workup not suggestive of heart failure with normal proBNP and echocardiogram.  Further recommendations after above testing     Elder Negus, MD Pager: [phone] Office: [phone]

## 2022-07-31 ENCOUNTER — Ambulatory Visit (INDEPENDENT_AMBULATORY_CARE_PROVIDER_SITE_OTHER): Payer: 59 | Admitting: Neurology

## 2022-07-31 DIAGNOSIS — G4719 Other hypersomnia: Secondary | ICD-10-CM

## 2022-07-31 DIAGNOSIS — Z820 Family history of epilepsy and other diseases of the nervous system: Secondary | ICD-10-CM

## 2022-07-31 DIAGNOSIS — R351 Nocturia: Secondary | ICD-10-CM

## 2022-07-31 DIAGNOSIS — R0683 Snoring: Secondary | ICD-10-CM

## 2022-07-31 DIAGNOSIS — R519 Headache, unspecified: Secondary | ICD-10-CM

## 2022-07-31 DIAGNOSIS — G4733 Obstructive sleep apnea (adult) (pediatric): Secondary | ICD-10-CM

## 2022-07-31 DIAGNOSIS — Z9189 Other specified personal risk factors, not elsewhere classified: Secondary | ICD-10-CM

## 2022-07-31 DIAGNOSIS — R002 Palpitations: Secondary | ICD-10-CM

## 2022-07-31 DIAGNOSIS — R0681 Apnea, not elsewhere classified: Secondary | ICD-10-CM

## 2022-07-31 DIAGNOSIS — G4734 Idiopathic sleep related nonobstructive alveolar hypoventilation: Secondary | ICD-10-CM

## 2022-08-07 NOTE — Procedures (Signed)
[facility]  HOME SLEEP TEST (Watch PAT) REPORT  -  Mail-out Device  STUDY DATE: 08/03/22  DOB: 06-May-1975  MRN: [id number]  ORDERING CLINICIAN: Huston Foley, MD, PhD   REFERRING CLINICIAN: Dr. Truett Mainland  CLINICAL INFORMATION/HISTORY: 47 year old female with an underlying medical history of palpitations with PVCs, hyperlipidemia, hypertension, allergies, reflux disease, cyclical vomiting syndrome, history of syncope, dyspnea on exertion, leg swelling, and severe obesity with a BMI of over 40, who reports snoring and excessive daytime somnolence as well as witnessed apneas.   Epworth sleepiness score: 11/24.  BMI: 41.5 kg/m  FINDINGS:   Sleep Summary:   Total Recording Time (hours, min): 9 hours, 59 min  Total Sleep Time (hours, min):  9 hours, 31 min  Percent REM (%):    Inconclusive REM detection   Respiratory Indices:   Calculated pAHI (per hour):  48.1/hour         REM pAHI:    N/A       NREM pAHI: 48.1/hour  Central pAHI: 1.3/hour  Oxygen Saturation Statistics:    Oxygen Saturation (%) Mean: 93%   Minimum oxygen saturation (%):                 76%   O2 Saturation Range (%): 76 - 98%    O2 Saturation (minutes) <=88%: 17.6 min  Pulse Rate Statistics:   Pulse Mean (bpm):    70/min    Pulse Range (48 - 106/min)   IMPRESSION: OSA (obstructive sleep apnea), severe Nocturnal Hypoxemia  RECOMMENDATION:  This home sleep test demonstrates severe obstructive sleep apnea with a total AHI of 48.1/hour and O2 nadir of 76% with time below or at 88% saturation of over 15 minutes, indicating nocturnal hypoxemia.  Snoring was detected, in the moderate to loud range, rarely milder.  Treatment with positive airway pressure is highly recommended. The patient will be advised to proceed with an autoPAP titration/trial at home. A laboratory attended titration study can be considered in the future for optimization of treatment settings and to  improve tolerance and compliance. Alternative treatment options are limited secondary to the severity of the patient's sleep disordered breathing, but may include surgical treatment with an implantable hypoglossal nerve stimulator (in carefully selected candidates, meeting criteria).  Concomitant weight loss is recommended, where clinically appropriate. Please note, that untreated obstructive sleep apnea may carry additional perioperative morbidity. Patients with significant obstructive sleep apnea should receive perioperative PAP therapy and the surgeons and particularly the anesthesiologist should be informed of the diagnosis and the severity of the sleep disordered breathing. The patient should be cautioned not to drive, work at heights, or operate dangerous or heavy equipment when tired or sleepy. Review and reiteration of good sleep hygiene measures should be pursued with any patient. While waiting for autoPAP set up at home, I recommend patient sleep with HOB mildly elevated between 30-45 degrees and primarily sleep on her sides.  Other causes of the patient's symptoms, including circadian rhythm disturbances, an underlying mood disorder, medication effect and/or an underlying medical problem cannot be ruled out based on this test. Clinical correlation is recommended.  The patient and her referring provider will be notified of the test results. The patient will be seen in follow up in sleep clinic at [facility].  I certify that I have reviewed the raw data recording prior to the issuance of this report in accordance with the standards of the American Academy of Sleep Medicine (AASM).  INTERPRETING PHYSICIAN:   Huston Foley,  MD, PhD Medical Director, [facility]) Diplomat, ABPN (Neurology and Sleep)   [facility] [address] [place], [place] [address] [phone]

## 2022-08-07 NOTE — Addendum Note (Signed)
Addended by: Huston Foley on: 08/07/2022 05:36 PM   Modules accepted: Orders

## 2022-08-08 ENCOUNTER — Telehealth: Payer: Self-pay | Admitting: *Deleted

## 2022-08-08 NOTE — Telephone Encounter (Signed)
Zott, Hennie Duos, RN; [place], [place] Got it Thank You     Previous Messages    ----- Message ----- From: Guy Begin, RN Sent: 08/08/2022  10:18 AM EDT To: Marlou Porch Zott Subject: new autopap user  SEVERE                      New order in EPIC for pt  Judith Bentley Female, 47 y.o., Dec 10, 1975 MRN: [id number] Phone: [phone] Thank you Delmer Islam

## 2022-08-08 NOTE — Telephone Encounter (Signed)
-----   Message from Huston Foley, MD sent at 08/07/2022  5:36 PM EDT ----- Urgent set up requested on PAP therapy, due to severe OSA. Patient referred by cardiology, seen by me on 07/10/22, patient had a HST on 08/03/22.    Please call and notify the patient that the recent home sleep test showed obstructive sleep apnea in the severe range. I recommend treatment for this in the form of autoPAP, which means, that we don't have to bring her in for a sleep study with CPAP, but will let her start using a so called autoPAP machine at home, through a DME company (of her choice, or as per insurance requirement). The DME representative will fit the patient with a mask of choice, educate her on how to use the machine, how to put the mask on, etc. I have placed an order in the chart. Please send the order to a local DME, talk to patient, send report to referring MD. Please also reinforce the need for compliance with treatment. We will need a FU in sleep clinic for 10 weeks post-PAP set up, please arrange that with me or one of our NPs. While waiting for autoPAP set up at home, I recommend patient sleep with HOB mildly elevated between 30-45 degrees and primarily sleep on her sides. Working aggressively on wt loss is also highly recommended.  Thanks,   Huston Foley, MD, PhD [facility])

## 2022-08-08 NOTE — Telephone Encounter (Signed)
I called pt. I advised pt that Dr. Frances Furbish reviewed their sleep study results and found that pt has severe OSA. Dr. Frances Furbish recommends that pt start autopap. I reviewed PAP compliance expectations with the pt. Pt is agreeable to starting an auto-PAP. I advised pt that an order will be sent to a DME, [facility], and they will call the pt within about one week after they file with the pt's insurance. Elease Etienne will show the pt how to use the machine, fit for masks, and troubleshoot the auto-PAP if needed. A follow up appt will be made for insurance purposes withus 31-89 days.  Phone staff to call and set up. Pt verbalized understanding to arrive 15 minutes early and bring their auto-PAP. Pt verbalized understanding of results. Pt had no questions at this time but was encouraged to call back if questions arise. I have sent the order to [facility] and have received confirmation that they have received the order.

## 2022-08-11 LAB — LIPID PANEL
Chol/HDL Ratio: 5 ratio — ABNORMAL HIGH (ref 0.0–4.4)
Cholesterol, Total: 272 mg/dL — ABNORMAL HIGH (ref 100–199)
HDL: 54 mg/dL (ref >39)
LDL Chol Calc (NIH): 133 mg/dL — ABNORMAL HIGH (ref 0–99)
Triglycerides: 467 mg/dL — ABNORMAL HIGH (ref 0–149)
VLDL Cholesterol Cal: 85 mg/dL — ABNORMAL HIGH (ref 5–40)

## 2022-08-13 ENCOUNTER — Telehealth: Payer: Self-pay | Admitting: Family Medicine

## 2022-08-13 ENCOUNTER — Ambulatory Visit: Payer: 59

## 2022-08-13 DIAGNOSIS — R072 Precordial pain: Secondary | ICD-10-CM

## 2022-08-13 NOTE — Telephone Encounter (Signed)
Pt would like her referral for gastro sent to Dr Jennye Boroughs in [place]. Fax# [phone].      Yolanda she would also like to know if the blood work she had at her heart dr is good for Dr Janee Morn? Please call

## 2022-08-14 NOTE — Telephone Encounter (Signed)
Spoke with pt and advised her of annotation below. She stated that she heard back from GI, but they are requesting her most recent labs which are from cardiology. She asked if we could fax them at [phone]. I advised her that I would send the fax.

## 2022-08-14 NOTE — Telephone Encounter (Signed)
Yes referral has been sent

## 2022-08-16 ENCOUNTER — Ambulatory Visit (HOSPITAL_COMMUNITY)
Admission: RE | Admit: 2022-08-16 | Discharge: 2022-08-16 | Disposition: A | Discharge status: Home / Self Care | Payer: 59 | Source: Ambulatory Visit | Attending: Cardiology | Admitting: Cardiology

## 2022-08-16 DIAGNOSIS — R072 Precordial pain: Secondary | ICD-10-CM

## 2022-08-16 NOTE — Addendum Note (Signed)
Addended by: Elder Negus on: 08/16/2022 02:11 PM   Modules accepted: Orders

## 2022-09-12 ENCOUNTER — Emergency Department (HOSPITAL_COMMUNITY)
Admission: EM | Admit: 2022-09-12 | Discharge: 2022-09-13 | Disposition: A | Discharge status: Home / Self Care | Payer: 59 | Attending: Student | Admitting: Student

## 2022-09-12 ENCOUNTER — Other Ambulatory Visit: Payer: Self-pay

## 2022-09-12 DIAGNOSIS — R1032 Left lower quadrant pain: Secondary | ICD-10-CM | POA: Insufficient documentation

## 2022-09-12 DIAGNOSIS — R5383 Other fatigue: Secondary | ICD-10-CM | POA: Diagnosis not present

## 2022-09-12 DIAGNOSIS — R197 Diarrhea, unspecified: Secondary | ICD-10-CM | POA: Insufficient documentation

## 2022-09-12 DIAGNOSIS — R000 Tachycardia, unspecified: Secondary | ICD-10-CM | POA: Insufficient documentation

## 2022-09-12 DIAGNOSIS — I10 Essential (primary) hypertension: Secondary | ICD-10-CM | POA: Insufficient documentation

## 2022-09-12 DIAGNOSIS — R112 Nausea with vomiting, unspecified: Secondary | ICD-10-CM | POA: Insufficient documentation

## 2022-09-12 DIAGNOSIS — Z8616 Personal history of COVID-19: Secondary | ICD-10-CM | POA: Diagnosis not present

## 2022-09-12 LAB — CBC WITH DIFFERENTIAL/PLATELET
Abs Immature Granulocytes: 0.07 10*3/uL (ref 0.00–0.07)
Basophils Absolute: 0.1 10*3/uL (ref 0.0–0.1)
Basophils Relative: 1 %
Eosinophils Absolute: 0.1 10*3/uL (ref 0.0–0.5)
Eosinophils Relative: 1 %
HCT: 37.3 % (ref 36.0–46.0)
Hemoglobin: 11.9 g/dL — ABNORMAL LOW (ref 12.0–15.0)
Immature Granulocytes: 1 %
Lymphocytes Relative: 18 %
Lymphs Abs: 1.9 10*3/uL (ref 0.7–4.0)
MCH: 26.7 pg (ref 26.0–34.0)
MCHC: 31.9 g/dL (ref 30.0–36.0)
MCV: 83.6 fL (ref 80.0–100.0)
Monocytes Absolute: 0.4 10*3/uL (ref 0.1–1.0)
Monocytes Relative: 3 %
Neutro Abs: 8.1 10*3/uL — ABNORMAL HIGH (ref 1.7–7.7)
Neutrophils Relative %: 76 %
Platelets: 263 10*3/uL (ref 150–400)
RBC: 4.46 MIL/uL (ref 3.87–5.11)
RDW: 13.6 % (ref 11.5–15.5)
WBC: 10.5 10*3/uL (ref 4.0–10.5)
nRBC: 0 % (ref 0.0–0.2)

## 2022-09-12 MED ORDER — PROCHLORPERAZINE EDISYLATE 10 MG/2ML IJ SOLN
10.0000 mg | Freq: Once | INTRAMUSCULAR | Status: AC
  Administered 2022-09-12: 10 mg via INTRAVENOUS
  Filled 2022-09-12: qty 2

## 2022-09-12 MED ORDER — LACTATED RINGERS IV BOLUS
1000.0000 mL | Freq: Once | INTRAVENOUS | Status: AC
  Administered 2022-09-12: 1000 mL via INTRAVENOUS

## 2022-09-12 MED ORDER — DIPHENHYDRAMINE HCL 50 MG/ML IJ SOLN
25.0000 mg | Freq: Once | INTRAMUSCULAR | Status: AC
  Administered 2022-09-12: 25 mg via INTRAVENOUS
  Filled 2022-09-12: qty 1

## 2022-09-12 NOTE — ED Triage Notes (Signed)
[facility] from home pt took prep for colonoscopy 4pm today and started having n/v/d and called ems. Pt stated she passed out before ems arrival pt was a&o with ems. Bp- 122/82 85 p 90% ra  2l 96% 126 cbg  20rac 4mg  zofran with ems and not effective

## 2022-09-12 NOTE — ED Provider Notes (Signed)
[place] EMERGENCY DEPARTMENT AT [facility] Provider Note  CSN: [id number] Arrival date & time: 09/12/22 2255  Chief Complaint(s) Emesis  HPI Judith Bentley is a 47 y.o. female with PMH GERD, HTN, HLD, OSA, chronic left lower quadrant abdominal pain who presents emergency room for evaluation of emesis, diarrhea, abdominal pain and fatigue.  Patient is scheduled for colonoscopy tomorrow and has been taking the colon prep.  She has had multiple episodes of diarrhea nausea and vomiting and is currently unable to tolerate p.o.  States that she feels very weak and lightheaded.  Endorses persistent left lower quadrant pain.  Denies chest pain, shortness of breath, headache, fever or other systemic symptoms.   Past Medical History Past Medical History:  Diagnosis Date   Allergy    Seasonal allergies   Anxious appearance 06/15/2022   COVID-19    Cyclical vomiting syndrome    GERD (gastroesophageal reflux disease)    Hyperlipidemia 8/22?   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 Authorizing Provider  furosemide (LASIX) 20 MG tablet Take 1 tablet (20 mg total) by mouth daily. 06/11/22 10/09/22  Elder Negus, MD  Multiple Vitamin (MULTIVITAMIN PO) Take by mouth.     [provider]  omeprazole (PRILOSEC) 20 MG capsule Take 20 mg by mouth daily.    [provider]  promethazine (PHENERGAN) 25 MG tablet Take 1 tablet (25 mg total) by mouth every 8 (eight) hours as needed for nausea or vomiting. 09/13/22   Aili Casillas, Wyn Forster, MD                                                                                                                                    Past Surgical History Past Surgical History:  Procedure Laterality Date   CESAREAN SECTION     CHOLECYSTECTOMY     TUBAL LIGATION  03/08/05   Family History Family History  Problem Relation Age of Onset   High Cholesterol Mother    COPD Mother    Depression Mother    Hyperlipidemia Mother    Hypertension Mother    Sleep apnea Mother    Pancreatic cancer Father    Diabetes  Mellitus II Maternal Grandfather     Social History Social History   Tobacco Use   Smoking status: Never   Smokeless tobacco: Never   Tobacco comments:    Never smoked  Vaping Use   Vaping status: Never Used  Substance Use Topics   Alcohol use: Never   Drug use: Never   Allergies Codeine  Review of Systems Review of Systems  Gastrointestinal:  Positive for abdominal pain, diarrhea, nausea and vomiting.    Physical Exam Vital Signs  I have reviewed the triage vital signs BP 137/84 (BP Location: Right Arm)   Pulse 87   Temp 98 F (36.7 C) (Oral)   Resp 18   SpO2 100%   Physical Exam Vitals and nursing note reviewed.  Constitutional:      General: She is not in acute distress.    Appearance: She is well-developed. She is ill-appearing.  HENT:     Head: Normocephalic and atraumatic.  Eyes:     Conjunctiva/sclera: Conjunctivae normal.  Cardiovascular:     Rate and Rhythm: Regular rhythm. Tachycardia present.     Heart sounds: No murmur heard. Pulmonary:     Effort: Pulmonary effort is normal. No respiratory distress.     Breath sounds: Normal breath sounds.  Abdominal:      Palpations: Abdomen is soft.     Tenderness: There is abdominal tenderness.  Musculoskeletal:        General: No swelling.     Cervical back: Neck supple.  Skin:    General: Skin is warm and dry.     Capillary Refill: Capillary refill takes less than 2 seconds.  Neurological:     Mental Status: She is alert.  Psychiatric:        Mood and Affect: Mood normal.     ED Results and Treatments Labs (all labs ordered are listed, but only abnormal results are displayed) Labs Reviewed  CBC WITH DIFFERENTIAL/PLATELET - Abnormal; Notable for the following components:      Result Value   Hemoglobin 11.9 (*)    Neutro Abs 8.1 (*)    All other components within normal limits  COMPREHENSIVE METABOLIC PANEL - Abnormal; Notable for the following components:   Glucose, Bld 131 (*)    All other components within normal limits  URINALYSIS, ROUTINE W REFLEX MICROSCOPIC - Abnormal; Notable for the following components:   Color, Urine STRAW (*)    All other components within normal limits  LIPASE, BLOOD                                                                                                                          Radiology No results found.  Pertinent labs & imaging results that were available during my care of the patient were reviewed by me and considered in my medical decision making (see MDM for details).  Medications Ordered in ED Medications  lactated ringers bolus 1,000 mL (0 mLs Intravenous Stopped 09/13/22 0114)  prochlorperazine (COMPAZINE)  injection 10 mg (10 mg Intravenous Given 09/12/22 2351)  diphenhydrAMINE (BENADRYL) injection 25 mg (25 mg Intravenous Given 09/12/22 2353)  lactated ringers bolus 1,000 mL (0 mLs Intravenous Stopped 09/13/22 0230)                                                                                                                                     Procedures Procedures  (including critical care time)  Medical Decision Making / ED  Course   This patient presents to the ED for concern of nausea, vomiting, diarrhea, this involves an extensive number of treatment options, and is a complaint that carries with it a high risk of complications and morbidity.  The differential diagnosis includes effects of colon prep for colonoscopy, obstruction, intra-abdominal infection, gastroenteritis, cyclic vomiting  MDM: Patient seen emergency room for evaluation of nausea, vomiting, diarrhea.  Physical exam reveals an ill-appearing patient with dry tacky mucous membranes but abdominal exam is benign.  Laboratory evaluation largely unremarkable aside of a mild anemia to 11.9.  CT of the pelvis was ordered but patient declined.  She was given 2 L lactated Ringer's and Compazine Benadryl and a reevaluation her symptoms have significant proved.  She is able to tolerate p.o. without difficulty and she had clear liquids only in order to not interfere with her colon prep.  As patient is appropriately fluid resuscitated, there is no real reason why she could not have her colonoscopy done tomorrow and she likely is the most optimized she is ever going to be for this procedure after her copious diarrhea and vomiting.  I encouraged the family to call her gastroenterologist tomorrow to see if she can still have this procedure done.  I refilled the patient's Phenergan and at this time she does not meet inpatient criteria for admission.  She was then discharged with outpatient gastroenterology follow-up.   Additional history obtained: -Additional history obtained from husband -External records from outside source obtained and reviewed including: Chart review including previous notes, labs, imaging, consultation notes   Lab Tests: -I ordered, reviewed, and interpreted labs.   The pertinent results include:   Labs Reviewed  CBC WITH DIFFERENTIAL/PLATELET - Abnormal; Notable for the following components:      Result Value   Hemoglobin 11.9 (*)    Neutro  Abs 8.1 (*)    All other components within normal limits  COMPREHENSIVE METABOLIC PANEL - Abnormal; Notable for the following components:   Glucose, Bld 131 (*)    All other components within normal limits  URINALYSIS, ROUTINE W REFLEX MICROSCOPIC - Abnormal; Notable for the following components:   Color, Urine STRAW (*)    All other components within normal limits  LIPASE, BLOOD       Medicines ordered and prescription drug management: Meds ordered this encounter  Medications   lactated ringers bolus 1,000 mL   prochlorperazine (COMPAZINE) injection 10 mg   diphenhydrAMINE (BENADRYL) injection  25 mg   lactated ringers bolus 1,000 mL   promethazine (PHENERGAN) 25 MG tablet    Sig: Take 1 tablet (25 mg total) by mouth every 8 (eight) hours as needed for nausea or vomiting.    Dispense:  10 tablet    Refill:  0    -I have reviewed the patients home medicines and have made adjustments as needed  Critical interventions none    Cardiac Monitoring: The patient was maintained on a cardiac monitor.  I personally viewed and interpreted the cardiac monitored which showed an underlying rhythm of: NSR  Social Determinants of Health:  Factors impacting patients care include: none   Reevaluation: After the interventions noted above, I reevaluated the patient and found that they have :improved  Co morbidities that complicate the patient evaluation  Past Medical History:  Diagnosis Date   Allergy    Seasonal allergies   Anxious appearance 06/15/2022   COVID-19    Cyclical vomiting syndrome    GERD (gastroesophageal reflux disease)    Hyperlipidemia 8/22?   Hypertension    Sleep apnea    I have not been diagnosed, but have symptoms      Dispostion: I considered admission for this patient, but at this time she does not meet inpatient criteria for admission and she is safe for discharge outpatient follow-up     Final Clinical Impression(s) / ED Diagnoses Final diagnoses:   Nausea vomiting and diarrhea     @PCDICTATION @    Hassen Bruun, Wyn Forster, MD 09/13/22 0301

## 2022-09-13 ENCOUNTER — Emergency Department (HOSPITAL_COMMUNITY): Payer: 59

## 2022-09-13 LAB — URINALYSIS, ROUTINE W REFLEX MICROSCOPIC
Glucose, UA: NEGATIVE mg/dL
Ketones, ur: NEGATIVE mg/dL
Leukocytes,Ua: NEGATIVE
Nitrite: NEGATIVE
Protein, ur: NEGATIVE mg/dL
Specific Gravity, Urine: 1.005 (ref 1.005–1.030)
pH: 5 (ref 5.0–8.0)

## 2022-09-13 LAB — COMPREHENSIVE METABOLIC PANEL
ALT: 35 U/L (ref 0–44)
Albumin: 3.9 g/dL (ref 3.5–5.0)
Alkaline Phosphatase: 89 U/L (ref 38–126)
Anion gap: 11 (ref 5–15)
BUN: 9 mg/dL (ref 6–20)
Calcium: 9 mg/dL (ref 8.9–10.3)
Chloride: 105 mmol/L (ref 98–111)
Creatinine, Ser: 0.71 mg/dL (ref 0.44–1.00)
GFR, Estimated: >60 mL/min (ref >60)
Glucose, Bld: 131 mg/dL — ABNORMAL HIGH (ref 70–99)
Potassium: 3.5 mmol/L (ref 3.5–5.1)
Total Bilirubin: 0.4 mg/dL (ref 0.3–1.2)
Total Protein: 7.2 g/dL (ref 6.5–8.1)

## 2022-09-13 LAB — LIPASE, BLOOD: Lipase: 46 U/L (ref 11–51)

## 2022-09-13 LAB — HM COLONOSCOPY

## 2022-09-13 MED ORDER — PROMETHAZINE HCL 25 MG PO TABS: 25.0000 mg | ORAL_TABLET | Freq: Three times a day (TID) | ORAL | 0 refills | Status: DC | PRN

## 2022-09-13 MED ORDER — LACTATED RINGERS IV BOLUS
1000.0000 mL | Freq: Once | INTRAVENOUS | Status: AC
  Administered 2022-09-13: 1000 mL via INTRAVENOUS

## 2022-09-13 NOTE — ED Notes (Signed)
Pt stated she did not want to get a CT, advised MD Kommor

## 2022-09-13 NOTE — ED Notes (Signed)
Pt ambulated to the bathroom.  

## 2022-09-13 NOTE — ED Notes (Signed)
Pt tolerated po fluids with no issues.

## 2022-09-17 ENCOUNTER — Ambulatory Visit (INDEPENDENT_AMBULATORY_CARE_PROVIDER_SITE_OTHER): Payer: 59 | Admitting: Family Medicine

## 2022-09-17 ENCOUNTER — Encounter: Payer: Self-pay | Admitting: Family Medicine

## 2022-09-17 VITALS — BP 128/82 | HR 85 | Temp 97.7°F | Ht 69.0 in | Wt 283.2 lb

## 2022-09-17 DIAGNOSIS — Z0000 Encounter for general adult medical examination without abnormal findings: Secondary | ICD-10-CM

## 2022-09-17 DIAGNOSIS — Z5181 Encounter for therapeutic drug level monitoring: Secondary | ICD-10-CM | POA: Insufficient documentation

## 2022-09-17 DIAGNOSIS — G8929 Other chronic pain: Secondary | ICD-10-CM

## 2022-09-17 DIAGNOSIS — Z124 Encounter for screening for malignant neoplasm of cervix: Secondary | ICD-10-CM

## 2022-09-17 DIAGNOSIS — Z23 Encounter for immunization: Secondary | ICD-10-CM

## 2022-09-17 DIAGNOSIS — R1032 Left lower quadrant pain: Secondary | ICD-10-CM

## 2022-09-17 DIAGNOSIS — G4733 Obstructive sleep apnea (adult) (pediatric): Secondary | ICD-10-CM | POA: Insufficient documentation

## 2022-09-17 DIAGNOSIS — Z6841 Body Mass Index (BMI) 40.0 and over, adult: Secondary | ICD-10-CM | POA: Diagnosis not present

## 2022-09-17 DIAGNOSIS — R1115 Cyclical vomiting syndrome unrelated to migraine: Secondary | ICD-10-CM | POA: Diagnosis not present

## 2022-09-17 DIAGNOSIS — F4321 Adjustment disorder with depressed mood: Secondary | ICD-10-CM | POA: Insufficient documentation

## 2022-09-17 DIAGNOSIS — R079 Chest pain, unspecified: Secondary | ICD-10-CM

## 2022-09-17 HISTORY — DX: Encounter for therapeutic drug level monitoring: Z51.81

## 2022-09-17 MED ORDER — PHENTERMINE-TOPIRAMATE 3.75-23 MG PO CP24
ORAL_CAPSULE | ORAL | 0 refills | Status: DC
Start: 2022-09-17 — End: 2022-09-21

## 2022-09-17 NOTE — Assessment & Plan Note (Signed)
Await results from recent colonoscopy. Referral to gastroenterology for further evaluation.

## 2022-09-17 NOTE — Patient Instructions (Addendum)
Start phentermine/topiramate as prescribed.  We have ordered mammogram. Office to call and schedule.  We are referring to bariatric medicine, ob/gyn, and dietician as discussed.  Please follow up with gastroenterology, sleep medicine, and cardiology.

## 2022-09-17 NOTE — Assessment & Plan Note (Signed)
Continue to monitor and adjust CPAP usage as needed. The patient is exploring other options to improve sleep quality.

## 2022-09-17 NOTE — Progress Notes (Signed)
Assessment/Plan:   Problem List Items Addressed This Visit       Respiratory   Obstructive sleep apnea syndrome    Continue to monitor and adjust CPAP usage as needed. The patient is exploring other options to improve sleep quality.      Relevant Medications   Phentermine-Topiramate 3.75-23 MG CP24     Digestive   Cyclical vomiting syndrome     Other   Chest pain    Continue cardiology follow-up. Normal results from recent cardiac workup.      Abdominal pain, chronic, left lower quadrant    Await results from recent colonoscopy. Referral to gastroenterology for further evaluation.      Class 3 severe obesity with serious comorbidity and body mass index (BMI) of 40.0 to 44.9 in adult [facility]) - Primary    Discussed potential medications (Phentermine/Topiramate) for weight loss and mood improvement. Referral to bariatrics for further evaluation and potential surgical options. - Dietitian and counseling for lifestyle changes.      Relevant Medications   Phentermine-Topiramate 3.75-23 MG CP24   Other Relevant Orders   Amb ref to Medical Nutrition Therapy-MNT   Amb Referral to Bariatric Surgery   Adjustment disorder with depressed mood   Relevant Orders   ToxAssure Flex 23, Ur   Encounter for therapeutic drug monitoring   Other Visit Diagnoses     Screening for cervical cancer       Relevant Orders   Ambulatory referral to Obstetrics / Gynecology   Encounter for well adult exam without abnormal findings       Relevant Orders   MM 3D SCREENING MAMMOGRAM BILATERAL BREAST   Immunization due           There are no discontinued medications.  Return in about 4 weeks (around 10/15/2022) for Weight management.    Subjective:   Encounter date: 09/17/2022  Judith Bentley is a 47 y.o. female who has Syncope; Anemia; Pneumonia due to COVID-19 virus; Class 2 obesity due to excess calories with body mass index (BMI) of 38.0 to 38.9 in adult; Cyclical vomiting syndrome;  Exertional dyspnea; Cough; Postviral fatigue syndrome; Chest pain; Nausea; Apnea; PVC (premature ventricular contraction); Abdominal pain, chronic, left lower quadrant; Anxious appearance; Class 3 severe obesity with serious comorbidity and body mass index (BMI) of 40.0 to 44.9 in adult [facility]); Obstructive sleep apnea syndrome; Adjustment disorder with depressed mood; and Encounter for therapeutic drug monitoring on their problem list..   She  has a past medical history of Allergy, Anxious appearance (06/15/2022), COVID-19, Cyclical vomiting syndrome, Encounter for therapeutic drug monitoring (09/17/2022), GERD (gastroesophageal reflux disease), Hyperlipidemia (8/22?), Hypertension, and Sleep apnea..   She presents with chief complaint of Annual Exam (Fasting.  Had colonoscopy completed last Thursday requesting records. Ok with ai) .   HISTORY OF PRESENT ILLNESS: Sleep Apnea: Patient reports difficulty sleeping due to issues with her CPAP machine. She describes the mask seal breaking due to moisture, disturbing her sleep and causing frequent awakenings.  Cardiac Evaluation: Patient has had a full heart workup including echocardiogram, blood work, stress test, and coronary artery calcium scoring, all of which were normal.  Syncope: Patient has a history of syncope and has been seeing multiple specialists including pulmonology, cardiology, and gastroenterology. No recent syncopal episodes were reported.  Abdominal Pain: Patient describes a persistent discomfort in the left lower quadrant of the abdomen. Recent colonoscopy results are pending.     09/17/2022    9:14 AM 06/15/2022   10:21 AM 12/07/2019  9:54 AM  Depression screen PHQ 2/9  Decreased Interest 3 0 0  Down, Depressed, Hopeless 1 0 0  PHQ - 2 Score 4 0 0  Altered sleeping 2 3   Tired, decreased energy 3 3   Change in appetite 0 0   Feeling bad or failure about yourself  0 0   Trouble concentrating 1 1   Moving slowly or  fidgety/restless 1 0   Suicidal thoughts 0 0   PHQ-9 Score 11 7   Difficult doing work/chores Extremely dIfficult Somewhat difficult     Review of Systems  Constitutional:  Positive for malaise/fatigue.  Respiratory:  Positive for shortness of breath.   Cardiovascular:  Positive for chest pain (No chest pain or palpitations at the visit today) and palpitations.  Gastrointestinal:  Positive for abdominal pain (Left lower quadrant), nausea and vomiting.  Musculoskeletal:  Positive for back pain and joint pain.  Psychiatric/Behavioral:  Positive for depression. Negative for suicidal ideas. The patient is nervous/anxious and has insomnia.   All other systems reviewed and are negative.   Past Surgical History:  Procedure Laterality Date   CESAREAN SECTION     CHOLECYSTECTOMY     TUBAL LIGATION  03/08/05    Outpatient Medications Prior to Visit  Medication Sig Dispense Refill   Multiple Vitamin (MULTIVITAMIN PO) Take by mouth.     omeprazole (PRILOSEC) 20 MG capsule Take 20 mg by mouth daily.     promethazine (PHENERGAN) 25 MG tablet Take 1 tablet (25 mg total) by mouth every 8 (eight) hours as needed for nausea or vomiting. 10 tablet 0   furosemide (LASIX) 20 MG tablet Take 1 tablet (20 mg total) by mouth daily. (Patient not taking: Reported on 09/17/2022) 30 tablet 3   No facility-administered medications prior to visit.    Family History  Problem Relation Age of Onset   High Cholesterol Mother    COPD Mother    Depression Mother    Hyperlipidemia Mother    Hypertension Mother    Sleep apnea Mother    ADD / ADHD Mother    Anxiety disorder Mother    Obesity Mother    Pancreatic cancer Father    Cancer Father    Diabetes Mellitus II Maternal Grandfather     Social History   Socioeconomic History   Marital status: Married    Spouse name: Not on file   Number of children: 6   Years of education: Not on file   Highest education level: Not on file  Occupational History    Occupation: housewife  Tobacco Use   Smoking status: Never   Smokeless tobacco: Never   Tobacco comments:    Never smoked  Vaping Use   Vaping status: Never Used  Substance and Sexual Activity   Alcohol use: Never   Drug use: Never   Sexual activity: Yes    Birth control/protection: Other-see comments    Comment: Tubes tied and suspected menopause  Other Topics Concern   Not on file  Social History Narrative   Not on file   Social Determinants of Health   Financial Resource Strain: Not on file  Food Insecurity: Not on file  Transportation Needs: Not on file  Physical Activity: Not on file  Stress: Not on file  Social Connections: Not on file  Intimate Partner Violence: Not on file  Objective:  Physical Exam: BP 128/82 (BP Location: Left Arm, Patient Position: Sitting, Cuff Size: Large)   Pulse 85   Temp 97.7 F (36.5 C) (Temporal)   Ht 5\' 9"  (1.753 m)   Wt 283 lb 3.2 oz (128.5 kg)   SpO2 97%   BMI 41.82 kg/m     Physical Exam Constitutional:      General: She is not in acute distress.    Appearance: Normal appearance. She is not ill-appearing or toxic-appearing.  HENT:     Head: Normocephalic and atraumatic.     Nose: Nose normal. No congestion.  Eyes:     General: No scleral icterus.    Extraocular Movements: Extraocular movements intact.  Cardiovascular:     Rate and Rhythm: Normal rate and regular rhythm.     Pulses: Normal pulses.     Heart sounds: Normal heart sounds.  Pulmonary:     Effort: Pulmonary effort is normal. No respiratory distress.     Breath sounds: Normal breath sounds.  Abdominal:     General: Abdomen is flat. Bowel sounds are normal.     Palpations: Abdomen is soft.  Musculoskeletal:        General: Normal range of motion.  Lymphadenopathy:     Cervical: No cervical adenopathy.  Skin:    General: Skin is warm and dry.      Findings: No rash.  Neurological:     General: No focal deficit present.     Mental Status: She is alert and oriented to person, place, and time. Mental status is at baseline.  Psychiatric:        Mood and Affect: Mood is anxious and depressed. Affect is tearful.        Speech: She is communicative. Speech is not delayed.        Behavior: Behavior is cooperative.        Thought Content: Thought content does not include suicidal ideation. Thought content does not include suicidal plan.        Judgment: Judgment is not impulsive.     CT CARDIAC SCORING (SELF PAY ONLY)  Addendum Date: 08/22/2022   ADDENDUM REPORT: 08/22/2022 03:18 EXAM: OVER-READ INTERPRETATION  CT CHEST The following report is an over-read performed by radiologist Dr. Alcide Clever of [facility] on 08/22/2022. This over-read does not include interpretation of cardiac or coronary anatomy or pathology. The coronary calcium score interpretation by the cardiologist is attached. COMPARISON:  09/07/2019 FINDINGS: Cardiovascular: There are no significant extracardiac vascular findings. Mediastinum/Nodes: There are no enlarged lymph nodes within the visualized mediastinum. Lungs/Pleura: There is no pleural effusion. The visualized lungs appear clear. Upper abdomen: No significant findings in the visualized upper abdomen. Musculoskeletal/Chest wall: No chest wall mass or suspicious osseous findings within the visualized chest. IMPRESSION: No significant extracardiac findings within the visualized chest. Electronically Signed   By: Alcide Clever M.D.   On: 08/22/2022 03:18   Result Date: 08/22/2022 CLINICAL DATA:  Cardiovascular Disease Risk stratification EXAM: Coronary Calcium Score TECHNIQUE: A gated, non-contrast computed tomography scan of the heart was performed using 3mm slice thickness. Axial images were analyzed on a dedicated workstation. Calcium scoring of the coronary arteries was performed using the Agatston method.  FINDINGS: Coronary Calcium Score: Left main: 0 Left anterior descending artery: 0 Left circumflex artery: 0 Right coronary artery: 0 Total: 0 Percentile: 0 Pericardium: Normal. Ascending Aorta: Normal caliber. Non-cardiac: See separate report from [facility]. IMPRESSION: Coronary calcium score of 0. This was 0 percentile  for age-, race-, and sex-matched controls. RECOMMENDATIONS: Coronary artery calcium (CAC) score is a strong predictor of incident coronary heart disease (CHD) and provides predictive information beyond traditional risk factors. CAC scoring is reasonable to use in the decision to withhold, postpone, or initiate statin therapy in intermediate-risk or selected borderline-risk asymptomatic adults (age 60-75 years and LDL-C >=70 to <190 mg/dL) who do not have diabetes or established atherosclerotic cardiovascular disease (ASCVD).* In intermediate-risk (10-year ASCVD risk >=7.5% to <20%) adults or selected borderline-risk (10-year ASCVD risk >=5% to <7.5%) adults in whom a CAC score is measured for the purpose of making a treatment decision the following recommendations have been made: If CAC=0, it is reasonable to withhold statin therapy and reassess in 5 to 10 years, as long as higher risk conditions are absent (diabetes mellitus, family history of premature CHD in first degree relatives (males <55 years; females <65 years), cigarette smoking, or LDL >=190 mg/dL). If CAC is 1 to 99, it is reasonable to initiate statin therapy for patients >=103 years of age. If CAC is >=100 or >=75th percentile, it is reasonable to initiate statin therapy at any age. Cardiology referral should be considered for patients with CAC scores >=400 or >=75th percentile. *2018 AHA/ACC/AACVPR/AAPA/ABC/ACPM/ADA/AGS/APhA/ASPC/NLA/PCNA Guideline on the Management of Blood Cholesterol: A Report of the American College of Cardiology/American Heart Association Task Force on Clinical Practice Guidelines. J Am Coll Cardiol.  2019;73(24):3168-3209. Olga Millers, MD Electronically Signed: By: Olga Millers M.D. On: 08/16/2022 11:03   PCV MYOCARDIAL PERFUSION WO LEXISCAN  Result Date: 08/16/2022 Exercise nuclear stress test 08/13/2022: Myocardial perfusion is normal. Overall LV systolic function is normal without regional wall motion abnormalities. Stress LV EF: 68%. Normal ECG stress. Patient exercised on Bruce protocol with grade adjustment to max of 25 degrees at patient request, exercised for 6:42 min and achieved 14.91 METS & 88% MPHR. Stress symptoms included fatigue. Resting BP 134/70 mm Hg, peak BP  180/90 mm Hg. BP response Normal. Exercise capacity was excellent. No previous exam available for comparison. Low risk.   Home sleep test  Result Date: 07/31/2022 Huston Foley, MD     08/07/2022  5:34 PM  [facility] HOME SLEEP TEST (Watch PAT) REPORT  -  Mail-out Device STUDY DATE: 08/03/22 DOB: 06-Nov-1975 MRN: [id number] ORDERING CLINICIAN: Huston Foley, MD, PhD  REFERRING CLINICIAN: Dr. Truett Mainland CLINICAL INFORMATION/HISTORY: 46 year old female with an underlying medical history of palpitations with PVCs, hyperlipidemia, hypertension, allergies, reflux disease, cyclical vomiting syndrome, history of syncope, dyspnea on exertion, leg swelling, and severe obesity with a BMI of over 40, who reports snoring and excessive daytime somnolence as well as witnessed apneas. Epworth sleepiness score: 11/24. BMI: 41.5 kg/m FINDINGS: Sleep Summary: Total Recording Time (hours, min): 9 hours, 59 min Total Sleep Time (hours, min):  9 hours, 31 min Percent REM (%):    Inconclusive REM detection Respiratory Indices: Calculated pAHI (per hour):  48.1/hour       REM pAHI:    N/A     NREM pAHI: 48.1/hour Central pAHI: 1.3/hour Oxygen Saturation Statistics:  Oxygen Saturation (%) Mean: 93% Minimum oxygen saturation (%):                 76% O2 Saturation Range (%): 76 - 98%  O2 Saturation (minutes) <=88%: 17.6 min Pulse  Rate Statistics: Pulse Mean (bpm):    70/min  Pulse Range (48 - 106/min) IMPRESSION: OSA (obstructive sleep apnea), severe Nocturnal Hypoxemia RECOMMENDATION: This home sleep test demonstrates severe obstructive sleep apnea  with a total AHI of 48.1/hour and O2 nadir of 76% with time below or at 88% saturation of over 15 minutes, indicating nocturnal hypoxemia.  Snoring was detected, in the moderate to loud range, rarely milder.  Treatment with positive airway pressure is highly recommended. The patient will be advised to proceed with an autoPAP titration/trial at home. A laboratory attended titration study can be considered in the future for optimization of treatment settings and to improve tolerance and compliance. Alternative treatment options are limited secondary to the severity of the patient's sleep disordered breathing, but may include surgical treatment with an implantable hypoglossal nerve stimulator (in carefully selected candidates, meeting criteria).  Concomitant weight loss is recommended, where clinically appropriate. Please note, that untreated obstructive sleep apnea may carry additional perioperative morbidity. Patients with significant obstructive sleep apnea should receive perioperative PAP therapy and the surgeons and particularly the anesthesiologist should be informed of the diagnosis and the severity of the sleep disordered breathing. The patient should be cautioned not to drive, work at heights, or operate dangerous or heavy equipment when tired or sleepy. Review and reiteration of good sleep hygiene measures should be pursued with any patient. While waiting for autoPAP set up at home, I recommend patient sleep with HOB mildly elevated between 30-45 degrees and primarily sleep on her sides. Other causes of the patient's symptoms, including circadian rhythm disturbances, an underlying mood disorder, medication effect and/or an underlying medical problem cannot be ruled out based on this test.  Clinical correlation is recommended. The patient and her referring provider will be notified of the test results. The patient will be seen in follow up in sleep clinic at [facility]. I certify that I have reviewed the raw data recording prior to the issuance of this report in accordance with the standards of the American Academy of Sleep Medicine (AASM). INTERPRETING PHYSICIAN: Huston Foley, MD, PhD Medical Director, [facility]) Diplomat, ABPN (Neurology and Sleep) [facility] [address] [place], [place] [address] [phone]   PCV ECHOCARDIOGRAM COMPLETE  Result Date: 07/04/2022 Echocardiogram 07/03/2022: Normal LV systolic function with visual EF 55-60%. Left ventricle cavity is normal in size. Mild concentric hypertrophy of the left ventricle. Normal global wall motion. Normal diastolic filling pattern, normal LAP. Calculated EF 55%. Left atrial cavity is slightly dilated. Structurally normal tricuspid valve with trace regurgitation. No evidence of pulmonary hypertension. The aortic root is mildly dilated at 3.5 cm. Mildly dilated ascending aorta at 3.7 cm. No prior available for comparison.    Recent Results (from the past 2160 hour(s))  CBC     Status: None   Collection Time: 07/18/22  1:36 PM  Result Value Ref Range   WBC 6.4 3.4 - 10.8 x10E3/uL   RBC 4.57 3.77 - 5.28 x10E6/uL   Hemoglobin 12.3 11.1 - 15.9 g/dL   Hematocrit 41.3 24.4 - 46.6 %   MCV 83 79 - 97 fL   MCH 26.9 26.6 - 33.0 pg   MCHC 32.5 31.5 - 35.7 g/dL   RDW 01.0 27.2 - 53.6 %   Platelets 308 150 - 450 x10E3/uL  Basic metabolic panel     Status: Abnormal   Collection Time: 07/18/22  1:36 PM  Result Value Ref Range   Glucose 100 (H) 70 - 99 mg/dL   BUN 15 6 - 24 mg/dL   Creatinine, Ser 6.44 0.57 - 1.00 mg/dL   eGFR 034 >74 QV/ZDG/3.87   BUN/Creatinine Ratio 22 9 - 23  Sodium 141 134 - 144 mmol/L   Potassium 4.2 3.5 - 5.2 mmol/L   Chloride 102 96 - 106  mmol/L   CO2 26 20 - 29 mmol/L   Calcium 9.4 8.7 - 10.2 mg/dL  Pro b natriuretic peptide (BNP)[id number])     Status: None   Collection Time: 07/18/22  1:36 PM  Result Value Ref Range   NT-Pro BNP 41 0 - 249 pg/mL    Comment: The following cut-points have been suggested for the use of proBNP for the diagnostic evaluation of heart failure (HF) in patients with acute dyspnea: Modality                     Age           Optimal Cut                            (years)            Point ------------------------------------------------------ Diagnosis (rule in HF)        <50            450 pg/mL                           50 - 75            900 pg/mL                               >75           1800 pg/mL Exclusion (rule out HF)  Age independent     300 pg/mL   TSH     Status: None   Collection Time: 07/18/22  1:36 PM  Result Value Ref Range   TSH 3.330 0.450 - 4.500 uIU/mL  Lipid panel     Status: Abnormal   Collection Time: 08/10/22  1:37 PM  Result Value Ref Range   Cholesterol, Total 272 (H) 100 - 199 mg/dL   Triglycerides 782 (H) 0 - 149 mg/dL   HDL 54 >95 mg/dL   VLDL Cholesterol Cal 85 (H) 5 - 40 mg/dL   LDL Chol Calc (NIH) 621 (H) 0 - 99 mg/dL   Chol/HDL Ratio 5.0 (H) 0.0 - 4.4 ratio    Comment:                                   T. Chol/HDL Ratio                                             Men  Women                               1/2 Avg.Risk  3.4    3.3                                   Avg.Risk  5.0    4.4  2X Avg.Risk  9.6    7.1                                3X Avg.Risk 23.4   11.0   CBC with Differential     Status: Abnormal   Collection Time: 09/12/22 11:14 PM  Result Value Ref Range   WBC 10.5 4.0 - 10.5 K/uL   RBC 4.46 3.87 - 5.11 MIL/uL   Hemoglobin 11.9 (L) 12.0 - 15.0 g/dL   HCT 32.9 51.8 - 84.1 %   MCV 83.6 80.0 - 100.0 fL   MCH 26.7 26.0 - 34.0 pg   MCHC 31.9 30.0 - 36.0 g/dL   RDW 66.0 63.0 - 16.0 %    Platelets 263 150 - 400 K/uL   nRBC 0.0 0.0 - 0.2 %   Neutrophils Relative % 76 %   Neutro Abs 8.1 (H) 1.7 - 7.7 K/uL   Lymphocytes Relative 18 %   Lymphs Abs 1.9 0.7 - 4.0 K/uL   Monocytes Relative 3 %   Monocytes Absolute 0.4 0.1 - 1.0 K/uL   Eosinophils Relative 1 %   Eosinophils Absolute 0.1 0.0 - 0.5 K/uL   Basophils Relative 1 %   Basophils Absolute 0.1 0.0 - 0.1 K/uL   Immature Granulocytes 1 %   Abs Immature Granulocytes 0.07 0.00 - 0.07 K/uL    Comment: Performed at [facility], [address], [place], [place] [address]  Comprehensive metabolic panel     Status: Abnormal   Collection Time: 09/12/22 11:14 PM  Result Value Ref Range   Sodium 140 135 - 145 mmol/L   Potassium 3.5 3.5 - 5.1 mmol/L   Chloride 105 98 - 111 mmol/L   CO2 24 22 - 32 mmol/L   Glucose, Bld 131 (H) 70 - 99 mg/dL    Comment: Glucose reference range applies only to samples taken after fasting for at least 8 hours.   BUN 9 6 - 20 mg/dL   Creatinine, Ser 3.55 0.44 - 1.00 mg/dL   Calcium 9.0 8.9 - 73.2 mg/dL   Total Protein 7.2 6.5 - 8.1 g/dL   Albumin 3.9 3.5 - 5.0 g/dL   AST 24 15 - 41 U/L   ALT 35 0 - 44 U/L   Alkaline Phosphatase 89 38 - 126 U/L   Total Bilirubin 0.4 0.3 - 1.2 mg/dL   GFR, Estimated >20 >25 mL/min    Comment: (NOTE) Calculated using the CKD-EPI Creatinine Equation (2021)    Anion gap 11 5 - 15    Comment: Performed at [facility], [address] [place], [place] [address]  Lipase, blood     Status: None   Collection Time: 09/12/22 11:14 PM  Result Value Ref Range   Lipase 46 11 - 51 U/L    Comment: Performed at [facility], [address], [place], [place] [address]  Urinalysis, Routine w reflex microscopic -Urine, Clean Catch     Status: Abnormal   Collection Time: 09/13/22  1:08 AM  Result Value Ref Range   Color, Urine STRAW (A) YELLOW   APPearance CLEAR CLEAR   Specific Gravity, Urine 1.005 1.005 - 1.030   pH 5.0 5.0 - 8.0   Glucose, UA  NEGATIVE NEGATIVE mg/dL   Hgb urine dipstick NEGATIVE NEGATIVE   Bilirubin Urine NEGATIVE NEGATIVE   Ketones, ur NEGATIVE NEGATIVE mg/dL   Protein, ur NEGATIVE NEGATIVE  mg/dL   Nitrite NEGATIVE NEGATIVE   Leukocytes,Ua NEGATIVE NEGATIVE    Comment: Performed at [facility], [address], [place], [place] [address]        Garner Nash, MD, MS

## 2022-09-17 NOTE — Assessment & Plan Note (Signed)
Discussed potential medications (Phentermine/Topiramate) for weight loss and mood improvement. Referral to bariatrics for further evaluation and potential surgical options. - Dietitian and counseling for lifestyle changes.

## 2022-09-17 NOTE — Assessment & Plan Note (Signed)
Continue cardiology follow-up. Normal results from recent cardiac workup.

## 2022-09-19 ENCOUNTER — Encounter (INDEPENDENT_AMBULATORY_CARE_PROVIDER_SITE_OTHER): Payer: Self-pay

## 2022-09-20 ENCOUNTER — Telehealth: Payer: Self-pay | Admitting: Family Medicine

## 2022-09-20 ENCOUNTER — Encounter: Payer: Self-pay | Admitting: Family Medicine

## 2022-09-20 NOTE — Telephone Encounter (Signed)
Pt said the last medication you prescribe from her is 300.00 and the insurance will not cover it. Can you please give the pt a call

## 2022-09-21 NOTE — Addendum Note (Signed)
Addended by: Garnette Gunner on: 09/21/2022 04:39 PM   Modules accepted: Orders

## 2022-09-24 NOTE — Telephone Encounter (Signed)
Pt reviewed mychart message.   The following abnormalities are noted:   Patient tested positive for benzodiazepine, which  was disclosed or seen in prescription history. As discussed at the visit, this is a breach of policy and I will not be able to continue to prescribe controlled substances.  Written by Garnette Gunner, MD on 09/21/2022  4:39 PM EDT Seen by patient Judith Bentley on 09/23/2022  2:29 PM

## 2022-10-08 ENCOUNTER — Ambulatory Visit
Admission: RE | Admit: 2022-10-08 | Discharge: 2022-10-08 | Disposition: A | Discharge status: Home / Self Care | Payer: 59 | Source: Ambulatory Visit | Attending: Family Medicine | Admitting: Family Medicine

## 2022-10-08 DIAGNOSIS — Z0000 Encounter for general adult medical examination without abnormal findings: Secondary | ICD-10-CM

## 2022-10-09 ENCOUNTER — Encounter: Payer: Self-pay | Admitting: Adult Health

## 2022-10-09 ENCOUNTER — Ambulatory Visit: Payer: 59 | Admitting: Adult Health

## 2022-10-09 VITALS — BP 130/80 | HR 81 | Ht 69.0 in | Wt 284.0 lb

## 2022-10-09 DIAGNOSIS — G4734 Idiopathic sleep related nonobstructive alveolar hypoventilation: Secondary | ICD-10-CM | POA: Diagnosis not present

## 2022-10-09 DIAGNOSIS — G4733 Obstructive sleep apnea (adult) (pediatric): Secondary | ICD-10-CM

## 2022-10-09 NOTE — Patient Instructions (Addendum)
Continue nightly use of CPAP with ensuring greater than 4 hours per night for optimal benefit and per insurance requirements  Continue to follow with your DME company [employer] for ongoing supplies and any CPAP related concerns  You will be called to schedule an overnight oximetry test to ensure oxygen levels have improved with use of CPAP     Follow up visit in 6 months or call earlier if needed

## 2022-10-09 NOTE — Progress Notes (Signed)
[facility] [address] [place] [address] [phone]       OFFICE FOLLOW UP NOTE  [name] Date of Birth:  01-Apr-1975 Medical Record Number:  [id number]    Primary neurologist: Dr. Frances Furbish Reason for visit: Initial CPAP follow-up    SUBJECTIVE:   CHIEF COMPLAINT:  Chief Complaint  Patient presents with   Follow-up    Rm 8. Alone. She recently began using a nasal mask. Her face mask was not working well. She continues to struggle using CPAP.   Follow-up visit:  Prior visit: 11/10/2022 with Dr. Frances Furbish  Brief HPI:   Judith Bentley is a 47 y.o. female who was initially seen by Dr. Frances Furbish on 07/10/2022 for concern of underlying sleep apnea with reported snoring, excessive daytime somnolence, occasional morning headaches and witnessed apneas.  ESS 11/24. FSS 63/63.  HST 07/31/2022 showed severe OSA with total AHI of 48.1/h and O2 nadir of 76% with time below or at 88% saturation of over 50 minutes indicating nocturnal hypoxemia.  AutoPap initiated 08/21/2022.   Interval history:  CPAP compliance report shows satisfactory usage and optimal residual AHI.  Initially using FFM but difficulty tolerating.  Recently switched to nasal pillow, initial nasal pillow mask too small, received new mask last week and feels like she is tolerating her new mask better. Still has fatigue but feels this is due to not sleeping well due to mask intolerance.  Headaches have been gradually improving over the past week. ESS 10/24 (prior 11/24). FSS 60/63 (prior 63/63).             ROS:   14 system review of systems performed and negative with exception of those listed in HPI  PMH:  Past Medical History:  Diagnosis Date   Allergy    Seasonal allergies   Anxious appearance 06/15/2022   COVID-19    Cyclical vomiting syndrome    Encounter for therapeutic drug monitoring 09/17/2022   GERD (gastroesophageal reflux disease)    Hyperlipidemia 8/22?    Hypertension    Sleep apnea    I have not been diagnosed, but have symptoms    PSH:  Past Surgical History:  Procedure Laterality Date   CESAREAN SECTION     CHOLECYSTECTOMY     TUBAL LIGATION  03/08/05    Social History:  Social History   Socioeconomic History   Marital status: Married    Spouse name: Not on file   Number of children: 6   Years of education: Not on file   Highest education level: Not on file  Occupational History   Occupation: housewife  Tobacco Use   Smoking status: Never   Smokeless tobacco: Never   Tobacco comments:    Never smoked  Vaping Use   Vaping status: Never Used  Substance and Sexual Activity   Alcohol use: Never   Drug use: Never   Sexual activity: Yes    Birth control/protection: Other-see comments    Comment: Tubes tied and suspected menopause  Other Topics Concern   Not on file  Social History Narrative   Not on file   Social Determinants of Health   Financial Resource Strain: Not on file  Food Insecurity: Not on file  Transportation Needs: Not on file  Physical Activity: Not on file  Stress: Not on file  Social Connections: Not on file  Intimate Partner Violence: Not on file    Family History:  Family History  Problem Relation Age of Onset   High  Cholesterol Mother    COPD Mother    Depression Mother    Hyperlipidemia Mother    Hypertension Mother    Sleep apnea Mother    ADD / ADHD Mother    Anxiety disorder Mother    Obesity Mother    Pancreatic cancer Father    Cancer Father    Diabetes Mellitus II Maternal Grandfather    Breast cancer Neg Hx     Medications:   Current Outpatient Medications on File Prior to Visit  Medication Sig Dispense Refill   furosemide (LASIX) 20 MG tablet Take 1 tablet (20 mg total) by mouth daily. (Patient taking differently: Take 20 mg by mouth daily as needed.) 30 tablet 3   Multiple Vitamin (MULTIVITAMIN PO) Take by mouth.     omeprazole (PRILOSEC) 20 MG capsule Take 20 mg by  mouth 2 (two) times daily before a meal.     promethazine (PHENERGAN) 25 MG tablet Take 1 tablet (25 mg total) by mouth every 8 (eight) hours as needed for nausea or vomiting. 10 tablet 0   No current facility-administered medications on file prior to visit.    Allergies:   Allergies  Allergen Reactions   Codeine Nausea And Vomiting      OBJECTIVE:  Physical Exam  Vitals:   10/09/22 1300  BP: 130/80  Pulse: 81  Weight: 284 lb (128.8 kg)  Height: 5\' 9"  (1.753 m)   Body mass index is 41.94 kg/m. No results found.   General: well developed, well nourished, pleasant middle-age Caucasian female, seated, in no evident distress Head: head normocephalic and atraumatic.   Neck: supple with no carotid or supraclavicular bruits Cardiovascular: regular rate and rhythm, no murmurs Musculoskeletal: no deformity Skin:  no rash/petichiae Vascular:  Normal pulses all extremities   Neurologic Exam Mental Status: Awake and fully alert. Oriented to place and time. Recent and remote memory intact. Attention span, concentration and fund of knowledge appropriate. Mood and affect appropriate.  Cranial Nerves: Pupils equal, briskly reactive to light. Extraocular movements full without nystagmus. Visual fields full to confrontation. Hearing intact. Facial sensation intact. Face, tongue, palate moves normally and symmetrically.  Motor: Normal bulk and tone. Normal strength in all tested extremity muscles Sensory.: intact to touch , pinprick , position and vibratory sensation.  Coordination: Rapid alternating movements normal in all extremities. Finger-to-nose and heel-to-shin performed accurately bilaterally. Gait and Station: Arises from chair without difficulty. Stance is normal. Gait demonstrates normal stride length and balance without use of AD.  Reflexes: 1+ and symmetric. Toes downgoing.         ASSESSMENT/PLAN: Judith Bentley is a 47 y.o. year old female with severe sleep apnea  with nocturnal hypoxemia now on CPAP therapy.    OSA on CPAP : Compliance report shows satisfactory usage with optimal residual AHI.  Continue current pressure settings.  Recently received new nasal pillow mask and seems to tolerate better than FFM. Still fatigue and occasional headaches but patient feels due to not sleeping well due to prior mask intolerance. Would still like to complete ONO to ensure resolution of nocturnal hypoxemia on CPAP.  Discussed continued nightly usage with ensuring greater than 4 hours nightly for optimal benefit and per insurance purposes.  Continue to follow with DME company for any needed supplies or CPAP related concerns     Follow up in 6 months or call earlier if needed   CC:  PCP: Garnette Gunner, MD    I spent 30 minutes of face-to-face and  non-face-to-face time with patient.  This included previsit chart review, lab review, study review, order entry, electronic health record documentation, patient education regarding diagnosis of sleep apnea with review and discussion of compliance report and answered all other questions to patient's satisfaction   Ihor Austin, [facility]  [facility] [address] [place], [place] [address]  Phone [phone] Fax [phone] Note: This document was prepared with digital dictation and possible smart phrase technology. Any transcriptional errors that result from this process are unintentional.

## 2022-10-11 ENCOUNTER — Other Ambulatory Visit: Payer: Self-pay | Admitting: Family Medicine

## 2022-10-11 DIAGNOSIS — R928 Other abnormal and inconclusive findings on diagnostic imaging of breast: Secondary | ICD-10-CM

## 2022-10-15 ENCOUNTER — Ambulatory Visit: Payer: 59 | Admitting: Family Medicine

## 2022-10-15 ENCOUNTER — Encounter: Payer: Self-pay | Admitting: Family Medicine

## 2022-10-15 VITALS — BP 124/80 | HR 98 | Temp 98.2°F | Wt 281.8 lb

## 2022-10-15 DIAGNOSIS — G4733 Obstructive sleep apnea (adult) (pediatric): Secondary | ICD-10-CM | POA: Diagnosis not present

## 2022-10-15 DIAGNOSIS — R1115 Cyclical vomiting syndrome unrelated to migraine: Secondary | ICD-10-CM | POA: Diagnosis not present

## 2022-10-15 DIAGNOSIS — F4321 Adjustment disorder with depressed mood: Secondary | ICD-10-CM | POA: Diagnosis not present

## 2022-10-15 DIAGNOSIS — Z6841 Body Mass Index (BMI) 40.0 and over, adult: Secondary | ICD-10-CM

## 2022-10-15 MED ORDER — PROMETHAZINE HCL 25 MG PO TABS
25.0000 mg | ORAL_TABLET | Freq: Three times a day (TID) | ORAL | 0 refills | Status: DC | PRN
Start: 2022-10-15 — End: 2023-03-13

## 2022-10-15 MED ORDER — NALTREXONE HCL 50 MG PO TABS: 25.0000 mg | ORAL_TABLET | Freq: Every day | ORAL | 0 refills | Status: DC

## 2022-10-15 MED ORDER — BUPROPION HCL ER (XL) 150 MG PO TB24
150.0000 mg | ORAL_TABLET | Freq: Every day | ORAL | 0 refills | Status: DC
Start: 2022-10-15 — End: 2022-11-27

## 2022-10-15 NOTE — Assessment & Plan Note (Signed)
Discussed various weight management options. Contrave (bupropion and naltrexone) discussed as a potential treatment, but concerns about insurance coverage. Alternatives: prescription of individual components (bupropion and naltrexone), starting with Wellbutrin (bupropion) 150 mg and naltrexone 25 mg. Current prescription: Phentermine previously considered but too costly.

## 2022-10-15 NOTE — Assessment & Plan Note (Signed)
Recent switch to a nasal CPAP mask. Issues with fit and leakage, now seemingly resolved with proper size. Continued monitoring and adjustment as needed.

## 2022-10-15 NOTE — Assessment & Plan Note (Signed)
Refill of Phenergan (Promethazine) for management of nausea. Patient reports stress-related nausea and vomiting, exacerbated by recent events.

## 2022-10-15 NOTE — Progress Notes (Signed)
Assessment/Plan:   Problem List Items Addressed This Visit       Respiratory   Obstructive sleep apnea syndrome    Recent switch to a nasal CPAP mask. Issues with fit and leakage, now seemingly resolved with proper size. Continued monitoring and adjustment as needed.        Digestive   Cyclical vomiting syndrome - Primary    Refill of Phenergan (Promethazine) for management of nausea. Patient reports stress-related nausea and vomiting, exacerbated by recent events.      Relevant Medications   promethazine (PHENERGAN) 25 MG tablet     Other   Class 3 severe obesity with serious comorbidity and body mass index (BMI) of 40.0 to 44.9 in adult [facility])    Discussed various weight management options. Contrave (bupropion and naltrexone) discussed as a potential treatment, but concerns about insurance coverage. Alternatives: prescription of individual components (bupropion and naltrexone), starting with Wellbutrin (bupropion) 150 mg and naltrexone 25 mg. Current prescription: Phentermine previously considered but too costly.      Relevant Medications   naltrexone (DEPADE) 50 MG tablet   buPROPion (WELLBUTRIN XL) 150 MG 24 hr tablet   Adjustment disorder with depressed mood   Relevant Medications   naltrexone (DEPADE) 50 MG tablet   buPROPion (WELLBUTRIN XL) 150 MG 24 hr tablet    Medications Discontinued During This Encounter  Medication Reason   promethazine (PHENERGAN) 25 MG tablet Reorder    No follow-ups on file.    Subjective:   Encounter date: 10/15/2022  Judith Bentley is a 47 y.o. female who has Syncope; Anemia; Pneumonia due to COVID-19 virus; Class 2 obesity due to excess calories with body mass index (BMI) of 38.0 to 38.9 in adult; Cyclical vomiting syndrome; Exertional dyspnea; Cough; Postviral fatigue syndrome; Chest pain; Nausea; Apnea; PVC (premature ventricular contraction); Abdominal pain, chronic, left lower quadrant; Anxious appearance; Class 3  severe obesity with serious comorbidity and body mass index (BMI) of 40.0 to 44.9 in adult [facility]); Obstructive sleep apnea syndrome; Adjustment disorder with depressed mood; and Encounter for therapeutic drug monitoring on their problem list..   She  has a past medical history of Allergy, Anxious appearance (06/15/2022), COVID-19, Cyclical vomiting syndrome, Encounter for therapeutic drug monitoring (09/17/2022), GERD (gastroesophageal reflux disease), Hyperlipidemia (8/22?), Hypertension, and Sleep apnea..   History of Present Illness:   Weight Management. Judith Bentley returns for a follow-up concerning weight management. She was initially scheduled to review the prescription given on the previous visit but could not get it filled due to insurance issues; the cost was $350. A drug test reportedly presented an issue with prescribing the medication, and Judith Bentley inquired if the positive result could be related to sedation drugs from her recent colonoscopy on September 12, 2021. The patient does not engage in drug use, as confirmed by a review of medications (Tylenol, ibuprofen). She suggested following up with the procedure providers for a detailed medication report. Considering the prescription cost, alternative medications, including non-controlled ones, were discussed.  Nausea and Vomiting. Judith Bentley reports episodes of nausea and vomiting exacerbated by stress. Recent stressors included her daughter's dog being hit by a car, and she mentioned using Phenergan effectively if taken early. She requested an additional prescription due to a low remaining supply. Recent sleep disturbances and ongoing stress have potentially contributed to these symptoms.  CPAP Use. Judith Bentley is in the process of adjusting to a new nasal CPAP mask after issues with previous ones. She has started using the correct size mask recently but  still experiences difficulties adjusting, including feelings of suffocation and disruptions during  episodes of nausea.  ROS  Past Surgical History:  Procedure Laterality Date   CESAREAN SECTION     CHOLECYSTECTOMY     TUBAL LIGATION  03/08/05    Outpatient Medications Prior to Visit  Medication Sig Dispense Refill   Multiple Vitamin (MULTIVITAMIN PO) Take by mouth.     omeprazole (PRILOSEC) 20 MG capsule Take 20 mg by mouth 2 (two) times daily before a meal.     promethazine (PHENERGAN) 25 MG tablet Take 1 tablet (25 mg total) by mouth every 8 (eight) hours as needed for nausea or vomiting. 10 tablet 0   furosemide (LASIX) 20 MG tablet Take 1 tablet (20 mg total) by mouth daily. (Patient taking differently: Take 20 mg by mouth daily as needed.) 30 tablet 3   No facility-administered medications prior to visit.    Family History  Problem Relation Age of Onset   High Cholesterol Mother    COPD Mother    Depression Mother    Hyperlipidemia Mother    Hypertension Mother    Sleep apnea Mother    ADD / ADHD Mother    Anxiety disorder Mother    Obesity Mother    Pancreatic cancer Father    Cancer Father    Diabetes Mellitus II Maternal Grandfather    Breast cancer Neg Hx     Social History   Socioeconomic History   Marital status: Married    Spouse name: Not on file   Number of children: 6   Years of education: Not on file   Highest education level: 12th grade  Occupational History   Occupation: housewife  Tobacco Use   Smoking status: Never   Smokeless tobacco: Never   Tobacco comments:    Never smoked  Vaping Use   Vaping status: Never Used  Substance and Sexual Activity   Alcohol use: Never   Drug use: Never   Sexual activity: Yes    Birth control/protection: Other-see comments    Comment: Tubes tied and suspected menopause  Other Topics Concern   Not on file  Social History Narrative   Not on file   Social Determinants of Health   Financial Resource Strain: High Risk (10/10/2022)   Overall Financial Resource Strain (CARDIA)    Difficulty of Paying  Living Expenses: Hard  Food Insecurity: Food Insecurity Present (10/10/2022)   Hunger Vital Sign    Worried About Running Out of Food in the Last Year: Sometimes true    Ran Out of Food in the Last Year: Never true  Transportation Needs: No Transportation Needs (10/10/2022)   PRAPARE - Administrator, Civil Service (Medical): No    Lack of Transportation (Non-Medical): No  Physical Activity: Unknown (10/10/2022)   Exercise Vital Sign    Days of Exercise per Week: 0 days    Minutes of Exercise per Session: Not on file  Stress: Stress Concern Present (10/10/2022)   [employer] of Occupational Health - Occupational Stress Questionnaire    Feeling of Stress : To some extent  Social Connections: Socially Integrated (10/10/2022)   Social Connection and Isolation Panel [NHANES]    Frequency of Communication with Friends and Family: More than three times a week    Frequency of Social Gatherings with Friends and Family: More than three times a week    Attends Religious Services: More than 4 times per year    Active Member of [employer] or Organizations:  Yes    Attends Banker Meetings: More than 4 times per year    Marital Status: Married  Catering manager Violence: Not on file                                                                                                  Objective:  Physical Exam: BP 124/80 (BP Location: Left Arm, Patient Position: Sitting, Cuff Size: Large)   Pulse 98   Temp 98.2 F (36.8 C) (Temporal)   Wt 281 lb 12.8 oz (127.8 kg)   SpO2 98%   BMI 41.61 kg/m     Physical Exam  MM 3D SCREENING MAMMOGRAM BILATERAL BREAST  Result Date: 10/10/2022 CLINICAL DATA:  Screening. This is the patient's initial baseline mammogram. EXAM: DIGITAL SCREENING BILATERAL MAMMOGRAM WITH TOMOSYNTHESIS AND CAD TECHNIQUE: Bilateral screening digital craniocaudal and mediolateral oblique mammograms were obtained. Bilateral screening digital breast tomosynthesis  was performed. The images were evaluated with computer-aided detection. COMPARISON:  None. ACR Breast Density Category a: The breasts are almost entirely fatty. FINDINGS: In the left breast, a possible mass warrants further evaluation. In the right breast, no findings suspicious for malignancy. IMPRESSION: Further evaluation is suggested for a possible mass in the left breast. RECOMMENDATION: Diagnostic mammogram and possibly ultrasound of the left breast. (Code:[id number]) The patient will be contacted regarding the findings, and additional imaging will be scheduled. BI-RADS CATEGORY  0: Incomplete: Need additional imaging evaluation. Electronically Signed   By: Hulan Saas M.D.   On: 10/10/2022 12:25   CT CARDIAC SCORING (SELF PAY ONLY)  Addendum Date: 08/22/2022   ADDENDUM REPORT: 08/22/2022 03:18 EXAM: OVER-READ INTERPRETATION  CT CHEST The following report is an over-read performed by radiologist Dr. Alcide Clever of [facility] on 08/22/2022. This over-read does not include interpretation of cardiac or coronary anatomy or pathology. The coronary calcium score interpretation by the cardiologist is attached. COMPARISON:  09/07/2019 FINDINGS: Cardiovascular: There are no significant extracardiac vascular findings. Mediastinum/Nodes: There are no enlarged lymph nodes within the visualized mediastinum. Lungs/Pleura: There is no pleural effusion. The visualized lungs appear clear. Upper abdomen: No significant findings in the visualized upper abdomen. Musculoskeletal/Chest wall: No chest wall mass or suspicious osseous findings within the visualized chest. IMPRESSION: No significant extracardiac findings within the visualized chest. Electronically Signed   By: Alcide Clever M.D.   On: 08/22/2022 03:18   Result Date: 08/22/2022 CLINICAL DATA:  Cardiovascular Disease Risk stratification EXAM: Coronary Calcium Score TECHNIQUE: A gated, non-contrast computed tomography scan of the heart was performed  using 3mm slice thickness. Axial images were analyzed on a dedicated workstation. Calcium scoring of the coronary arteries was performed using the Agatston method. FINDINGS: Coronary Calcium Score: Left main: 0 Left anterior descending artery: 0 Left circumflex artery: 0 Right coronary artery: 0 Total: 0 Percentile: 0 Pericardium: Normal. Ascending Aorta: Normal caliber. Non-cardiac: See separate report from [facility]. IMPRESSION: Coronary calcium score of 0. This was 0 percentile for age-, race-, and sex-matched controls. RECOMMENDATIONS: Coronary artery calcium (CAC) score is a strong predictor of incident coronary heart disease (CHD) and provides predictive  information beyond traditional risk factors. CAC scoring is reasonable to use in the decision to withhold, postpone, or initiate statin therapy in intermediate-risk or selected borderline-risk asymptomatic adults (age 51-75 years and LDL-C >=70 to <190 mg/dL) who do not have diabetes or established atherosclerotic cardiovascular disease (ASCVD).* In intermediate-risk (10-year ASCVD risk >=7.5% to <20%) adults or selected borderline-risk (10-year ASCVD risk >=5% to <7.5%) adults in whom a CAC score is measured for the purpose of making a treatment decision the following recommendations have been made: If CAC=0, it is reasonable to withhold statin therapy and reassess in 5 to 10 years, as long as higher risk conditions are absent (diabetes mellitus, family history of premature CHD in first degree relatives (males <55 years; females <65 years), cigarette smoking, or LDL >=190 mg/dL). If CAC is 1 to 99, it is reasonable to initiate statin therapy for patients >=43 years of age. If CAC is >=100 or >=75th percentile, it is reasonable to initiate statin therapy at any age. Cardiology referral should be considered for patients with CAC scores >=400 or >=75th percentile. *2018 AHA/ACC/AACVPR/AAPA/ABC/ACPM/ADA/AGS/APhA/ASPC/NLA/PCNA Guideline on the  Management of Blood Cholesterol: A Report of the American College of Cardiology/American Heart Association Task Force on Clinical Practice Guidelines. J Am Coll Cardiol. 2019;73(24):3168-3209. Olga Millers, MD Electronically Signed: By: Olga Millers M.D. On: 08/16/2022 11:03   PCV MYOCARDIAL PERFUSION WO LEXISCAN  Result Date: 08/16/2022 Exercise nuclear stress test 08/13/2022: Myocardial perfusion is normal. Overall LV systolic function is normal without regional wall motion abnormalities. Stress LV EF: 68%. Normal ECG stress. Patient exercised on Bruce protocol with grade adjustment to max of 25 degrees at patient request, exercised for 6:42 min and achieved 14.91 METS & 88% MPHR. Stress symptoms included fatigue. Resting BP 134/70 mm Hg, peak BP  180/90 mm Hg. BP response Normal. Exercise capacity was excellent. No previous exam available for comparison. Low risk.   Home sleep test  Result Date: 07/31/2022 Huston Foley, MD     08/07/2022  5:34 PM  [facility] HOME SLEEP TEST (Watch PAT) REPORT  -  Mail-out Device STUDY DATE: 08/03/22 DOB: 02/16/1976 MRN: [id number] ORDERING CLINICIAN: Huston Foley, MD, PhD  REFERRING CLINICIAN: Dr. Truett Mainland CLINICAL INFORMATION/HISTORY: 47 year old female with an underlying medical history of palpitations with PVCs, hyperlipidemia, hypertension, allergies, reflux disease, cyclical vomiting syndrome, history of syncope, dyspnea on exertion, leg swelling, and severe obesity with a BMI of over 40, who reports snoring and excessive daytime somnolence as well as witnessed apneas. Epworth sleepiness score: 11/24. BMI: 41.5 kg/m FINDINGS: Sleep Summary: Total Recording Time (hours, min): 9 hours, 59 min Total Sleep Time (hours, min):  9 hours, 31 min Percent REM (%):    Inconclusive REM detection Respiratory Indices: Calculated pAHI (per hour):  48.1/hour       REM pAHI:    N/A     NREM pAHI: 48.1/hour Central pAHI: 1.3/hour Oxygen Saturation  Statistics:  Oxygen Saturation (%) Mean: 93% Minimum oxygen saturation (%):                 76% O2 Saturation Range (%): 76 - 98%  O2 Saturation (minutes) <=88%: 17.6 min Pulse Rate Statistics: Pulse Mean (bpm):    70/min  Pulse Range (48 - 106/min) IMPRESSION: OSA (obstructive sleep apnea), severe Nocturnal Hypoxemia RECOMMENDATION: This home sleep test demonstrates severe obstructive sleep apnea with a total AHI of 48.1/hour and O2 nadir of 76% with time below or at 88% saturation of over 15 minutes, indicating nocturnal hypoxemia.  Snoring was detected, in the moderate to loud range, rarely milder.  Treatment with positive airway pressure is highly recommended. The patient will be advised to proceed with an autoPAP titration/trial at home. A laboratory attended titration study can be considered in the future for optimization of treatment settings and to improve tolerance and compliance. Alternative treatment options are limited secondary to the severity of the patient's sleep disordered breathing, but may include surgical treatment with an implantable hypoglossal nerve stimulator (in carefully selected candidates, meeting criteria).  Concomitant weight loss is recommended, where clinically appropriate. Please note, that untreated obstructive sleep apnea may carry additional perioperative morbidity. Patients with significant obstructive sleep apnea should receive perioperative PAP therapy and the surgeons and particularly the anesthesiologist should be informed of the diagnosis and the severity of the sleep disordered breathing. The patient should be cautioned not to drive, work at heights, or operate dangerous or heavy equipment when tired or sleepy. Review and reiteration of good sleep hygiene measures should be pursued with any patient. While waiting for autoPAP set up at home, I recommend patient sleep with HOB mildly elevated between 30-45 degrees and primarily sleep on her sides. Other causes of the  patient's symptoms, including circadian rhythm disturbances, an underlying mood disorder, medication effect and/or an underlying medical problem cannot be ruled out based on this test. Clinical correlation is recommended. The patient and her referring provider will be notified of the test results. The patient will be seen in follow up in sleep clinic at [facility]. I certify that I have reviewed the raw data recording prior to the issuance of this report in accordance with the standards of the American Academy of Sleep Medicine (AASM). INTERPRETING PHYSICIAN: Huston Foley, MD, PhD Medical Director, [facility]) Diplomat, ABPN (Neurology and Sleep) [facility] [address] [place], [place] [address] [phone]    Recent Results (from the past 2160 hour(s))  CBC     Status: None   Collection Time: 07/18/22  1:36 PM  Result Value Ref Range   WBC 6.4 3.4 - 10.8 x10E3/uL   RBC 4.57 3.77 - 5.28 x10E6/uL   Hemoglobin 12.3 11.1 - 15.9 g/dL   Hematocrit 08.6 57.8 - 46.6 %   MCV 83 79 - 97 fL   MCH 26.9 26.6 - 33.0 pg   MCHC 32.5 31.5 - 35.7 g/dL   RDW 46.9 62.9 - 52.8 %   Platelets 308 150 - 450 x10E3/uL  Basic metabolic panel     Status: Abnormal   Collection Time: 07/18/22  1:36 PM  Result Value Ref Range   Glucose 100 (H) 70 - 99 mg/dL   BUN 15 6 - 24 mg/dL   Creatinine, Ser 4.13 0.57 - 1.00 mg/dL   eGFR 244 >01 UU/VOZ/3.66   BUN/Creatinine Ratio 22 9 - 23   Sodium 141 134 - 144 mmol/L   Potassium 4.2 3.5 - 5.2 mmol/L   Chloride 102 96 - 106 mmol/L   CO2 26 20 - 29 mmol/L   Calcium 9.4 8.7 - 10.2 mg/dL  Pro b natriuretic peptide (BNP)[id number])     Status: None   Collection Time: 07/18/22  1:36 PM  Result Value Ref Range   NT-Pro BNP 41 0 - 249 pg/mL    Comment: The following cut-points have been suggested for the use of proBNP for the diagnostic evaluation of heart failure (HF) in patients with acute  dyspnea: Modality  Age           Optimal Cut                            (years)            Point ------------------------------------------------------ Diagnosis (rule in HF)        <50            450 pg/mL                           50 - 75            900 pg/mL                               >75           1800 pg/mL Exclusion (rule out HF)  Age independent     300 pg/mL   TSH     Status: None   Collection Time: 07/18/22  1:36 PM  Result Value Ref Range   TSH 3.330 0.450 - 4.500 uIU/mL  Lipid panel     Status: Abnormal   Collection Time: 08/10/22  1:37 PM  Result Value Ref Range   Cholesterol, Total 272 (H) 100 - 199 mg/dL   Triglycerides 109 (H) 0 - 149 mg/dL   HDL 54 >32 mg/dL   VLDL Cholesterol Cal 85 (H) 5 - 40 mg/dL   LDL Chol Calc (NIH) 355 (H) 0 - 99 mg/dL   Chol/HDL Ratio 5.0 (H) 0.0 - 4.4 ratio    Comment:                                   T. Chol/HDL Ratio                                             Men  Women                               1/2 Avg.Risk  3.4    3.3                                   Avg.Risk  5.0    4.4                                2X Avg.Risk  9.6    7.1                                3X Avg.Risk 23.4   11.0   CBC with Differential     Status: Abnormal   Collection Time: 09/12/22 11:14 PM  Result Value Ref Range   WBC 10.5 4.0 - 10.5 K/uL   RBC 4.46 3.87 - 5.11 MIL/uL   Hemoglobin 11.9 (L) 12.0 - 15.0 g/dL   HCT 73.2 20.2 - 54.2 %   MCV 83.6 80.0 - 100.0 fL   MCH 26.7 26.0 -  34.0 pg   MCHC 31.9 30.0 - 36.0 g/dL   RDW 09.6 04.5 - 40.9 %   Platelets 263 150 - 400 K/uL   nRBC 0.0 0.0 - 0.2 %   Neutrophils Relative % 76 %   Neutro Abs 8.1 (H) 1.7 - 7.7 K/uL   Lymphocytes Relative 18 %   Lymphs Abs 1.9 0.7 - 4.0 K/uL   Monocytes Relative 3 %   Monocytes Absolute 0.4 0.1 - 1.0 K/uL   Eosinophils Relative 1 %   Eosinophils Absolute 0.1 0.0 - 0.5 K/uL   Basophils Relative 1 %   Basophils Absolute 0.1 0.0 - 0.1 K/uL   Immature  Granulocytes 1 %   Abs Immature Granulocytes 0.07 0.00 - 0.07 K/uL    Comment: Performed at [facility], [address] [place], [place] [address]  Comprehensive metabolic panel     Status: Abnormal   Collection Time: 09/12/22 11:14 PM  Result Value Ref Range   Sodium 140 135 - 145 mmol/L   Potassium 3.5 3.5 - 5.1 mmol/L   Chloride 105 98 - 111 mmol/L   CO2 24 22 - 32 mmol/L   Glucose, Bld 131 (H) 70 - 99 mg/dL    Comment: Glucose reference range applies only to samples taken after fasting for at least 8 hours.   BUN 9 6 - 20 mg/dL   Creatinine, Ser 4.78 0.44 - 1.00 mg/dL   Calcium 9.0 8.9 - 29.5 mg/dL   Total Protein 7.2 6.5 - 8.1 g/dL   Albumin 3.9 3.5 - 5.0 g/dL   AST 24 15 - 41 U/L   ALT 35 0 - 44 U/L   Alkaline Phosphatase 89 38 - 126 U/L   Total Bilirubin 0.4 0.3 - 1.2 mg/dL   GFR, Estimated >62 >13 mL/min    Comment: (NOTE) Calculated using the CKD-EPI Creatinine Equation (2021)    Anion gap 11 5 - 15    Comment: Performed at [facility], [address] [place], [place] [address]  Lipase, blood     Status: None   Collection Time: 09/12/22 11:14 PM  Result Value Ref Range   Lipase 46 11 - 51 U/L    Comment: Performed at [facility], [address], [place], [place] [address]  HM COLONOSCOPY     Status: None   Collection Time: 09/13/22 12:00 AM  Result Value Ref Range   HM Colonoscopy See Report (in chart) See Report (in chart), Patient Reported    Comment: [facility]  Urinalysis, Routine w reflex microscopic -Urine, Clean Catch     Status: Abnormal   Collection Time: 09/13/22  1:08 AM  Result Value Ref Range   Color, Urine STRAW (A) YELLOW   APPearance CLEAR CLEAR   Specific Gravity, Urine 1.005 1.005 - 1.030   pH 5.0 5.0 - 8.0   Glucose, UA NEGATIVE NEGATIVE mg/dL   Hgb urine dipstick NEGATIVE NEGATIVE   Bilirubin Urine NEGATIVE NEGATIVE   Ketones, ur NEGATIVE NEGATIVE mg/dL   Protein, ur NEGATIVE NEGATIVE mg/dL   Nitrite NEGATIVE  NEGATIVE   Leukocytes,Ua NEGATIVE NEGATIVE    Comment: Performed at [facility], [address] [place], [place] [address]  ToxAssure Flex 23, Ur     Status: None   Collection Time: 09/17/22 10:18 AM  Result Value Ref Range   Prescribed Drug 2 FINAL     Comment: ==================================================================== Tox[employer] Flex 23, Ur ==================================================================== Test  Result       Flag       Units  Drug Present   Alpha-hydroxymidazolam         56                      ng/mg creat    Alpha-hydroxymidazolam is an expected metabolite of midazolam.    Source of midazolam is a scheduled prescription medication.  ==================================================================== Test                      Result    Flag   Units      Ref Range   Creatinine              62               mg/dL      >=18 ==================================================================== Declared Medications:  Medication list was not provided. ==================================================================== For clinical consultation, please call [phone]. ====================================================================    Creatinine 62 mg/dL    Comment: REFERENCE RANGE: Ref Range>=20   AMPHETAMINES IA Negative CUTOFF:300 ng/mL   Benzodiazepines +POSITIVE+    Diazepam Not Detected ng/mg creat   Desmethyldiazepam Not Detected ng/mg creat   Oxazepam Not Detected ng/mg creat   Temazepam Not Detected ng/mg creat    Comment: Expected metabolism of benzodiazepine class drugs:   Parent Drug       Detected Metabolites  -----------       --------------------  Diazepam:         Desmethyldiazepam, Temazepam, Oxazepam  Chlordiazepoxide: Desmethyldiazepam, Oxazepam  Clorazepate:      Desmethyldiazepam, Oxazepam  Halazepam:        Desmethyldiazepam, Oxazepam  Temazepam:        Oxazepam  Oxazepam:          None    Alprazolam Not Detected ng/mg creat   Alpha-hydroxyalprazolam Not Detected ng/mg creat   Desalkylflurazepam Not Detected ng/mg creat   Lorazepam Not Detected ng/mg creat   Alpha-hydroxytriazolam Not Detected ng/mg creat   Clonazepam Not Detected ng/mg creat   7-aminoclonazepam Not Detected ng/mg creat   Midazolam Not Detected ng/mg creat   Alpha-hydroxymidazolam 56 ng/mg creat   Flunitrazepam Not Detected ng/mg creat   Desmethylflunitrazepam Not Detected ng/mg creat   COCAINE METABOLITE IA Negative CUTOFF:150 ng/mL   ETHYL ALCOHOL Enzymatic Negative CUTOFF:0.020 g/dL   ETHANOL BIOMARKERS IA Negative CUTOFF:500 ng/mL   CANNABINOIDS IA Negative CUTOFF:20 ng/mL   6-ACETYLMORPHINE IA Negative CUTOFF:10 ng/mL   OPIATE CLASS IA Negative CUTOFF:100 ng/mL   OXYCODONE CLASS IA Negative CUTOFF:100 ng/mL   METHADONE IA Negative CUTOFF:100 ng/mL   METHADONE MTB IA Negative CUTOFF:100 ng/mL   BUPRENORPHINE Negative    Buprenorphine Not Detected ng/mg creat   Norbuprenorphine Not Detected ng/mg creat   FENTANYL Negative    Fentanyl Not Detected ng/mg creat   Norfentanyl Not Detected ng/mg creat   TAPENTADOL, IA Negative CUTOFF:200 ng/mL   MEPERIDINE IA Negative CUTOFF:200 ng/mL   PROPOXYPHENE IA Negative CUTOFF:300 ng/mL   TRAMADOL IA Negative CUTOFF:200 ng/mL   BARBITURATES IA Negative CUTOFF:200 ng/mL   OTHER HALLUCINOGENS Negative    Ketamine Not Detected    Norketamine Not Detected    PHENCYCLIDINE IA Negative CUTOFF:25 ng/mL   GABAPENTIN, IA Negative CUTOFF:1.0 ug/mL   CARISOPRODOL IA Negative CUTOFF:100 ng/mL   SEDATIVE/HYPNOTICS Negative    Zolpidem Not Detected    Zolpidem Acid Not Detected    Zopiclone/Eszopiclone Not Detected    Amino Chloropyridine Not  Detected    Zaleplon Not Detected     Comment: Expected metabolism of Sedatives/Hypnotics:   Parent Drug                 Detected Metabolites  -----------                 --------------------  Zolpidem:                    Zolpidem Acid  Zopiclone/Eszopiclone:      Amino Chloropyridine  Zaleplon:                   None    Acetaminophen Screen Negative CUTOFF:5.0 ug/mL   MISCELLANEOUS Negative    Dextromethorphan Not Detected    Dextrorphan/Levorphanol Not Detected     Comment: Expected metabolism of Dextromethorphan and Dextrorphan/Levorphanol:   Parent Drug                 Detected Metabolites  -----------                 --------------------  Dextromethorphan:           Dextrorphan  Dextrorphan/Levorphanol:    None   Dextrophan cannot be distinguished from Levorphanol   by the method used for analysis.         Garner Nash, MD, MS

## 2022-10-19 ENCOUNTER — Ambulatory Visit
Admission: RE | Admit: 2022-10-19 | Discharge: 2022-10-19 | Disposition: A | Discharge status: Home / Self Care | Payer: 59 | Source: Ambulatory Visit | Attending: Family Medicine | Admitting: Family Medicine

## 2022-10-19 DIAGNOSIS — R928 Other abnormal and inconclusive findings on diagnostic imaging of breast: Secondary | ICD-10-CM

## 2022-10-23 ENCOUNTER — Other Ambulatory Visit: Payer: Self-pay | Admitting: Family Medicine

## 2022-10-23 DIAGNOSIS — N6320 Unspecified lump in the left breast, unspecified quadrant: Secondary | ICD-10-CM

## 2022-10-24 ENCOUNTER — Ambulatory Visit
Admission: RE | Admit: 2022-10-24 | Discharge: 2022-10-24 | Disposition: A | Discharge status: Home / Self Care | Payer: 59 | Source: Ambulatory Visit | Attending: Family Medicine | Admitting: Family Medicine

## 2022-10-24 DIAGNOSIS — N6320 Unspecified lump in the left breast, unspecified quadrant: Secondary | ICD-10-CM

## 2022-10-24 HISTORY — PX: BREAST BIOPSY: SHX20

## 2022-11-27 ENCOUNTER — Ambulatory Visit: Payer: 59 | Admitting: Family Medicine

## 2022-11-27 ENCOUNTER — Encounter: Payer: Self-pay | Admitting: Family Medicine

## 2022-11-27 VITALS — BP 134/82 | HR 76 | Temp 97.4°F | Wt 284.8 lb

## 2022-11-27 DIAGNOSIS — F419 Anxiety disorder, unspecified: Secondary | ICD-10-CM

## 2022-11-27 DIAGNOSIS — R1115 Cyclical vomiting syndrome unrelated to migraine: Secondary | ICD-10-CM

## 2022-11-27 DIAGNOSIS — G4733 Obstructive sleep apnea (adult) (pediatric): Secondary | ICD-10-CM

## 2022-11-27 DIAGNOSIS — R569 Unspecified convulsions: Secondary | ICD-10-CM

## 2022-11-27 DIAGNOSIS — E66813 Obesity, class 3: Secondary | ICD-10-CM | POA: Diagnosis not present

## 2022-11-27 DIAGNOSIS — Z6841 Body Mass Index (BMI) 40.0 and over, adult: Secondary | ICD-10-CM

## 2022-11-27 DIAGNOSIS — R55 Syncope and collapse: Secondary | ICD-10-CM | POA: Diagnosis not present

## 2022-11-27 DIAGNOSIS — F4321 Adjustment disorder with depressed mood: Secondary | ICD-10-CM

## 2022-11-27 NOTE — Progress Notes (Unsigned)
Assessment/Plan:   Problem List Items Addressed This Visit   None   There are no discontinued medications.  No follow-ups on file.    Subjective:   Encounter date: 11/27/2022  Judith Bentley is a 47 y.o. female who has Syncope; Anemia; Pneumonia due to COVID-19 virus; Class 2 obesity due to excess calories with body mass index (BMI) of 38.0 to 38.9 in adult; Cyclical vomiting syndrome; Exertional dyspnea; Cough; Postviral fatigue syndrome; Chest pain; Nausea; Apnea; PVC (premature ventricular contraction); Abdominal pain, chronic, left lower quadrant; Anxious appearance; Class 3 severe obesity with serious comorbidity and body mass index (BMI) of 40.0 to 44.9 in adult [facility]); Obstructive sleep apnea syndrome; Adjustment disorder with depressed mood; and Encounter for therapeutic drug monitoring on their problem list..   She  has a past medical history of Allergy, Anxious appearance (06/15/2022), COVID-19, Cyclical vomiting syndrome, Encounter for therapeutic drug monitoring (09/17/2022), GERD (gastroesophageal reflux disease), Hyperlipidemia (8/22?), Hypertension, and Sleep apnea..   She presents with chief complaint of Medical Management of Chronic Issues (F/u from ED visit vasovagal syncope. She's still fatigued and not able to sleep well at night. Can't see neurology until January. ) .   HPI:   ROS  Past Surgical History:  Procedure Laterality Date   BREAST BIOPSY Left 10/24/2022   MM LT BREAST BX W LOC DEV 1ST LESION IMAGE BX SPEC STEREO GUIDE 10/24/2022 [facility]   CESAREAN SECTION     CHOLECYSTECTOMY     TUBAL LIGATION  03/08/05    Outpatient Medications Prior to Visit  Medication Sig Dispense Refill   Multiple Vitamin (MULTIVITAMIN PO) Take by mouth.     omeprazole (PRILOSEC) 20 MG capsule Take 20 mg by mouth 2 (two) times daily before a meal.     prochlorperazine (COMPAZINE) 5 MG tablet Take 5 mg by mouth every 6 (six) hours as needed.     promethazine  (PHENERGAN) 25 MG tablet Take 1 tablet (25 mg total) by mouth every 8 (eight) hours as needed for nausea or vomiting. 90 tablet 0   buPROPion (WELLBUTRIN XL) 150 MG 24 hr tablet Take 1 tablet (150 mg total) by mouth daily. (Patient not taking: Reported on 11/27/2022) 90 tablet 0   furosemide (LASIX) 20 MG tablet Take 1 tablet (20 mg total) by mouth daily. (Patient taking differently: Take 20 mg by mouth daily as needed.) 30 tablet 3   naltrexone (DEPADE) 50 MG tablet Take 0.5 tablets (25 mg total) by mouth daily. (Patient not taking: Reported on 11/27/2022) 45 tablet 0   No facility-administered medications prior to visit.    Family History  Problem Relation Age of Onset   High Cholesterol Mother    COPD Mother    Depression Mother    Hyperlipidemia Mother    Hypertension Mother    Sleep apnea Mother    ADD / ADHD Mother    Anxiety disorder Mother    Obesity Mother    Pancreatic cancer Father    Cancer Father    Diabetes Mellitus II Maternal Grandfather    Breast cancer Cousin        maternal first cousin    Social History   Socioeconomic History   Marital status: Married    Spouse name: Not on file   Number of children: 6   Years of education: Not on file   Highest education level: 12th grade  Occupational History   Occupation: housewife  Tobacco Use   Smoking status: Never   Smokeless  tobacco: Never   Tobacco comments:    Never smoked  Vaping Use   Vaping status: Never Used  Substance and Sexual Activity   Alcohol use: Never   Drug use: Never   Sexual activity: Yes    Birth control/protection: Other-see comments    Comment: Tubes tied and suspected menopause  Other Topics Concern   Not on file  Social History Narrative   Not on file   Social Determinants of Health   Financial Resource Strain: High Risk (10/10/2022)   Overall Financial Resource Strain (CARDIA)    Difficulty of Paying Living Expenses: Hard  Food Insecurity: Food Insecurity Present (10/10/2022)    Hunger Vital Sign    Worried About Running Out of Food in the Last Year: Sometimes true    Ran Out of Food in the Last Year: Never true  Transportation Needs: No Transportation Needs (10/10/2022)   PRAPARE - Administrator, Civil Service (Medical): No    Lack of Transportation (Non-Medical): No  Physical Activity: Unknown (10/10/2022)   Exercise Vital Sign    Days of Exercise per Week: 0 days    Minutes of Exercise per Session: Not on file  Stress: Stress Concern Present (10/10/2022)   [employer] of Occupational Health - Occupational Stress Questionnaire    Feeling of Stress : To some extent  Social Connections: Socially Integrated (10/10/2022)   Social Connection and Isolation Panel [NHANES]    Frequency of Communication with Friends and Family: More than three times a week    Frequency of Social Gatherings with Friends and Family: More than three times a week    Attends Religious Services: More than 4 times per year    Active Member of [employer] or Organizations: Yes    Attends Engineer, structural: More than 4 times per year    Marital Status: Married  Catering manager Violence: Not on file                                                                                                  Objective:  Physical Exam: BP 134/82 (BP Location: Right Arm, Patient Position: Sitting, Cuff Size: Large)   Pulse 76   Temp (!) 97.4 F (36.3 C) (Temporal)   Wt 284 lb 12.8 oz (129.2 kg)   SpO2 99%   BMI 42.06 kg/m   Wt Readings from Last 3 Encounters:  11/27/22 284 lb 12.8 oz (129.2 kg)  10/15/22 281 lb 12.8 oz (127.8 kg)  10/09/22 284 lb (128.8 kg)     Physical Exam  MM LT BREAST BX W LOC DEV 1ST LESION IMAGE BX SPEC STEREO GUIDE  Addendum Date: 10/25/2022   ADDENDUM REPORT: 10/25/2022 14:03 ADDENDUM: 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 This was found to be concordant by Dr. Quincy Carnes. Pathology results were discussed with the patient by telephone. The patient reported doing well after the biopsy with tenderness at the site. Post biopsy instructions and care were reviewed and questions were answered. The patient was encouraged to call [facility] for any additional concerns. The patient was instructed to return for annual screening mammography in August, 2025. Pathology results reported by Rene Kocher, RN on 10/25/2022. Electronically Signed   By: Hulan Saas M.D.   On: 10/25/2022 14:03   Result Date: 10/25/2022 CLINICAL DATA:  47 year old with a baseline screening detected indeterminate 1.3 cm mass or focal asymmetry in the UPPER OUTER QUADRANT of the LEFT breast at anterior depth without sonographic correlate. EXAM: LEFT BREAST STEREOTACTIC CORE NEEDLE BIOPSY 2D and 3D DIAGNOSTIC LEFT MAMMOGRAM POST BIOPSY COMPARISON:  Previous exam(s). FINDINGS: The patient and I discussed the procedure of stereotactic-guided biopsy including benefits and alternatives. We discussed the high likelihood of a successful procedure. We discussed the risks of the procedure including infection, bleeding, tissue injury, clip migration, and inadequate sampling. Informed written consent was given. The usual time out protocol was performed immediately prior to the procedure. Lesion quadrant: UPPER OUTER QUADRANT. Using sterile technique with chlorhexidine as skin antisepsis, 1% lidocaine and 1% lidocaine with epinephrine as local anesthetic, under stereotactic tomosynthesis guidance, a 9 gauge Brevera vacuum assisted device was used to perform core needle biopsy of the mass/focal asymmetry in the UPPER OUTER QUADRANT at anterior depth using a superior approach. Specimen radiograph was performed showing soft tissue density in multiple core samples, with the most mass-like soft tissue density in samples B, C and E. at the conclusion of the procedure, a ribbon shaped tissue marker  clip was deployed into the biopsy cavity. The patient tolerated the procedure well without immediate complications. Follow-up 2D and 3D full field CC and mediolateral mammographic images were obtained to confirm clip placement. The ribbon shaped tissue marking clip is appropriately positioned at the site of the biopsied mass in the UPPER OUTER QUADRANT at anterior depth. Expected post biopsy changes are present without evidence of hematoma. IMPRESSION: 1. Stereotactic tomosynthesis guided core needle biopsy of an indeterminate 1.3 cm mass/focal asymmetry in the UPPER OUTER QUADRANT of the LEFT breast at anterior depth. 2. Appropriate positioning of the ribbon shaped tissue marking clip at the site of the biopsied mass in the UPPER OUTER QUADRANT at anterior depth. Electronically Signed: By: Hulan Saas M.D. 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 This was found to be concordant by Dr. Quincy Carnes. Pathology results were discussed with the patient by telephone. The patient reported doing well after the biopsy with tenderness at the site. Post biopsy instructions and care were reviewed and questions were answered. The patient was encouraged to call [facility] for any additional concerns. The patient was instructed to return for annual screening mammography in August, 2025. Pathology results reported by Rene Kocher, RN on 10/25/2022. Electronically Signed   By: Hulan Saas M.D.   On: 10/25/2022 14:03   Result Date: 10/25/2022 CLINICAL DATA:  47 year old with a baseline screening detected indeterminate 1.3 cm mass or focal asymmetry in the UPPER OUTER QUADRANT of the LEFT breast at anterior depth without sonographic correlate. EXAM: LEFT BREAST STEREOTACTIC CORE NEEDLE BIOPSY 2D and 3D  DIAGNOSTIC LEFT MAMMOGRAM POST BIOPSY COMPARISON:  Previous exam(s). FINDINGS: The patient and I discussed the procedure of stereotactic-guided biopsy including benefits and alternatives. We discussed the high likelihood of a successful procedure. We discussed the risks of the procedure including infection, bleeding, tissue injury, clip  migration, and inadequate sampling. Informed written consent was given. The usual time out protocol was performed immediately prior to the procedure. Lesion quadrant: UPPER OUTER QUADRANT. Using sterile technique with chlorhexidine as skin antisepsis, 1% lidocaine and 1% lidocaine with epinephrine as local anesthetic, under stereotactic tomosynthesis guidance, a 9 gauge Brevera vacuum assisted device was used to perform core needle biopsy of the mass/focal asymmetry in the UPPER OUTER QUADRANT at anterior depth using a superior approach. Specimen radiograph was performed showing soft tissue density in multiple core samples, with the most mass-like soft tissue density in samples B, C and E. at the conclusion of the procedure, a ribbon shaped tissue marker clip was deployed into the biopsy cavity. The patient tolerated the procedure well without immediate complications. Follow-up 2D and 3D full field CC and mediolateral mammographic images were obtained to confirm clip placement. The ribbon shaped tissue marking clip is appropriately positioned at the site of the biopsied mass in the UPPER OUTER QUADRANT at anterior depth. Expected post biopsy changes are present without evidence of hematoma. IMPRESSION: 1. Stereotactic tomosynthesis guided core needle biopsy of an indeterminate 1.3 cm mass/focal asymmetry in the UPPER OUTER QUADRANT of the LEFT breast at anterior depth. 2. Appropriate positioning of the ribbon shaped tissue marking clip at the site of the biopsied mass in the UPPER OUTER QUADRANT at anterior depth. Electronically Signed: By: Hulan Saas M.D. On: 10/24/2022 11:44    MM 3D DIAGNOSTIC MAMMOGRAM UNILATERAL LEFT BREAST  Result Date: 10/19/2022 CLINICAL DATA:  47 year old female presenting as a recall from screening for possible left breast mass. EXAM: DIGITAL DIAGNOSTIC UNILATERAL LEFT MAMMOGRAM WITH TOMOSYNTHESIS AND CAD; ULTRASOUND LEFT BREAST LIMITED TECHNIQUE: Left digital diagnostic mammography and breast tomosynthesis was performed. The images were evaluated with computer-aided detection. ; Targeted ultrasound examination of the left breast was performed. COMPARISON:  Previous exam(s). ACR Breast Density Category b: There are scattered areas of fibroglandular density. FINDINGS: Mammogram: Spot compression tomosynthesis views of the left breast were performed in addition to standard views. There is a persistent elongated mass in the upper slightly outer left breast measuring 1.3 cm. Ultrasound: Targeted ultrasound is performed throughout the retroareolar aspect of the left breast demonstrating a few mildly a tack anechoic ducts. There is no definite correlate identified to the mass identified mammographically. IMPRESSION: Indeterminate mass measuring 1.3 cm without definite sonographic correlate in the upper outer left breast. RECOMMENDATION: Stereotactic core needle biopsy x1 of the left breast. I have discussed the findings and recommendations with the patient. If applicable, a reminder letter will be sent to the patient regarding the next appointment. BI-RADS CATEGORY  4: Suspicious. Electronically Signed   By: Emmaline Kluver M.D.   On: 10/19/2022 15:59   [place] LIMITED ULTRASOUND INCLUDING AXILLA LEFT BREAST   Result Date: 10/19/2022 CLINICAL DATA:  47 year old female presenting as a recall from screening for possible left breast mass. EXAM: DIGITAL DIAGNOSTIC UNILATERAL LEFT MAMMOGRAM WITH TOMOSYNTHESIS AND CAD; ULTRASOUND LEFT BREAST LIMITED TECHNIQUE: Left digital diagnostic mammography and breast tomosynthesis was performed. The images were evaluated with  computer-aided detection. ; Targeted ultrasound examination of the left breast was performed. COMPARISON:  Previous exam(s). ACR Breast Density Category b: There are scattered areas of fibroglandular density. FINDINGS: Mammogram: Spot compression tomosynthesis views of the left breast were performed in addition to standard views. There is a persistent elongated mass in the upper slightly outer left breast measuring 1.3 cm. Ultrasound: Targeted ultrasound is performed throughout the retroareolar aspect of the left breast demonstrating  a few mildly a tack anechoic ducts. There is no definite correlate identified to the mass identified mammographically. IMPRESSION: Indeterminate mass measuring 1.3 cm without definite sonographic correlate in the upper outer left breast. RECOMMENDATION: Stereotactic core needle biopsy x1 of the left breast. I have discussed the findings and recommendations with the patient. If applicable, a reminder letter will be sent to the patient regarding the next appointment. BI-RADS CATEGORY  4: Suspicious. Electronically Signed   By: Emmaline Kluver M.D.   On: 10/19/2022 15:59   MM 3D SCREENING MAMMOGRAM BILATERAL BREAST  Result Date: 10/10/2022 CLINICAL DATA:  Screening. This is the patient's initial baseline mammogram. EXAM: DIGITAL SCREENING BILATERAL MAMMOGRAM WITH TOMOSYNTHESIS AND CAD TECHNIQUE: Bilateral screening digital craniocaudal and mediolateral oblique mammograms were obtained. Bilateral screening digital breast tomosynthesis was performed. The images were evaluated with computer-aided detection. COMPARISON:  None. ACR Breast Density Category a: The breasts are almost entirely fatty. FINDINGS: In the left breast, a possible mass warrants further evaluation. In the right breast, no findings suspicious for malignancy. IMPRESSION: Further evaluation is suggested for a possible mass in the left breast. RECOMMENDATION: Diagnostic mammogram and possibly ultrasound of the left  breast. (Code:[id number]) The patient will be contacted regarding the findings, and additional imaging will be scheduled. BI-RADS CATEGORY  0: Incomplete: Need additional imaging evaluation. Electronically Signed   By: Hulan Saas M.D.   On: 10/10/2022 12:25    Recent Results (from the past 2160 hour(s))  CBC with Differential     Status: Abnormal   Collection Time: 09/12/22 11:14 PM  Result Value Ref Range   WBC 10.5 4.0 - 10.5 K/uL   RBC 4.46 3.87 - 5.11 MIL/uL   Hemoglobin 11.9 (L) 12.0 - 15.0 g/dL   HCT 98.1 19.1 - 47.8 %   MCV 83.6 80.0 - 100.0 fL   MCH 26.7 26.0 - 34.0 pg   MCHC 31.9 30.0 - 36.0 g/dL   RDW 29.5 62.1 - 30.8 %   Platelets 263 150 - 400 K/uL   nRBC 0.0 0.0 - 0.2 %   Neutrophils Relative % 76 %   Neutro Abs 8.1 (H) 1.7 - 7.7 K/uL   Lymphocytes Relative 18 %   Lymphs Abs 1.9 0.7 - 4.0 K/uL   Monocytes Relative 3 %   Monocytes Absolute 0.4 0.1 - 1.0 K/uL   Eosinophils Relative 1 %   Eosinophils Absolute 0.1 0.0 - 0.5 K/uL   Basophils Relative 1 %   Basophils Absolute 0.1 0.0 - 0.1 K/uL   Immature Granulocytes 1 %   Abs Immature Granulocytes 0.07 0.00 - 0.07 K/uL    Comment: Performed at [facility], [address], [place], [place] [address]  Comprehensive metabolic panel     Status: Abnormal   Collection Time: 09/12/22 11:14 PM  Result Value Ref Range   Sodium 140 135 - 145 mmol/L   Potassium 3.5 3.5 - 5.1 mmol/L   Chloride 105 98 - 111 mmol/L   CO2 24 22 - 32 mmol/L   Glucose, Bld 131 (H) 70 - 99 mg/dL    Comment: Glucose reference range applies only to samples taken after fasting for at least 8 hours.   BUN 9 6 - 20 mg/dL   Creatinine, Ser 6.96 0.44 - 1.00 mg/dL   Calcium 9.0 8.9 - 29.5 mg/dL   Total Protein 7.2 6.5 - 8.1 g/dL   Albumin 3.9 3.5 - 5.0 g/dL   AST 24 15 - 41 U/L   ALT  35 0 - 44 U/L   Alkaline Phosphatase 89 38 - 126 U/L   Total Bilirubin 0.4 0.3 - 1.2 mg/dL   GFR, Estimated >96 >29 mL/min    Comment: (NOTE) Calculated using  the CKD-EPI Creatinine Equation (2021)    Anion gap 11 5 - 15    Comment: Performed at [facility], [address] [place], [place] [address]  Lipase, blood     Status: None   Collection Time: 09/12/22 11:14 PM  Result Value Ref Range   Lipase 46 11 - 51 U/L    Comment: Performed at [facility], [address] [place], [place] [address]  HM COLONOSCOPY     Status: None   Collection Time: 09/13/22 12:00 AM  Result Value Ref Range   HM Colonoscopy See Report (in chart) See Report (in chart), Patient Reported    Comment: [facility]  Urinalysis, Routine w reflex microscopic -Urine, Clean Catch     Status: Abnormal   Collection Time: 09/13/22  1:08 AM  Result Value Ref Range   Color, Urine STRAW (A) YELLOW   APPearance CLEAR CLEAR   Specific Gravity, Urine 1.005 1.005 - 1.030   pH 5.0 5.0 - 8.0   Glucose, UA NEGATIVE NEGATIVE mg/dL   Hgb urine dipstick NEGATIVE NEGATIVE   Bilirubin Urine NEGATIVE NEGATIVE   Ketones, ur NEGATIVE NEGATIVE mg/dL   Protein, ur NEGATIVE NEGATIVE mg/dL   Nitrite NEGATIVE NEGATIVE   Leukocytes,Ua NEGATIVE NEGATIVE    Comment: Performed at [facility], [address], [place], [place] [address]  ToxAssure Flex 23, Ur     Status: None   Collection Time: 09/17/22 10:18 AM  Result Value Ref Range   Prescribed Drug 2 FINAL     Comment: ==================================================================== ToxAssure Flex 23, Ur ==================================================================== Test                             Result       Flag       Units  Drug Present   Alpha-hydroxymidazolam         56                      ng/mg creat    Alpha-hydroxymidazolam is an expected metabolite of midazolam.    Source of midazolam is a scheduled prescription medication.  ==================================================================== Test                      Result    Flag   Units      Ref Range   Creatinine              62                mg/dL      >=53 ==================================================================== Declared Medications:  Medication list was not provided. ==================================================================== For clinical consultation, please call [phone]. ====================================================================    Creatinine 62 mg/dL    Comment: REFERENCE RANGE: Ref Range>=20   AMPHETAMINES IA Negative CUTOFF:300 ng/mL   Benzodiazepines +POSITIVE+    Diazepam Not Detected ng/mg creat   Desmethyldiazepam Not Detected ng/mg creat   Oxazepam Not Detected ng/mg creat   Temazepam Not Detected ng/mg creat    Comment: Expected metabolism of benzodiazepine class drugs:   Parent Drug       Detected Metabolites  -----------       --------------------  Diazepam:  Desmethyldiazepam, Temazepam, Oxazepam  Chlordiazepoxide: Desmethyldiazepam, Oxazepam  Clorazepate:      Desmethyldiazepam, Oxazepam  Halazepam:        Desmethyldiazepam, Oxazepam  Temazepam:        Oxazepam  Oxazepam:         None    Alprazolam Not Detected ng/mg creat   Alpha-hydroxyalprazolam Not Detected ng/mg creat   Desalkylflurazepam Not Detected ng/mg creat   Lorazepam Not Detected ng/mg creat   Alpha-hydroxytriazolam Not Detected ng/mg creat   Clonazepam Not Detected ng/mg creat   7-aminoclonazepam Not Detected ng/mg creat   Midazolam Not Detected ng/mg creat   Alpha-hydroxymidazolam 56 ng/mg creat   Flunitrazepam Not Detected ng/mg creat   Desmethylflunitrazepam Not Detected ng/mg creat   COCAINE METABOLITE IA Negative CUTOFF:150 ng/mL   ETHYL ALCOHOL Enzymatic Negative CUTOFF:0.020 g/dL   ETHANOL BIOMARKERS IA Negative CUTOFF:500 ng/mL   CANNABINOIDS IA Negative CUTOFF:20 ng/mL   6-ACETYLMORPHINE IA Negative CUTOFF:10 ng/mL   OPIATE CLASS IA Negative CUTOFF:100 ng/mL   OXYCODONE CLASS IA Negative CUTOFF:100 ng/mL   METHADONE IA Negative CUTOFF:100 ng/mL   METHADONE MTB IA  Negative CUTOFF:100 ng/mL   BUPRENORPHINE Negative    Buprenorphine Not Detected ng/mg creat   Norbuprenorphine Not Detected ng/mg creat   FENTANYL Negative    Fentanyl Not Detected ng/mg creat   Norfentanyl Not Detected ng/mg creat   TAPENTADOL, IA Negative CUTOFF:200 ng/mL   MEPERIDINE IA Negative CUTOFF:200 ng/mL   PROPOXYPHENE IA Negative CUTOFF:300 ng/mL   TRAMADOL IA Negative CUTOFF:200 ng/mL   BARBITURATES IA Negative CUTOFF:200 ng/mL   OTHER HALLUCINOGENS Negative    Ketamine Not Detected    Norketamine Not Detected    PHENCYCLIDINE IA Negative CUTOFF:25 ng/mL   GABAPENTIN, IA Negative CUTOFF:1.0 ug/mL   CARISOPRODOL IA Negative CUTOFF:100 ng/mL   SEDATIVE/HYPNOTICS Negative    Zolpidem Not Detected    Zolpidem Acid Not Detected    Zopiclone/Eszopiclone Not Detected    Amino Chloropyridine Not Detected    Zaleplon Not Detected     Comment: Expected metabolism of Sedatives/Hypnotics:   Parent Drug                 Detected Metabolites  -----------                 --------------------  Zolpidem:                   Zolpidem Acid  Zopiclone/Eszopiclone:      Amino Chloropyridine  Zaleplon:                   None    Acetaminophen Screen Negative CUTOFF:5.0 ug/mL   MISCELLANEOUS Negative    Dextromethorphan Not Detected    Dextrorphan/Levorphanol Not Detected     Comment: Expected metabolism of Dextromethorphan and Dextrorphan/Levorphanol:   Parent Drug                 Detected Metabolites  -----------                 --------------------  Dextromethorphan:           Dextrorphan  Dextrorphan/Levorphanol:    None   Dextrophan cannot be distinguished from Levorphanol   by the method used for analysis.         Garner Nash, MD, MS

## 2022-11-29 ENCOUNTER — Encounter: Payer: Self-pay | Admitting: Family Medicine

## 2022-11-29 DIAGNOSIS — R569 Unspecified convulsions: Secondary | ICD-10-CM | POA: Insufficient documentation

## 2022-11-29 HISTORY — DX: Unspecified convulsions: R56.9

## 2022-11-29 NOTE — Assessment & Plan Note (Signed)
The patient is experiencing syncope with associated seizures and severe nausea.  Differential diagnosis:  Seizure disorder: Needs evaluation by neurology. Cardiogenic syncope: Cardiac workup previously normal. Vasovagal syncope triggered by vomiting.  Plan:  Continue using anti-nausea medications (Promethazine, Prochlorperazine, ondansetron) as needed. Follow-up with neurology appointment for seizure workup (appointment on January 27). Sleep medicine follow-up for alternative sleep apnea management due to CPAP concerns of aspiration Follow-up with Gastroenterology for gastric emptying study. Refer to a dietitian for weight management. Consider therapy for stress management.

## 2022-12-10 ENCOUNTER — Ambulatory Visit: Payer: 59 | Admitting: Dietician

## 2023-01-15 ENCOUNTER — Telehealth: Payer: 59 | Admitting: Family Medicine

## 2023-03-07 ENCOUNTER — Encounter: Payer: Self-pay | Admitting: *Deleted

## 2023-03-11 ENCOUNTER — Encounter: Payer: Self-pay | Admitting: Neurology

## 2023-03-11 ENCOUNTER — Ambulatory Visit: Payer: 59 | Admitting: Neurology

## 2023-03-11 VITALS — BP 153/79 | HR 96 | Ht 69.0 in | Wt 293.6 lb

## 2023-03-11 DIAGNOSIS — G4733 Obstructive sleep apnea (adult) (pediatric): Secondary | ICD-10-CM | POA: Diagnosis not present

## 2023-03-11 DIAGNOSIS — R112 Nausea with vomiting, unspecified: Secondary | ICD-10-CM | POA: Diagnosis not present

## 2023-03-11 DIAGNOSIS — R6889 Other general symptoms and signs: Secondary | ICD-10-CM

## 2023-03-11 DIAGNOSIS — R55 Syncope and collapse: Secondary | ICD-10-CM | POA: Diagnosis not present

## 2023-03-11 DIAGNOSIS — H539 Unspecified visual disturbance: Secondary | ICD-10-CM

## 2023-03-11 NOTE — Progress Notes (Signed)
Subjective:    Patient ID: Judith Bentley is a 48 y.o. female.  HPI    Judith Foley, MD, PhD [facility] [address], [address] [address] [place], [place] [address]  Dear Dr. Jennye Boroughs,  I saw your patient, Judith Bentley, upon your kind request in my neurologic clinic today for evaluation of her syncope.  The patient is accompanied by her husband today.  As you know, [name] is a 48 year old female with an underlying medical history of obstructive sleep apnea, on AutoPap therapy, recurrent syncope, nausea and vomiting, hypertension, hyperlipidemia, reflux disease, allergies, anemia, and severe obesity with a BMI of over 40, who reports recurrent syncopal spells since 2021.  Her husband supplements her history and reports that since she had COVID in 2021 she has had cyclical nausea and vomiting, at times intractable.  She has had intermittent syncopal spells.  She feels that while her spells are not more frequent, they may have become worse in intensity.  She has associated eye rolling, jaw clenching, and at times twitching, loses awareness.  Many years ago she had seen a neurologist to investigated her for multiple sclerosis as this was a concern based on a brain scan in 2012 as I understand.  She also had evaluation for stroke in the past. I reviewed your office note from 11/19/2022.  She takes Phenergan or Compazine for nausea and vomiting.  She is currently not on Wellbutrin.  It was prescribed for weight loss for her but she never actually took it. She does not always hydrate well.  Per husband, she drinks up to 1 or 2 bottles of water per day.  She drinks tea occasionally, she drinks soda occasionally, she drinks coffee rarely.  She does not drink any alcohol.  She is a non-smoker.  She takes Phenergan about once a day on average.  She is compliant with her CPAP, she has tried a fullface mask and the nasal mask and would prefer the nasal mask but  currently only has a fullface mask.  I reviewed her AutoPap compliance data from the past 30 days, she has been on it consistently and has an average usage of 9 hours and 39 minutes, residual AHI at goal at 0.8/h, 95th percentile pressure 12.7 cm with a range of 6 to 14 cm with EPR of 3.  She reports an episode of syncope when she was on vacation in [place].  She was taken to the emergency room in September 2024 but did not get admitted as I understand.    Reports tiredness after vomiting.  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  She had a head CT without contrast through [facility] with indication of mental status change, no reported injury, on 11/29/2019, and I reviewed the results:  IMPRESSION: 1. No evidence of acute intracranial abnormality. 2. Mild chronic appearing paranasal sinusitis.     In addition, I personally and independently reviewed images through the PACS system.   She has been followed in our clinic for sleep apnea.  She was originally referred by cardiology.  She has been seeing cardiology for PVCs, tachycardia, and exertional dyspnea.    She was last seen in this clinic by Ihor Austin, NP in August 2024.  She was seen in the emergency room at [facility] in July 2024 for nausea and vomiting and also presented to the emergency room at [facility] with nausea and vomiting and diarrhea in September 2023.  She was admitted to the hospital in May 2022 for syncope and chest pain.  Has bifocal eyeglasses but has not had her prescription rechecked in 3 to 4 years.  She has not had an updated eye exam in years.  Previously (copied from previous notes for reference):    10/09/2022 Ihor Austin, NP): <<Yumiko Speegle is a 48 y.o. female who was initially seen by Dr. Frances Furbish on 07/10/2022 for concern of underlying sleep apnea with reported snoring, excessive daytime somnolence, occasional morning headaches and witnessed  apneas.  ESS 11/24. FSS 63/63.  HST 07/31/2022 showed severe OSA with total AHI of 48.1/h and O2 nadir of 76% with time below or at 88% saturation of over 50 minutes indicating nocturnal hypoxemia.  AutoPap initiated 08/21/2022.   Interval history:   CPAP compliance report shows satisfactory usage and optimal residual AHI.  Initially using FFM but difficulty tolerating.  Recently switched to nasal pillow, initial nasal pillow mask too small, received new mask last week and feels like she is tolerating her new mask better. Still has fatigue but feels this is due to not sleeping well due to mask intolerance.  Headaches have been gradually improving over the past week. ESS 10/24 (prior 11/24). FSS 60/63 (prior 63/63).  >>  07/10/2022: 48 year old female with an underlying medical history of palpitations with PVCs, hyperlipidemia, hypertension, allergies, reflux disease, cyclical vomiting syndrome, history of syncope, dyspnea on exertion, leg swelling, and severe obesity with a BMI of over 40, who reports snoring and excessive daytime somnolence as well as witnessed apneas.  Her Epworth sleepiness score is 11 out of 24, fatigue severity score is 63 out of 63.  I reviewed your office note from 06/11/2022.  She has occasional morning headaches.  She has nocturia about 2-4 times per average night.  She has 6 daughters, 2 of them live at home.  She is expecting her third grandchild, she already has 2 granddaughters and her third granddaughter was due in August.  She does not currently work.  Her bedtime and rise time are variable.  She may be in bed somewhere between 7 and 10 PM and rise time generally is between 7 and 7:30 AM.  They have 1 dog in the household and the dog often sleeps in the bed with them.  She has a TV on in her bedroom, her husband often likes to have it on to fall asleep to.  Sometimes she turns it off in the middle of the night.  She has gained quite a bit of weight in the past 12 to 14 months, in the  realm of 30 pounds.  She is working on weight loss.  She is a non-smoker and does not drink any alcohol.  She drinks no daily caffeine, occasional tea.  Her mom has sleep apnea and has a CPAP machine.  She has woken up with a sense of gasping for air and difficulty breathing, especially when she sleeps on her back.  She often wakes up with a severely dry mouth.     Her Past Medical History Is Significant For: Past Medical History:  Diagnosis Date   Allergy    Seasonal allergies   Anemia    Anxious appearance 06/15/2022   COVID-19    Cyclical vomiting syndrome    Encounter for therapeutic drug monitoring 09/17/2022   GERD (gastroesophageal reflux disease)    Hyperlipidemia 8/22?   Hypertension    Seizure-like activity (HCC) 11/29/2022   Sleep apnea    I have  not been diagnosed, but have symptoms    Her Past Surgical History Is Significant For: Past Surgical History:  Procedure Laterality Date   BREAST BIOPSY Left 10/24/2022   MM LT BREAST BX W LOC DEV 1ST LESION IMAGE BX SPEC STEREO GUIDE 10/24/2022 [facility]   CESAREAN SECTION     CHOLECYSTECTOMY     TUBAL LIGATION  03/08/05    Her Family History Is Significant For: Family History  Problem Relation Age of Onset   High Cholesterol Mother    COPD Mother    Depression Mother    Hyperlipidemia Mother    Hypertension Mother    Sleep apnea Mother    ADD / ADHD Mother    Anxiety disorder Mother    Obesity Mother    Pancreatic cancer Father    Cancer Father        pancreatic   Diabetes Mellitus II Maternal Grandfather    Breast cancer Cousin        maternal first cousin    Her Social History Is Significant For: Social History   Socioeconomic History   Marital status: Married    Spouse name: Not on file   Number of children: 6   Years of education: Not on file   Highest education level: 12th grade  Occupational History   Occupation: housewife  Tobacco Use   Smoking status: Never   Smokeless tobacco: Never    Tobacco comments:    Never smoked  Vaping Use   Vaping status: Never Used  Substance and Sexual Activity   Alcohol use: Never   Drug use: Never   Sexual activity: Yes    Birth control/protection: Other-see comments    Comment: Tubes tied and suspected menopause  Other Topics Concern   Not on file  Social History Narrative   Not on file   Social Drivers of Health   Financial Resource Strain: High Risk (10/10/2022)   Overall Financial Resource Strain (CARDIA)    Difficulty of Paying Living Expenses: Hard  Food Insecurity: Food Insecurity Present (10/10/2022)   Hunger Vital Sign    Worried About Running Out of Food in the Last Year: Sometimes true    Ran Out of Food in the Last Year: Never true  Transportation Needs: No Transportation Needs (10/10/2022)   PRAPARE - Administrator, Civil Service (Medical): No    Lack of Transportation (Non-Medical): No  Physical Activity: Unknown (10/10/2022)   Exercise Vital Sign    Days of Exercise per Week: 0 days    Minutes of Exercise per Session: Not on file  Stress: Stress Concern Present (10/10/2022)   [employer] of Occupational Health - Occupational Stress Questionnaire    Feeling of Stress : To some extent  Social Connections: Socially Integrated (10/10/2022)   Social Connection and Isolation Panel [NHANES]    Frequency of Communication with Friends and Family: More than three times a week    Frequency of Social Gatherings with Friends and Family: More than three times a week    Attends Religious Services: More than 4 times per year    Active Member of [employer] or Organizations: Yes    Attends Engineer, structural: More than 4 times per year    Marital Status: Married    Her Allergies Are:  Allergies  Allergen Reactions   Codeine Nausea And Vomiting  :   Her Current Medications Are:  Outpatient Encounter Medications as of 03/11/2023  Medication Sig   Multiple Vitamin (MULTIVITAMIN  PO) Take by mouth.    omeprazole (PRILOSEC) 20 MG capsule Take 20 mg by mouth 2 (two) times daily before a meal.   prochlorperazine (COMPAZINE) 5 MG tablet Take 5 mg by mouth every 6 (six) hours as needed.   promethazine (PHENERGAN) 25 MG tablet Take 1 tablet (25 mg total) by mouth every 8 (eight) hours as needed for nausea or vomiting.   furosemide (LASIX) 20 MG tablet Take 1 tablet (20 mg total) by mouth daily. (Patient not taking: Reported on 03/11/2023)   No facility-administered encounter medications on file as of 03/11/2023.  :   Review of Systems:  Out of a complete 14 point review of systems, all are reviewed and negative with the exception of these symptoms as listed below:  Review of Systems  Neurological:        Pt here for syncope.  Being since after got covid. 2022, last episode 9.2024.  (pal, diaphoritec, talks gibberish, ,eyes roll back.  Had seen another Neurologist in [place], ? Diag MS 2012.     Objective:  Neurological Exam  Physical Exam Physical Examination:   Vitals:   03/11/23 1429  BP: (!) 153/79  Pulse: 96    General Examination: The patient is a very pleasant 48 y.o. female in no acute distress. She appears well-developed and well-nourished and well groomed.   HEENT: Normocephalic, atraumatic, pupils are equal, round and reactive to light, no photophobia.  Funduscopic exam benign.  Extraocular tracking is good without limitation to gaze excursion or nystagmus noted. Hearing is grossly intact. Face is symmetric with normal facial animation. Speech is clear with no dysarthria noted. There is no hypophonia. There is no lip, neck/head, jaw or voice tremor. Neck is supple with full range of passive and active motion. There are no carotid bruits on auscultation. Oropharynx exam reveals: mild mouth dryness, good dental hygiene and moderate airway crowding.  Tongue protrudes centrally and palate elevates symmetrically.   Chest: Clear to auscultation without wheezing, rhonchi or crackles  noted.   Heart: S1+S2+0, regular and normal without murmurs, rubs or gallops noted.    Abdomen: Soft, non-tender and non-distended.   Extremities: There is no pitting edema in the distal lower extremities bilaterally.    Skin: Warm and dry without trophic changes noted.    Musculoskeletal: exam reveals no obvious joint deformities.    Neurologically:  Mental status: The patient is awake, alert and oriented in all 4 spheres. Her immediate and remote memory, attention, language skills and fund of knowledge are appropriate. There is no evidence of aphasia, agnosia, apraxia or anomia. Speech is clear with normal prosody and enunciation. Thought process is linear. Mood is normal and affect is normal.  Cranial nerves II - XII are as described above under HEENT exam.  Motor exam: Normal bulk, strength and tone is noted. There is no obvious action or resting tremor.  No drift or rebound. Romberg negative. Fine motor skills and coordination: intact finger taps, hand movements and rapid alternating patting with both upper extremities, normal foot taps bilaterally in the lower extremities.  Cerebellar testing: No dysmetria or intention tremor. There is no truncal or gait ataxia.  Normal finger-to-nose, normal heel-to-shin bilaterally with the exception of some difficulty with range of motion in the hips. Reflexes 1+ throughout, toes are downgoing bilaterally. Sensory exam: intact to light touch in the upper and lower extremities.  Gait, station and balance: She stands easily. No veering to one side is noted. No leaning to one side is  noted. Posture is age-appropriate and stance is narrow based. Gait shows normal stride length and normal pace. No problems turning are noted.  Normal tandem walk.   Assessment and Plan:  In summary, Kevon Arman is a very pleasant 48 year old female with an underlying medical history of obstructive sleep apnea, on AutoPap therapy, recurrent syncope, nausea and  vomiting, hypertension, hyperlipidemia, reflux disease, allergies, anemia, and severe obesity with a BMI of over 40, who presents for evaluation of her recurrent syncope of approximately 3 years duration.  She has associated severe nausea and vomiting.  She had GI workup and cardiology workup.  Neurological exam is nonfocal.  I recommend we proceed with a brain MRI with and without contrast and an EEG through our office.  We talked about different triggers for syncope.  Description of the event is not classic for seizures.  Nevertheless, I do agree that she should avoid medications that could lower seizure threshold such as Wellbutrin.  She is reminded to stay better hydrated with water and achieve 64 ounces of water per day.  She is advised to limit her caffeine to 1 or 2 servings per day and be consistent with her AutoPap as she is.  She is encouraged to talk to her DME provider about a nasal mask as she likes it better.  We will keep her posted as to her brain MRI results and EEG results by phone call and plan follow-up accordingly in this office.  She is advised to get an updated full eye exam as she has not seen an eye doctor in about 3 years.  She has prescription eyeglasses but does not use them all the time.  I answered all the questions today and the patient and her husband were in agreement with our approach.   I spent 60 minutes in total face-to-face time and in reviewing records during pre-charting, more than 50% of which was spent in counseling and coordination of care, reviewing test results, reviewing medications and treatment regimen and/or in discussing or reviewing the diagnosis of recurrent syncope, the prognosis and treatment options. Pertinent laboratory and imaging test results that were available during this visit with the patient were reviewed by me and considered in my medical decision making (see chart for details).

## 2023-03-11 NOTE — Patient Instructions (Signed)
We will do an EEG (brainwave test), which we will schedule. We will call you with the results.  We will do a brain scan, called MRI and call you with the test results. We will have to schedule you for this on a separate date. This test requires authorization from your insurance, and we will take care of the insurance process.

## 2023-03-13 ENCOUNTER — Other Ambulatory Visit: Payer: Self-pay | Admitting: Family Medicine

## 2023-03-13 DIAGNOSIS — R1115 Cyclical vomiting syndrome unrelated to migraine: Secondary | ICD-10-CM

## 2023-03-13 NOTE — Telephone Encounter (Signed)
Copied from CRM [id number]. Topic: Clinical - Medication Refill >> Mar 13, 2023  4:51 PM Florestine Avers wrote: Most Recent Primary Care Visit:  Provider: Garnette Gunner  Department: [facility]  Visit Type: OFFICE VISIT  Date: 11/27/2022  Medication: promethazine (PHENERGAN) 25 MG tablet   Has the patient contacted their pharmacy? Yes (Agent: If no, request that the patient contact the pharmacy for the refill. If patient does not wish to contact the pharmacy document the reason why and proceed with request.) (Agent: If yes, when and what did the pharmacy advise?)  Is this the correct pharmacy for this prescription? Yes If no, delete pharmacy and type the correct one.  This is the patient's preferred pharmacy:  [facility] [address] [place] - [address] [facility] [place] [address] Phone: [phone] Fax: [phone]   Has the prescription been filled recently? Yes  Is the patient out of the medication? Yes  Has the patient been seen for an appointment in the last year OR does the patient have an upcoming appointment? Yes  Can we respond through MyChart? Yes  Agent: Please be advised that Rx refills may take up to 3 business days. We ask that you follow-up with your pharmacy.

## 2023-03-14 ENCOUNTER — Telehealth: Payer: Self-pay | Admitting: Neurology

## 2023-03-14 ENCOUNTER — Other Ambulatory Visit: Payer: Self-pay | Admitting: Family Medicine

## 2023-03-14 DIAGNOSIS — R1115 Cyclical vomiting syndrome unrelated to migraine: Secondary | ICD-10-CM

## 2023-03-14 MED ORDER — PROMETHAZINE HCL 25 MG PO TABS
25.0000 mg | ORAL_TABLET | Freq: Three times a day (TID) | ORAL | 0 refills | Status: DC | PRN
Start: 2023-03-14 — End: 2023-06-27

## 2023-03-14 MED ORDER — ALPRAZOLAM 0.5 MG PO TABS: ORAL_TABLET | ORAL | 0 refills | Status: DC

## 2023-03-14 MED ORDER — PROMETHAZINE HCL 25 MG PO TABS
25.0000 mg | ORAL_TABLET | Freq: Three times a day (TID) | ORAL | 0 refills | Status: DC | PRN
Start: 2023-03-14 — End: 2023-03-14

## 2023-03-14 NOTE — Telephone Encounter (Signed)
Requesting: promethazine (PHENERGAN) 25 MG tablet  Last Visit: 11/27/2022 Next Visit: Visit date not found Last Refill: 10/15/2022  Please Advise

## 2023-03-14 NOTE — Telephone Encounter (Signed)
I have ordered Xanax for patient's upcoming MRI due to anxiety/claustrophobia reported. Please inform patient or caregiver and remind them, that she should not drive after taking Xanax and have someone take her to and from the MRI appointment.   

## 2023-03-14 NOTE — Telephone Encounter (Signed)
Patient is scheduled for MRI next week can you please send in Xanax for her thanks

## 2023-03-14 NOTE — Addendum Note (Signed)
Addended by: Huston Foley on: 03/14/2023 05:01 PM   Modules accepted: Orders

## 2023-03-15 NOTE — Telephone Encounter (Signed)
I called pt and relayed prescription for xanax was called to pharmacy and she should have a driver for  MRI. She verbalized understanding.

## 2023-03-19 ENCOUNTER — Ambulatory Visit (INDEPENDENT_AMBULATORY_CARE_PROVIDER_SITE_OTHER): Payer: 59

## 2023-03-19 DIAGNOSIS — H539 Unspecified visual disturbance: Secondary | ICD-10-CM | POA: Diagnosis not present

## 2023-03-19 DIAGNOSIS — R112 Nausea with vomiting, unspecified: Secondary | ICD-10-CM

## 2023-03-19 DIAGNOSIS — R6889 Other general symptoms and signs: Secondary | ICD-10-CM

## 2023-03-19 DIAGNOSIS — R55 Syncope and collapse: Secondary | ICD-10-CM | POA: Diagnosis not present

## 2023-03-19 DIAGNOSIS — G4733 Obstructive sleep apnea (adult) (pediatric): Secondary | ICD-10-CM | POA: Diagnosis not present

## 2023-03-19 MED ORDER — GADOBENATE DIMEGLUMINE 529 MG/ML IV SOLN
20.0000 mL | Freq: Once | INTRAVENOUS | Status: AC | PRN
  Administered 2023-03-19: 20 mL via INTRAVENOUS

## 2023-03-20 ENCOUNTER — Encounter: Payer: Self-pay | Admitting: Obstetrics & Gynecology

## 2023-03-20 ENCOUNTER — Ambulatory Visit: Payer: 59 | Admitting: Obstetrics & Gynecology

## 2023-03-20 ENCOUNTER — Other Ambulatory Visit (HOSPITAL_COMMUNITY)
Admission: RE | Admit: 2023-03-20 | Discharge: 2023-03-20 | Disposition: A | Discharge status: Home / Self Care | Payer: 59 | Source: Ambulatory Visit | Attending: Obstetrics & Gynecology | Admitting: Obstetrics & Gynecology

## 2023-03-20 VITALS — BP 149/88 | HR 86 | Ht 69.0 in | Wt 290.3 lb

## 2023-03-20 DIAGNOSIS — Z6841 Body Mass Index (BMI) 40.0 and over, adult: Secondary | ICD-10-CM

## 2023-03-20 DIAGNOSIS — Z01419 Encounter for gynecological examination (general) (routine) without abnormal findings: Secondary | ICD-10-CM | POA: Insufficient documentation

## 2023-03-20 DIAGNOSIS — E66813 Obesity, class 3: Secondary | ICD-10-CM

## 2023-03-20 NOTE — Progress Notes (Signed)
Pt presents for new GYN visit, to establish care. Pt has not had a PAP in a while. Pt states she had a mammogram this past year (July-September). Colonoscopy done in July 2024. Pain in left abdomen, been going on for years but has not had pain in a while. Pt had a tubal done and reports pain was severe then but slowly got better. Pt has been told she has fibroids.

## 2023-03-20 NOTE — Progress Notes (Signed)
GYNECOLOGY CLINIC ANNUAL PREVENTATIVE CARE ENCOUNTER NOTE  Subjective:   Judith Bentley is a 48 y.o. [phone] female here for a routine annual gynecologic exam.  Current complaints: none.   Denies abnormal vaginal bleeding, discharge, pelvic pain, problems with intercourse or other gynecologic concerns.    Gynecologic History No LMP recorded. (Menstrual status: Other). Contraception: post menopausal status, BTL Last Pap: 2-3 years. Results were: normal Last mammogram: 2024. Results were: abnormal  Obstetric History OB History  Gravida Para Term Preterm AB Living  6 6 6   6   SAB IAB Ectopic Multiple Live Births          # Outcome Date GA Lbr Len/2nd Weight Sex Type Anes PTL Lv  6 Term           5 Term           4 Term           3 Term           2 Term           1 Term             Past Medical History:  Diagnosis Date   Allergy    Seasonal allergies   Anemia    Anxious appearance 06/15/2022   COVID-19    Cyclical vomiting syndrome    Encounter for therapeutic drug monitoring 09/17/2022   GERD (gastroesophageal reflux disease)    Hyperlipidemia 8/22?   Hypertension    Seizure-like activity (HCC) 11/29/2022   Sleep apnea    I have not been diagnosed, but have symptoms    Past Surgical History:  Procedure Laterality Date   BREAST BIOPSY Left 10/24/2022   MM LT BREAST BX W LOC DEV 1ST LESION IMAGE BX SPEC STEREO GUIDE 10/24/2022 [facility]   CESAREAN SECTION     CHOLECYSTECTOMY     TUBAL LIGATION  03/08/05    Current Outpatient Medications on File Prior to Visit  Medication Sig Dispense Refill   Multiple Vitamin (MULTIVITAMIN PO) Take by mouth.     omeprazole (PRILOSEC) 20 MG capsule Take 20 mg by mouth 2 (two) times daily before a meal.     prochlorperazine (COMPAZINE) 5 MG tablet Take 5 mg by mouth every 6 (six) hours as needed.     promethazine (PHENERGAN) 25 MG tablet Take 1 tablet (25 mg total) by mouth every 8 (eight) hours as needed for nausea or  vomiting. 90 tablet 0   ALPRAZolam (XANAX) 0.5 MG tablet Take 1-2 pills as needed on call to MRI.  May take a 3rd pill if needed. (Patient not taking: Reported on 03/20/2023) 3 tablet 0   furosemide (LASIX) 20 MG tablet Take 1 tablet (20 mg total) by mouth daily. (Patient not taking: Reported on 03/11/2023) 30 tablet 3   No current facility-administered medications on file prior to visit.    Allergies  Allergen Reactions   Codeine Nausea And Vomiting    Social History   Socioeconomic History   Marital status: Married    Spouse name: Not on file   Number of children: 6   Years of education: Not on file   Highest education level: 12th grade  Occupational History   Occupation: housewife  Tobacco Use   Smoking status: Never   Smokeless tobacco: Never   Tobacco comments:    Never smoked  Vaping Use   Vaping status: Never Used  Substance and Sexual Activity   Alcohol use: Never   Drug use:  Never   Sexual activity: Yes    Birth control/protection: Other-see comments    Comment: Tubes tied and suspected menopause  Other Topics Concern   Not on file  Social History Narrative   Not on file   Social Drivers of Health   Financial Resource Strain: High Risk (10/10/2022)   Overall Financial Resource Strain (CARDIA)    Difficulty of Paying Living Expenses: Hard  Food Insecurity: Food Insecurity Present (10/10/2022)   Hunger Vital Sign    Worried About Running Out of Food in the Last Year: Sometimes true    Ran Out of Food in the Last Year: Never true  Transportation Needs: No Transportation Needs (10/10/2022)   PRAPARE - Administrator, Civil Service (Medical): No    Lack of Transportation (Non-Medical): No  Physical Activity: Unknown (10/10/2022)   Exercise Vital Sign    Days of Exercise per Week: 0 days    Minutes of Exercise per Session: Not on file  Stress: Stress Concern Present (10/10/2022)   [employer] of Occupational Health - Occupational Stress  Questionnaire    Feeling of Stress : To some extent  Social Connections: Socially Integrated (10/10/2022)   Social Connection and Isolation Panel [NHANES]    Frequency of Communication with Friends and Family: More than three times a week    Frequency of Social Gatherings with Friends and Family: More than three times a week    Attends Religious Services: More than 4 times per year    Active Member of [employer] or Organizations: Yes    Attends Engineer, structural: More than 4 times per year    Marital Status: Married  Catering manager Violence: Not on file    Family History  Problem Relation Age of Onset   High Cholesterol Mother    COPD Mother    Depression Mother    Hyperlipidemia Mother    Hypertension Mother    Sleep apnea Mother    ADD / ADHD Mother    Anxiety disorder Mother    Obesity Mother    Pancreatic cancer Father    Cancer Father        pancreatic   Diabetes Mellitus II Maternal Grandfather    Breast cancer Cousin        maternal first cousin    The following portions of the patient's history were reviewed and updated as appropriate: allergies, current medications, past family history, past medical history, past social history, past surgical history and problem list.  Review of Systems Gastrointestinal: negative Genitourinary:negative Musculoskeletal:negative   Objective:  BP (!) 149/88   Pulse 86   Ht 5\' 9"  (1.753 m)   Wt 290 lb 4.8 oz (131.7 kg)   BMI 42.87 kg/m  CONSTITUTIONAL: Well-developed, obese female in no acute distress.  HENT:  Normocephalic, atraumatic, External right and left ear normal. Oropharynx is clear and moist EYES: Conjunctivae and EOM are normal. Pupils are equal, round, and reactive to light. No scleral icterus.  NECK: Normal range of motion, supple, no masses.  Normal thyroid.  SKIN: Skin is warm and dry. No rash noted. Not diaphoretic. No erythema. No pallor. NEUROLGIC: Alert and oriented to person, place, and time. Normal  reflexes, muscle tone coordination. No cranial nerve deficit noted. PSYCHIATRIC: Normal mood and affect. Normal behavior. Normal judgment and thought content. CARDIOVASCULAR: Normal heart rate noted, regular rhythm RESPIRATORY: Clear to auscultation bilaterally. Effort and breath sounds normal, no problems with respiration noted. BREASTS: Deferred ABDOMEN: Soft, normal bowel sounds,  no distention noted.  No tenderness, rebound or guarding.  PELVIC: Normal appearing external genitalia; normal appearing vaginal mucosa and cervix.  No abnormal discharge noted.  Pap smear obtained.  Normal uterine size, no other palpable masses, no uterine or adnexal tenderness. MUSCULOSKELETAL: Normal range of motion. No tenderness.  No cyanosis, clubbing, or edema.    Assessment:  Annual gynecologic examination with pap smear   Plan:  Will follow up results of pap smear and manage accordingly. Mammogram f/u is already established following biopsy Routine preventative health maintenance measures emphasized, s/p colonoscopy Please refer to After Visit Summary for other counseling recommendations.    Scheryl Darter, MD Attending Obstetrician & Gynecologist Center for [employer], [facility]

## 2023-03-26 ENCOUNTER — Encounter: Payer: Self-pay | Admitting: Neurology

## 2023-03-26 LAB — CYTOLOGY - PAP
Adequacy: ABSENT
Comment: NEGATIVE
Diagnosis: NEGATIVE
High risk HPV: NEGATIVE

## 2023-04-01 ENCOUNTER — Telehealth: Payer: Self-pay | Admitting: *Deleted

## 2023-04-01 ENCOUNTER — Other Ambulatory Visit: Payer: 59 | Admitting: *Deleted

## 2023-04-01 NOTE — Telephone Encounter (Signed)
 Provider out

## 2023-04-16 ENCOUNTER — Ambulatory Visit: Payer: 59 | Admitting: Neurology

## 2023-04-16 DIAGNOSIS — R55 Syncope and collapse: Secondary | ICD-10-CM

## 2023-04-16 DIAGNOSIS — R9401 Abnormal electroencephalogram [EEG]: Secondary | ICD-10-CM

## 2023-04-16 DIAGNOSIS — R112 Nausea with vomiting, unspecified: Secondary | ICD-10-CM

## 2023-04-16 DIAGNOSIS — R6889 Other general symptoms and signs: Secondary | ICD-10-CM

## 2023-04-16 DIAGNOSIS — G4733 Obstructive sleep apnea (adult) (pediatric): Secondary | ICD-10-CM

## 2023-04-16 DIAGNOSIS — H539 Unspecified visual disturbance: Secondary | ICD-10-CM

## 2023-04-16 NOTE — Addendum Note (Signed)
 Addended by: Huston Foley on: 04/16/2023 04:44 PM   Modules accepted: Orders

## 2023-04-16 NOTE — Procedures (Signed)
    History:  49 year old woman with recurrent syncope   EEG classification: Awake and drowsy  Duration: 25 minutes   Technical aspects: This EEG study was done with scalp electrodes positioned according to the 10-20 International system of electrode placement. Electrical activity was reviewed with band pass filter of 1-70Hz , sensitivity of 7 uV/mm, display speed of 10mm/sec with a 60Hz  notched filter applied as appropriate. EEG data were recorded continuously and digitally stored.   Description of the recording: The background rhythms of this recording consists of a fairly well modulated medium amplitude alpha rhythm of 11 Hz that is reactive to eye opening and closure. Present in the anterior head region is a 15-20 Hz beta activity. Photic stimulation was performed, did not show any abnormalities. Hyperventilation was also performed, did not show any abnormalities. Drowsiness was manifested by background fragmentation. No definite abnormal epileptiform discharges seen during this recording. There was right mid temporal focal slowing with sharp contoured waves. There were no electrographic seizure identified.   Abnormality: Intermittent right mid temporal slowing with sharply contoured wave.    Impression: This is an abnormal awake and drowsy EEG due to presence of intermittent right mid temporal slowing with sharply contoured waves. This is associated with area of neuronal dysfunction in the right mid temporal region, consider ambulatory EEG for further evaluation.     Windell Norfolk, MD [facility]

## 2023-04-17 ENCOUNTER — Telehealth: Payer: Self-pay | Admitting: *Deleted

## 2023-04-17 NOTE — Telephone Encounter (Signed)
-----   Message from Huston Foley sent at 04/16/2023  4:44 PM EST ----- Please call and advise the patient (or Husband, on DPR), that the EEG or brain wave test we performed was reported as showing some abnormality on the right side, not clearly seizures but abnormalities, that can increase the possibility of seizure and may reflect electrical "irritability". In order to further delineate these findings, the reading physician (Dr. Teresa Coombs, our epileptologist) recommends a longer during EEG, which can be done at home, called 3-day ambulatory EEG. I would like to order this and proceed with a longer evaluation at home. If they are agreeable please fill out form for ambulatory extended EEG and submit. I will place the electronic order.   Thanks,  Huston Foley, MD, PhD

## 2023-04-17 NOTE — Telephone Encounter (Signed)
 I called the patient and discussed her EEG results as noted below by Dr Frances Furbish. The patient verbalized understanding and agrees to the plan for ambulatory EEG. Pt will watch for a call from [facility]. Order form complete and is pending MD signature.

## 2023-04-24 NOTE — Progress Notes (Deleted)
 [facility] [address] [place]. [place] [address] [phone]       OFFICE FOLLOW UP NOTE  [name] Date of Birth:  Jul 19, 1975 Medical Record Number:  [id number]    Primary neurologist: Dr. Frances Furbish Reason for visit: Initial CPAP follow-up    SUBJECTIVE:   CHIEF COMPLAINT:  No chief complaint on file.  Follow-up visit:  Prior visit: 11/10/2022 with Dr. Frances Furbish  Brief HPI:   Judith Bentley is a 49 y.o. female who was initially seen by Dr. Frances Furbish on 07/10/2022 for concern of underlying sleep apnea with reported snoring, excessive daytime somnolence, occasional morning headaches and witnessed apneas.  ESS 11/24. FSS 63/63.  HST 07/31/2022 showed severe OSA with total AHI of 48.1/h and O2 nadir of 76% with time below or at 88% saturation of over 50 minutes indicating nocturnal hypoxemia.  AutoPap initiated 08/21/2022.   Interval history:  CPAP compliance report shows satisfactory usage and optimal residual AHI.  Initially using FFM but difficulty tolerating.  Recently switched to nasal pillow, initial nasal pillow mask too small, received new mask last week and feels like she is tolerating her new mask better. Still has fatigue but feels this is due to not sleeping well due to mask intolerance.  Headaches have been gradually improving over the past week. ESS 10/24 (prior 11/24). FSS 60/63 (prior 63/63).             ROS:   14 system review of systems performed and negative with exception of those listed in HPI  PMH:  Past Medical History:  Diagnosis Date   Allergy    Seasonal allergies   Anemia    Anxious appearance 06/15/2022   COVID-19    Cyclical vomiting syndrome    Encounter for therapeutic drug monitoring 09/17/2022   GERD (gastroesophageal reflux disease)    Hyperlipidemia 8/22?   Hypertension    Seizure-like activity (HCC) 11/29/2022   Sleep apnea    I have not been diagnosed, but have symptoms    PSH:  Past Surgical  History:  Procedure Laterality Date   BREAST BIOPSY Left 10/24/2022   MM LT BREAST BX W LOC DEV 1ST LESION IMAGE BX SPEC STEREO GUIDE 10/24/2022 [facility]   CESAREAN SECTION     CHOLECYSTECTOMY     TUBAL LIGATION  03/08/05    Social History:  Social History   Socioeconomic History   Marital status: Married    Spouse name: Not on file   Number of children: 6   Years of education: Not on file   Highest education level: 12th grade  Occupational History   Occupation: housewife  Tobacco Use   Smoking status: Never   Smokeless tobacco: Never   Tobacco comments:    Never smoked  Vaping Use   Vaping status: Never Used  Substance and Sexual Activity   Alcohol use: Never   Drug use: Never   Sexual activity: Yes    Birth control/protection: Other-see comments    Comment: Tubes tied and suspected menopause  Other Topics Concern   Not on file  Social History Narrative   Not on file   Social Drivers of Health   Financial Resource Strain: High Risk (10/10/2022)   Overall Financial Resource Strain (CARDIA)    Difficulty of Paying Living Expenses: Hard  Food Insecurity: Food Insecurity Present (10/10/2022)   Hunger Vital Sign    Worried About Running Out of Food in the Last Year: Sometimes true    Ran Out of Food  in the Last Year: Never true  Transportation Needs: No Transportation Needs (10/10/2022)   PRAPARE - Administrator, Civil Service (Medical): No    Lack of Transportation (Non-Medical): No  Physical Activity: Unknown (10/10/2022)   Exercise Vital Sign    Days of Exercise per Week: 0 days    Minutes of Exercise per Session: Not on file  Stress: Stress Concern Present (10/10/2022)   [employer] of Occupational Health - Occupational Stress Questionnaire    Feeling of Stress : To some extent  Social Connections: Socially Integrated (10/10/2022)   Social Connection and Isolation Panel [NHANES]    Frequency of Communication with Friends and Family: More  than three times a week    Frequency of Social Gatherings with Friends and Family: More than three times a week    Attends Religious Services: More than 4 times per year    Active Member of [employer] or Organizations: Yes    Attends Engineer, structural: More than 4 times per year    Marital Status: Married  Catering manager Violence: Not on file    Family History:  Family History  Problem Relation Age of Onset   High Cholesterol Mother    COPD Mother    Depression Mother    Hyperlipidemia Mother    Hypertension Mother    Sleep apnea Mother    ADD / ADHD Mother    Anxiety disorder Mother    Obesity Mother    Pancreatic cancer Father    Cancer Father        pancreatic   Diabetes Mellitus II Maternal Grandfather    Breast cancer Cousin        maternal first cousin    Medications:   Current Outpatient Medications on File Prior to Visit  Medication Sig Dispense Refill   ALPRAZolam (XANAX) 0.5 MG tablet Take 1-2 pills as needed on call to MRI.  May take a 3rd pill if needed. (Patient not taking: Reported on 03/20/2023) 3 tablet 0   furosemide (LASIX) 20 MG tablet Take 1 tablet (20 mg total) by mouth daily. (Patient not taking: Reported on 03/11/2023) 30 tablet 3   Multiple Vitamin (MULTIVITAMIN PO) Take by mouth.     omeprazole (PRILOSEC) 20 MG capsule Take 20 mg by mouth 2 (two) times daily before a meal.     prochlorperazine (COMPAZINE) 5 MG tablet Take 5 mg by mouth every 6 (six) hours as needed.     promethazine (PHENERGAN) 25 MG tablet Take 1 tablet (25 mg total) by mouth every 8 (eight) hours as needed for nausea or vomiting. 90 tablet 0   No current facility-administered medications on file prior to visit.    Allergies:   Allergies  Allergen Reactions   Codeine Nausea And Vomiting      OBJECTIVE:  Physical Exam  There were no vitals filed for this visit.  There is no height or weight on file to calculate BMI. No results found.   General: well  developed, well nourished, pleasant middle-age Caucasian female, seated, in no evident distress Head: head normocephalic and atraumatic.   Neck: supple with no carotid or supraclavicular bruits Cardiovascular: regular rate and rhythm, no murmurs Musculoskeletal: no deformity Skin:  no rash/petichiae Vascular:  Normal pulses all extremities   Neurologic Exam Mental Status: Awake and fully alert. Oriented to place and time. Recent and remote memory intact. Attention span, concentration and fund of knowledge appropriate. Mood and affect appropriate.  Cranial Nerves: Pupils equal,  briskly reactive to light. Extraocular movements full without nystagmus. Visual fields full to confrontation. Hearing intact. Facial sensation intact. Face, tongue, palate moves normally and symmetrically.  Motor: Normal bulk and tone. Normal strength in all tested extremity muscles Sensory.: intact to touch , pinprick , position and vibratory sensation.  Coordination: Rapid alternating movements normal in all extremities. Finger-to-nose and heel-to-shin performed accurately bilaterally. Gait and Station: Arises from chair without difficulty. Stance is normal. Gait demonstrates normal stride length and balance without use of AD.  Reflexes: 1+ and symmetric. Toes downgoing.         ASSESSMENT/PLAN: Judith Bentley is a 48 y.o. year old female with severe sleep apnea with nocturnal hypoxemia now on CPAP therapy.    OSA on CPAP : Compliance report shows satisfactory usage with optimal residual AHI.  Continue current pressure settings.  Recently received new nasal pillow mask and seems to tolerate better than FFM. Still fatigue and occasional headaches but patient feels due to not sleeping well due to prior mask intolerance. Would still like to complete ONO to ensure resolution of nocturnal hypoxemia on CPAP.  Discussed continued nightly usage with ensuring greater than 4 hours nightly for optimal benefit and per  insurance purposes.  Continue to follow with DME company for any needed supplies or CPAP related concerns     Follow up in 6 months or call earlier if needed   CC:  PCP: Garnette Gunner, MD    I spent 30 minutes of face-to-face and non-face-to-face time with patient.  This included previsit chart review, lab review, study review, order entry, electronic health record documentation, patient education regarding diagnosis of sleep apnea with review and discussion of compliance report and answered all other questions to patient's satisfaction   Ihor Austin, [facility]  [facility] [address] [place], [place] [address]  Phone [phone] Fax [phone] Note: This document was prepared with digital dictation and possible smart phrase technology. Any transcriptional errors that result from this process are unintentional.

## 2023-04-25 ENCOUNTER — Ambulatory Visit: Payer: 59 | Admitting: Adult Health

## 2023-05-06 DIAGNOSIS — R55 Syncope and collapse: Secondary | ICD-10-CM

## 2023-05-06 DIAGNOSIS — R9401 Abnormal electroencephalogram [EEG]: Secondary | ICD-10-CM

## 2023-05-06 DIAGNOSIS — R569 Unspecified convulsions: Secondary | ICD-10-CM | POA: Diagnosis not present

## 2023-05-10 ENCOUNTER — Other Ambulatory Visit: Payer: Self-pay | Admitting: Neurology

## 2023-05-10 ENCOUNTER — Encounter (INDEPENDENT_AMBULATORY_CARE_PROVIDER_SITE_OTHER): Payer: Self-pay | Admitting: Neurology

## 2023-05-10 DIAGNOSIS — R55 Syncope and collapse: Secondary | ICD-10-CM

## 2023-05-10 DIAGNOSIS — H539 Unspecified visual disturbance: Secondary | ICD-10-CM

## 2023-05-10 DIAGNOSIS — R6889 Other general symptoms and signs: Secondary | ICD-10-CM

## 2023-05-10 DIAGNOSIS — R9401 Abnormal electroencephalogram [EEG]: Secondary | ICD-10-CM

## 2023-05-10 DIAGNOSIS — R112 Nausea with vomiting, unspecified: Secondary | ICD-10-CM

## 2023-05-10 DIAGNOSIS — G4733 Obstructive sleep apnea (adult) (pediatric): Secondary | ICD-10-CM

## 2023-05-10 NOTE — Procedures (Signed)
 Clinical History:  This is a 48 y/o F who presents for evaluation of her syncope. She has intermittent spells. She doesn't feel like they are more frequent, but more intense. Previous EEG was read abnormal with intermittent right mid temporal slowing with sharply contoured waves.   INTERMITTENT MONITORING with VIDEO TECHNICAL SUMMARY:  This AVEEG was performed using equipment provided by [employer] utilizing Bluetooth ( [employer] ) amplifiers with continuous EEGT attended video collection using encrypted remote transmission via [employer] secured cellular tower network with data rates for each AVEEG performed. This is a Therapist, music AVEEG, obtained, according to the 10-20 international electrode placement system, reformatted digitally into referential and bipolar montages. Data was acquired with a minimum of 21 bipolar connections and sampled at a minimum rate of 250 cycles per second per channel, maximum rate of 450 cycles per second per channel and two channels for EKG. The entire VEEG study was recorded through cable and or radio telemetry for subsequent analysis. Specified epochs of the AVEEG data were identified at the direction of the subject by the depression of a push button by the patient. Each patients event file included data acquired two minutes prior to the push button activation and continuing until two minutes afterwards. AVEEG files were reviewed on [facility] server, Licensed Software provided by [employer] with a digital high frequency filter set at 70 Hz and a low frequency filter set at 1 Hz with a paper speed of 87mm/s resulting in 10 seconds per digital page. This entire AVEEG was reviewed by the EEG Technologist. Random time samples, random sleep samples, clips, patient initiated push button files with included patient daily diary logs, EEG Technologist pruned data was reviewed and verified for accuracy and validity by the governing reading neurologist in full  details. This AEEGV was fully compliant with all requirements for CPT 97500 for setup, patient education, take down and administered by an EEG technologist.   Long-Term EEG with Video was monitored intermittently by a qualified EEG technologist for the entirety of the recording; quality check-ins were performed at a minimum of every two hours, checking and documenting real-time data and video to assure the integrity and quality of the recording (e.g., camera position, electrode integrity and impedance), and identify the need for maintenance. For intermittent monitoring, an EEG Technologist monitored no more than 12 patients concurrently. Diagnostic video was captured at least 80% of the time during the recording.   PATIENT EVENTS:  A button press or notation was made 4 times. Patient log was reviewed with the patient at disconnect with the intent to reconcile events. PATIENT EVENT - #1 - WATCHING TV, WENT TO BATHROOM, HEADACHE. NO EVENT BUTTON PRESS. EEG SHOWS AWAKE BACKGROUND WITHOUT ICTAL CHANGES. PATIENT IS NOT ON CAMERA. (04:00)2023/05/03 17:47:[id number]   PATIENT EVENT - #2 - SITTING ON COUCH, HEADACHE. NO EVENT BUTTON PRESS. EEG SHOWS AWAKE BACKGROUND WITHOUT ICTAL CHANGES. PATIENT IS ON CAMERA. (04:00)2023/05/04 10:50:[id number]   PATIENT EVENT - #3 - SITTING ON COUCH WITH FAMILY, HEADACHE. NO EVENT BUTTON PRESS. EEG SHOWS AWAKE BACKGROUND WITHOUT ICTAL CHANGES. PATIENT IS ON CAMERA. (04:00)2023/05/05 17:55:[id number]   PATIENT EVENT - #4 - WAOKE UP FROM SLEEPING IN BED, NOT FEELING WELL, FELT LIKE I COULDN'T BREATHE WITH MY CPAP, TOOK IT OFF AND THEN AN EPISODE HAPPENED SOON AFTER. SYNCOPE, EXTREME HOT/COLD, NAUSEA, VOMITING, HEADACHE. NO EVENT BUTTON PRESS. EEG SHOWS AWAKE BACKGROUND WITHOUT ICTAL CHANGES. PATIENT IS NOT ON CAMERA. (04:00) 2023/05/05 23:35:[id number]   TECHNOLOGIST EVENTS: No clear epileptiform  activity was detected by the reviewing neurodiagnostic technologist for  further review.  TIME SAMPLES:  10-minutes of every 2 hours recorded are reviewed as random time samples.   SLEEP SAMPLES:  5-minutes of every 24 hours recorded are reviewed as random sleep samples.   AWAKE:  At maximal level of alertness, the posterior dominant background activity was continuous, reactive, low voltage rhythm of 11.5 Hz. This was symmetric, well-modulated, and attenuated with eye opening. Diffuse, symmetric, frontocentral beta range activity was present.   SLEEP:  N1 Sleep (Stage 1) was observed and characterized by the disappearance of alpha rhythm and the appearance of vertex activity.  N2 Sleep (Stage 2) was observed and characterized by vertex waves, K-complexes, and sleep spindles.  N3 (Stage 3) sleep was observed and characterized by high amplitude Delta activity of 20%.  REM sleep was observed.  EKG: There were no arrhythmias or abnormalities noted during this recording.   Impression:  This is a normal 3-day ambulatory EEG tracing. No focal abnormalities or epileptiform discharges were seen. There were no electrographic seizures noted. There were a total of 4 events as described above with no changes in EEG background. Please note a normal EEG does not exclude the diagnosis of epilepsy.    Windell Norfolk, MD [facility]

## 2023-05-21 ENCOUNTER — Telehealth: Payer: Self-pay

## 2023-05-21 NOTE — Telephone Encounter (Addendum)
 Called pt and let her know the below Results of her 3-day EEG. Pt verbalized understanding.   ----- Message from Huston Foley sent at 05/15/2023  7:44 AM EDT ----- Please call patient and advise her that her recent ambulatory 3-day EEG was reported as normal.  Given that she has reported recurrent syncopal events, I recommend that she stay well-hydrated and well rested, continue with her AutoPap consistently, follow-up closely with PCP for blood pressure and weight management.  Please also advise her to follow-up with Shanda Bumps in or around January 2026 for her 1 year sleep apnea follow-up.  Please assist with making the appointment.  Okay to do virtual visit, if she prefers.

## 2023-06-21 ENCOUNTER — Other Ambulatory Visit: Payer: Self-pay | Admitting: Family Medicine

## 2023-06-21 DIAGNOSIS — R1115 Cyclical vomiting syndrome unrelated to migraine: Secondary | ICD-10-CM

## 2023-06-21 NOTE — Telephone Encounter (Signed)
 Requesting: promethazine  (PHENERGAN ) 25 MG tablet  Last Visit: 11/27/2022 Next Visit: Visit date not found Last Refill: 03/14/2023  Please Advise

## 2023-06-27 ENCOUNTER — Encounter: Payer: Self-pay | Admitting: Family Medicine

## 2023-06-27 ENCOUNTER — Ambulatory Visit: Admitting: Family Medicine

## 2023-06-27 VITALS — BP 126/75 | HR 83 | Temp 98.5°F | Resp 20 | Wt 284.4 lb

## 2023-06-27 DIAGNOSIS — J22 Unspecified acute lower respiratory infection: Secondary | ICD-10-CM | POA: Diagnosis not present

## 2023-06-27 DIAGNOSIS — R1115 Cyclical vomiting syndrome unrelated to migraine: Secondary | ICD-10-CM

## 2023-06-27 MED ORDER — PROMETHAZINE HCL 25 MG PO TABS
25.0000 mg | ORAL_TABLET | Freq: Three times a day (TID) | ORAL | 0 refills | Status: DC | PRN
Start: 2023-06-27 — End: 2023-09-27

## 2023-06-27 MED ORDER — APREPITANT 80 & 125 MG PO TRIPAK DAY 2 & 3: ORAL | 1 refills | Status: DC

## 2023-06-27 MED ORDER — ALBUTEROL SULFATE HFA 108 (90 BASE) MCG/ACT IN AERS: 2.0000 | INHALATION_SPRAY | Freq: Four times a day (QID) | RESPIRATORY_TRACT | 0 refills | Status: DC | PRN

## 2023-06-27 NOTE — Patient Instructions (Addendum)
  VISIT SUMMARY: During today's visit, we discussed your lingering cold symptoms and your history of cyclical vomiting. We reviewed your current symptoms, including your productive cough and congestion, and your history of recurrent respiratory infections. We also talked about your recent efforts to improve your health through increased physical activity and weight loss.  YOUR PLAN: - Acute lower respiratory tract infection (AKA bronchitis): Acute bronchitis is an inflammation of the bronchial tubes in the lungs, often causing cough and mucus production. We will order a chest x-ray to check for pneumonia or other issues. You are prescribed an albuterol  inhaler to use every 4 hours as needed for breathing difficulties or cough. Over-the-counter Mucinex  DM is recommended for your cough and congestion, and Flonase  for nasal congestion. Seek emergency care if your symptoms worsen significantly.  -CYCLICAL VOMITING SYNDROME: Cyclical vomiting syndrome is a condition characterized by episodes of severe vomiting that have no apparent cause. We will refill your Phenergan  prescription and check if your insurance covers aprepitant (Emend) for potential use. We also discussed the possible use of olanzapine to prevent future episodes. Continue your weight loss efforts and monitor for any worsening of symptoms.  INSTRUCTIONS: Please follow up with us  after your chest x-ray results are available. If your symptoms worsen or you experience severe breathing difficulties or chest pain, seek emergency care immediately.  For  xray, go to:    [facility] [address] [place], [place], [place] [address] Phone: [phone]

## 2023-06-27 NOTE — Progress Notes (Signed)
 Assessment & Plan   Assessment/Plan:    Assessment and Plan Assessment & Plan Acute bronchitis Cough and congestion with yellow sputum for one week, negative COVID test. Symptoms include wheezing, rattling noises, and chest discomfort on deep breathing, without fever or significant chest pain. Symptoms have worsened recently. Recurrent respiratory infections and pneumonia history. Possible exacerbation due to dust inhalation from cutting cardboard. Differential includes pneumonia, hence consideration for chest x-ray. No current wheezing or crackles on examination. - Order chest x-ray to evaluate for pneumonia or other respiratory issues. - Prescribe albuterol  inhaler, 2 puffs every 4 hours as needed for dyspnea or cough. - Recommend over-the-counter Mucinex  DM for cough and congestion. - Advise use of Flonase  for nasal congestion and eustachian tube drainage. - Instruct to seek emergency care if symptoms worsen, such as severe dyspnea or chest pain.  Cyclical vomiting syndrome Cyclical vomiting episodes, with the most recent severe episode in March. Symptoms include syncope, unintelligible speech, sweating, chills, and prolonged vomiting. Phenergan  is used for management but is ineffective if not taken early. Discussed potential use of aprepitant and olanzapine for prophylaxis and management. Concerns about side effects of weight loss medications due to potential nausea. Discussed alternative medications like aprepitant and olanzapine for better management. - Refill Phenergan  prescription. - Check insurance coverage for aprepitant (Emend) and consider prescribing if covered. - Discuss potential use of olanzapine for prophylaxis of cyclical vomiting episodes. - Encourage continued weight loss efforts and monitor for any exacerbation of symptoms.      Medications Discontinued During This Encounter  Medication Reason   ALPRAZolam  (XANAX ) 0.5 MG tablet    furosemide  (LASIX ) 20 MG tablet     omeprazole (PRILOSEC) 20 MG capsule    promethazine  (PHENERGAN ) 25 MG tablet Reorder   buPROPion  (WELLBUTRIN  XL) 150 MG 24 hr tablet     Return if symptoms worsen or fail to improve.        Subjective:   Encounter date: 06/27/2023  Judith Bentley is a 48 y.o. female who has Syncope; Anemia; Pneumonia due to COVID-19 virus; Class 2 obesity due to excess calories with body mass index (BMI) of 38.0 to 38.9 in adult; Cyclical vomiting syndrome; Exertional dyspnea; Cough; Postviral fatigue syndrome; Chest pain; Nausea; Apnea; PVC (premature ventricular contraction); Abdominal pain, chronic, left lower quadrant; Anxiety; Class 3 severe obesity with serious comorbidity and body mass index (BMI) of 40.0 to 44.9 in adult; Obstructive sleep apnea syndrome; Adjustment disorder with depressed mood; Encounter for therapeutic drug monitoring; and Seizure-like activity (HCC) on their problem list..   She  has a past medical history of Allergy, Anemia, Anxious appearance (06/15/2022), COVID-19, Cyclical vomiting syndrome, Encounter for therapeutic drug monitoring (09/17/2022), GERD (gastroesophageal reflux disease), Hyperlipidemia (8/22?), Hypertension, Seizure-like activity (HCC) (11/29/2022), and Sleep apnea..   She presents with chief complaint of Medication Management (Rx refills request for Phenergan  25 mg.  //HM due- vaccinations ) and URI (Pt c/o of chest congestion and cough with (yellowish phlegm) 11 days. Pt used OTC medications for symptoms with possible dust exposure in haled. In home COVID test done with negative results ) .   Discussed the use of AI scribe software for clinical note transcription with the patient, who gave verbal consent to proceed.  History of Present Illness Judith Bentley is a 48 year old female with recurrent respiratory infections who presents for a medication refill and evaluation of a lingering cold.  She has been experiencing a lingering cold for about a  week, characterized by a productive  cough with yellow sputum and congestion. She has been using over-the-counter medications, and a home COVID test was negative. The symptoms began as nasal congestion and progressed to chest involvement after exposure to dust while cutting cardboard. She experiences a wet cough, occasional wheezing, and chest discomfort on deep breaths. No fever is present, but symptoms have worsened over the past few days.  She has a history of recurrent respiratory infections, including pneumonia, which she associates with dust exposure. She recalls a similar episode after home renovations that resulted in pneumonia. She has had pneumonia multiple times, including after COVID-19 infections, but has no known history of asthma.  She also has a history of cyclical vomiting, with the most recent episode in March. During this episode, she experienced severe symptoms including syncope, unintelligible speech, extreme temperature fluctuations, and prolonged vomiting. She typically uses Phenergan  at night to prevent episodes, but it is ineffective once vomiting begins. An ambulatory EEG was normal, and she avoids certain medications due to concerns about side effects.  She has been trying to improve her health by increasing physical activity, participating in a walking challenge, and losing weight. She notes feeling better with increased activity but has not resumed walking due to her current illness.       Past Surgical History:  Procedure Laterality Date   BREAST BIOPSY Left 10/24/2022   MM LT BREAST BX W LOC DEV 1ST LESION IMAGE BX SPEC STEREO GUIDE 10/24/2022 [facility]   CESAREAN SECTION     CHOLECYSTECTOMY     TUBAL LIGATION  03/08/05    Outpatient Medications Prior to Visit  Medication Sig Dispense Refill   buPROPion  (WELLBUTRIN  XL) 150 MG 24 hr tablet Take 150 mg by mouth.     esomeprazole  (NEXIUM ) 20 MG capsule      Multiple Vitamin (MULTIVITAMIN PO) Take by mouth.      prochlorperazine  (COMPAZINE ) 5 MG tablet Take 5 mg by mouth every 6 (six) hours as needed.     ALPRAZolam  (XANAX ) 0.5 MG tablet Take 1-2 pills as needed on call to MRI.  May take a 3rd pill if needed. (Patient not taking: Reported on 03/20/2023) 3 tablet 0   furosemide  (LASIX ) 20 MG tablet Take 1 tablet (20 mg total) by mouth daily. (Patient not taking: Reported on 03/11/2023) 30 tablet 3   omeprazole (PRILOSEC) 20 MG capsule Take 20 mg by mouth 2 (two) times daily before a meal.     promethazine  (PHENERGAN ) 25 MG tablet Take 1 tablet (25 mg total) by mouth every 8 (eight) hours as needed for nausea or vomiting. 90 tablet 0   No facility-administered medications prior to visit.    Family History  Problem Relation Age of Onset   High Cholesterol Mother    COPD Mother    Depression Mother    Hyperlipidemia Mother    Hypertension Mother    Sleep apnea Mother    ADD / ADHD Mother    Anxiety disorder Mother    Obesity Mother    Pancreatic cancer Father    Cancer Father        pancreatic   Diabetes Mellitus II Maternal Grandfather    Breast cancer Cousin        maternal first cousin    Social History   Socioeconomic History   Marital status: Married    Spouse name: Not on file   Number of children: 6   Years of education: Not on file   Highest education level: 12th grade  Occupational History   Occupation: housewife  Tobacco Use   Smoking status: Never   Smokeless tobacco: Never   Tobacco comments:    Never smoked  Vaping Use   Vaping status: Never Used  Substance and Sexual Activity   Alcohol use: Never   Drug use: Never   Sexual activity: Yes    Birth control/protection: Other-see comments    Comment: Tubes tied and suspected menopause  Other Topics Concern   Not on file  Social History Narrative   Not on file   Social Drivers of Health   Financial Resource Strain: Low Risk  (06/26/2023)   Overall Financial Resource Strain (CARDIA)    Difficulty of Paying Living  Expenses: Not hard at all  Food Insecurity: No Food Insecurity (06/26/2023)   Hunger Vital Sign    Worried About Running Out of Food in the Last Year: Never true    Ran Out of Food in the Last Year: Never true  Transportation Needs: No Transportation Needs (06/26/2023)   PRAPARE - Administrator, Civil Service (Medical): No    Lack of Transportation (Non-Medical): No  Physical Activity: Sufficiently Active (06/26/2023)   Exercise Vital Sign    Days of Exercise per Week: 6 days    Minutes of Exercise per Session: 90 min  Stress: Stress Concern Present (06/26/2023)   [employer] of Occupational Health - Occupational Stress Questionnaire    Feeling of Stress : To some extent  Social Connections: Socially Integrated (06/26/2023)   Social Connection and Isolation Panel [NHANES]    Frequency of Communication with Friends and Family: More than three times a week    Frequency of Social Gatherings with Friends and Family: Once a week    Attends Religious Services: More than 4 times per year    Active Member of [employer] or Organizations: Yes    Attends Engineer, structural: More than 4 times per year    Marital Status: Married  Catering manager Violence: Not on file                                                                                                  Objective:  Physical Exam: BP 126/75 (BP Location: Left Arm, Patient Position: Sitting, Cuff Size: Large)   Pulse 83   Temp 98.5 F (36.9 C) (Oral)   Resp 20   Wt 284 lb 6.4 oz (129 kg)   SpO2 96%   BMI 42.00 kg/m   Wt Readings from Last 3 Encounters:  06/27/23 284 lb 6.4 oz (129 kg)  03/20/23 290 lb 4.8 oz (131.7 kg)  03/11/23 293 lb 9.6 oz (133.2 kg)    Physical Exam MEASUREMENTS: Weight- 284. GENERAL: Alert, cooperative, well developed, no acute distress. HEENT: Normocephalic, normal oropharynx, moist mucous membranes. CHEST: Clear to auscultation bilaterally, no wheezes, rhonchi, or  crackles. CARDIOVASCULAR: Normal heart rate and rhythm, S1 and S2 normal without murmurs. ABDOMEN: Soft, non-tender, non-distended, without organomegaly, normal bowel sounds. EXTREMITIES: No cyanosis or edema. NEUROLOGICAL: Cranial nerves grossly intact, moves all extremities without gross motor or sensory deficit.  AMBULATORY EEG Result Date: 05/10/2023 Cassandra Cleveland, MD     05/10/2023  3:10 PM Clinical History: This is a 48 y/o F who presents for evaluation of her syncope. She has intermittent spells. She doesn't feel like they are more frequent, but more intense. Previous EEG was read abnormal with intermittent right mid temporal slowing with sharply contoured waves. INTERMITTENT MONITORING with VIDEO TECHNICAL SUMMARY: This AVEEG was performed using equipment provided by [employer] utilizing Bluetooth ( [employer] ) amplifiers with continuous EEGT attended video collection using encrypted remote transmission via [employer] secured cellular tower network with data rates for each AVEEG performed. This is a Therapist, music AVEEG, obtained, according to the 10-20 international electrode placement system, reformatted digitally into referential and bipolar montages. Data was acquired with a minimum of 21 bipolar connections and sampled at a minimum rate of 250 cycles per second per channel, maximum rate of 450 cycles per second per channel and two channels for EKG. The entire VEEG study was recorded through cable and or radio telemetry for subsequent analysis. Specified epochs of the AVEEG data were identified at the direction of the subject by the depression of a push button by the patient. Each patients event file included data acquired two minutes prior to the push button activation and continuing until two minutes afterwards. AVEEG files were reviewed on [facility] server, Licensed Software provided by [employer] with a digital high frequency filter set at 70 Hz and a low frequency  filter set at 1 Hz with a paper speed of 63mm/s resulting in 10 seconds per digital page. This entire AVEEG was reviewed by the EEG Technologist. Random time samples, random sleep samples, clips, patient initiated push button files with included patient daily diary logs, EEG Technologist pruned data was reviewed and verified for accuracy and validity by the governing reading neurologist in full details. This AEEGV was fully compliant with all requirements for CPT 97500 for setup, patient education, take down and administered by an EEG technologist. Long-Term EEG with Video was monitored intermittently by a qualified EEG technologist for the entirety of the recording; quality check-ins were performed at a minimum of every two hours, checking and documenting real-time data and video to assure the integrity and quality of the recording (e.g., camera position, electrode integrity and impedance), and identify the need for maintenance. For intermittent monitoring, an EEG Technologist monitored no more than 12 patients concurrently. Diagnostic video was captured at least 80% of the time during the recording. PATIENT EVENTS: A button press or notation was made 4 times. Patient log was reviewed with the patient at disconnect with the intent to reconcile events. PATIENT EVENT - #1 - WATCHING TV, WENT TO BATHROOM, HEADACHE. NO EVENT BUTTON PRESS. EEG SHOWS AWAKE BACKGROUND WITHOUT ICTAL CHANGES. PATIENT IS NOT ON CAMERA. (04:00)2023/05/03 17:47:[id number] PATIENT EVENT - #2 - SITTING ON COUCH, HEADACHE. NO EVENT BUTTON PRESS. EEG SHOWS AWAKE BACKGROUND WITHOUT ICTAL CHANGES. PATIENT IS ON CAMERA. (04:00)2023/05/04 10:50:[id number] PATIENT EVENT - #3 - SITTING ON COUCH WITH FAMILY, HEADACHE. NO EVENT BUTTON PRESS. EEG SHOWS AWAKE BACKGROUND WITHOUT ICTAL CHANGES. PATIENT IS ON CAMERA. (04:00)2023/05/05 17:55:[id number] PATIENT EVENT - #4 - WAOKE UP FROM SLEEPING IN BED, NOT FEELING WELL, FELT LIKE I COULDN'T BREATHE WITH  MY CPAP, TOOK IT OFF AND THEN AN EPISODE HAPPENED SOON AFTER. SYNCOPE, EXTREME HOT/COLD, NAUSEA, VOMITING, HEADACHE. NO EVENT BUTTON PRESS. EEG SHOWS AWAKE BACKGROUND WITHOUT ICTAL CHANGES. PATIENT IS NOT ON CAMERA. (04:00) 2023/05/05 23:35:[id number] TECHNOLOGIST EVENTS: No clear epileptiform activity was  detected by the reviewing neurodiagnostic technologist for further review. TIME SAMPLES: 10-minutes of every 2 hours recorded are reviewed as random time samples. SLEEP SAMPLES: 5-minutes of every 24 hours recorded are reviewed as random sleep samples. AWAKE: At maximal level of alertness, the posterior dominant background activity was continuous, reactive, low voltage rhythm of 11.5 Hz. This was symmetric, well-modulated, and attenuated with eye opening. Diffuse, symmetric, frontocentral beta range activity was present. SLEEP: N1 Sleep (Stage 1) was observed and characterized by the disappearance of alpha rhythm and the appearance of vertex activity. N2 Sleep (Stage 2) was observed and characterized by vertex waves, K-complexes, and sleep spindles. N3 (Stage 3) sleep was observed and characterized by high amplitude Delta activity of 20%. REM sleep was observed. EKG: There were no arrhythmias or abnormalities noted during this recording. Impression: This is a normal 3-day ambulatory EEG tracing. No focal abnormalities or epileptiform discharges were seen. There were no electrographic seizures noted. There were a total of 4 events as described above with no changes in EEG background. Please note a normal EEG does not exclude the diagnosis of epilepsy. Amadou Camara, MD [facility]   EEG adult Result Date: 04/16/2023 Cassandra Cleveland, MD     04/16/2023  3:20 PM History: 48 year old woman with recurrent syncope EEG classification: Awake and drowsy Duration: 25 minutes Technical aspects: This EEG study was done with scalp electrodes positioned according to the 10-20 International system of  electrode placement. Electrical activity was reviewed with band pass filter of 1-70Hz , sensitivity of 7 uV/mm, display speed of 56mm/sec with a 60Hz  notched filter applied as appropriate. EEG data were recorded continuously and digitally stored. Description of the recording: The background rhythms of this recording consists of a fairly well modulated medium amplitude alpha rhythm of 11 Hz that is reactive to eye opening and closure. Present in the anterior head region is a 15-20 Hz beta activity. Photic stimulation was performed, did not show any abnormalities. Hyperventilation was also performed, did not show any abnormalities. Drowsiness was manifested by background fragmentation. No definite abnormal epileptiform discharges seen during this recording. There was right mid temporal focal slowing with sharp contoured waves. There were no electrographic seizure identified. Abnormality: Intermittent right mid temporal slowing with sharply contoured wave.  Impression: This is an abnormal awake and drowsy EEG due to presence of intermittent right mid temporal slowing with sharply contoured waves. This is associated with area of neuronal dysfunction in the right mid temporal region, consider ambulatory EEG for further evaluation.  Cassandra Cleveland, MD [facility]    No results found for this or any previous visit (from the past 2160 hours).      Carnell Christian, MD, MS

## 2023-07-01 ENCOUNTER — Emergency Department (HOSPITAL_COMMUNITY)
Admission: EM | Admit: 2023-07-01 | Discharge: 2023-07-01 | Disposition: A | Discharge status: Home / Self Care | Attending: Emergency Medicine | Admitting: Emergency Medicine

## 2023-07-01 ENCOUNTER — Emergency Department (HOSPITAL_COMMUNITY)

## 2023-07-01 ENCOUNTER — Other Ambulatory Visit: Payer: Self-pay

## 2023-07-01 DIAGNOSIS — R052 Subacute cough: Secondary | ICD-10-CM | POA: Diagnosis not present

## 2023-07-01 DIAGNOSIS — I10 Essential (primary) hypertension: Secondary | ICD-10-CM | POA: Diagnosis not present

## 2023-07-01 DIAGNOSIS — Z8616 Personal history of COVID-19: Secondary | ICD-10-CM | POA: Diagnosis not present

## 2023-07-01 DIAGNOSIS — R072 Precordial pain: Secondary | ICD-10-CM | POA: Diagnosis present

## 2023-07-01 LAB — CBC WITH DIFFERENTIAL/PLATELET
Abs Immature Granulocytes: 0.04 10*3/uL (ref 0.00–0.07)
Basophils Absolute: 0.1 10*3/uL (ref 0.0–0.1)
Basophils Relative: 1 %
Eosinophils Absolute: 0.1 10*3/uL (ref 0.0–0.5)
Eosinophils Relative: 1 %
HCT: 36 % (ref 36.0–46.0)
Hemoglobin: 11.8 g/dL — ABNORMAL LOW (ref 12.0–15.0)
Immature Granulocytes: 1 %
Lymphocytes Relative: 27 %
Lymphs Abs: 1.7 10*3/uL (ref 0.7–4.0)
MCH: 27 pg (ref 26.0–34.0)
MCHC: 32.8 g/dL (ref 30.0–36.0)
MCV: 82.4 fL (ref 80.0–100.0)
Monocytes Absolute: 0.2 10*3/uL (ref 0.1–1.0)
Monocytes Relative: 4 %
Neutro Abs: 4.3 10*3/uL (ref 1.7–7.7)
Neutrophils Relative %: 66 %
Platelets: 302 10*3/uL (ref 150–400)
RBC: 4.37 MIL/uL (ref 3.87–5.11)
RDW: 14.1 % (ref 11.5–15.5)
WBC: 6.5 10*3/uL (ref 4.0–10.5)
nRBC: 0 % (ref 0.0–0.2)

## 2023-07-01 LAB — COMPREHENSIVE METABOLIC PANEL WITH GFR
ALT: 26 U/L (ref 0–44)
AST: 25 U/L (ref 15–41)
Albumin: 3.7 g/dL (ref 3.5–5.0)
Alkaline Phosphatase: 81 U/L (ref 38–126)
Anion gap: 10 (ref 5–15)
BUN: 10 mg/dL (ref 6–20)
CO2: 24 mmol/L (ref 22–32)
Calcium: 9.3 mg/dL (ref 8.9–10.3)
Chloride: 107 mmol/L (ref 98–111)
Creatinine, Ser: 0.67 mg/dL (ref 0.44–1.00)
GFR, Estimated: >60 mL/min (ref >60)
Glucose, Bld: 132 mg/dL — ABNORMAL HIGH (ref 70–99)
Potassium: 3.8 mmol/L (ref 3.5–5.1)
Sodium: 141 mmol/L (ref 135–145)
Total Bilirubin: 0.5 mg/dL (ref 0.0–1.2)
Total Protein: 7.5 g/dL (ref 6.5–8.1)

## 2023-07-01 LAB — LIPASE, BLOOD: Lipase: 30 U/L (ref 11–51)

## 2023-07-01 LAB — TROPONIN I (HIGH SENSITIVITY)
Troponin I (High Sensitivity): 4 ng/L (ref <18)
Troponin I (High Sensitivity): 4 ng/L (ref <18)

## 2023-07-01 LAB — D-DIMER, QUANTITATIVE: D-Dimer, Quant: <0.27 ug{FEU}/mL (ref 0.00–0.50)

## 2023-07-01 MED ORDER — PROMETHAZINE HCL 25 MG PO TABS
25.0000 mg | ORAL_TABLET | Freq: Once | ORAL | Status: AC
  Administered 2023-07-01: 25 mg via ORAL
  Filled 2023-07-01: qty 1

## 2023-07-01 MED ORDER — BENZONATATE 100 MG PO CAPS: 100.0000 mg | ORAL_CAPSULE | Freq: Three times a day (TID) | ORAL | 0 refills | Status: DC | PRN

## 2023-07-01 MED ORDER — ONDANSETRON HCL 4 MG/2ML IJ SOLN
4.0000 mg | Freq: Once | INTRAMUSCULAR | Status: DC
  Filled 2023-07-01: qty 2

## 2023-07-01 MED ORDER — SODIUM CHLORIDE 0.9 % IV BOLUS
500.0000 mL | Freq: Once | INTRAVENOUS | Status: AC
  Administered 2023-07-01: 500 mL via INTRAVENOUS

## 2023-07-01 NOTE — ED Notes (Signed)
 CCMD called and patient placed on monitor

## 2023-07-01 NOTE — ED Provider Notes (Signed)
 Emergency Department Provider Note   I have reviewed the triage vital signs and the nursing notes.   HISTORY  Chief Complaint Chest Pain   HPI Judith Bentley is a 48 y.o. female with past history reviewed below including cyclical vomiting, hypertension, hyperlipidemia presents to the emergency department with acute onset central chest pain with some shortness of breath.  She states she awoke suddenly feeling like someone was standing on her chest and feeling short of breath.  She had some associated more left-sided sharp pain which is intermittent without clear provoking factors.  She states over the past 2 weeks she suspected she may be suffering from pneumonia with cough and fatigue.  She has been unable to get a chest x-ray and has not started any treatment for pneumonia.  No leg pain or swelling.  No history of similar chest pain in the past.  Past Medical History:  Diagnosis Date   Allergy    Seasonal allergies   Anemia    Anxious appearance 06/15/2022   COVID-19    Cyclical vomiting syndrome    Encounter for therapeutic drug monitoring 09/17/2022   GERD (gastroesophageal reflux disease)    Hyperlipidemia 8/22?   Hypertension    Seizure-like activity (HCC) 11/29/2022   Sleep apnea    I have not been diagnosed, but have symptoms    Review of Systems  Constitutional: No fever/chills Cardiovascular: Positive chest pain. Respiratory: Positive shortness of breath. Gastrointestinal: No abdominal pain. Positive nausea, no vomiting.  Genitourinary: Negative for dysuria. Musculoskeletal: Negative for back pain. Skin: Negative for rash. Neurological: Negative for headaches.  ____________________________________________   PHYSICAL EXAM:  VITAL SIGNS: Vitals:   07/01/23 0615 07/01/23 0640  BP: (!) 141/71   Pulse: 70   Resp: 14   Temp:  98.2 F (36.8 C)  SpO2: 97%      Constitutional: Alert and oriented. Well appearing and in no acute distress. Eyes:  Conjunctivae are normal.  Head: Atraumatic. Nose: No congestion/rhinnorhea. Mouth/Throat: Mucous membranes are moist.  Neck: No stridor.  Cardiovascular: Normal rate, regular rhythm. Good peripheral circulation. Grossly normal heart sounds.   Respiratory: Normal respiratory effort.  No retractions. Lungs CTAB. Gastrointestinal: Soft and nontender. No distention.  Musculoskeletal: No lower extremity tenderness nor edema. No gross deformities of extremities. Neurologic:  Normal speech and language.  Skin:  Skin is warm, dry and intact. No rash noted.  ____________________________________________   LABS (all labs ordered are listed, but only abnormal results are displayed)  Labs Reviewed  COMPREHENSIVE METABOLIC PANEL WITH GFR - Abnormal; Notable for the following components:      Result Value   Glucose, Bld 132 (*)    All other components within normal limits  CBC WITH DIFFERENTIAL/PLATELET - Abnormal; Notable for the following components:   Hemoglobin 11.8 (*)    All other components within normal limits  LIPASE, BLOOD  D-DIMER, QUANTITATIVE  TROPONIN I (HIGH SENSITIVITY)  TROPONIN I (HIGH SENSITIVITY)   ____________________________________________  EKG   EKG Interpretation Date/Time:  Monday Jul 01 2023 02:54:29 EDT Ventricular Rate:  88 PR Interval:  135 QRS Duration:  85 QT Interval:  349 QTC Calculation: 423 R Axis:   32  Text Interpretation: Sinus rhythm Confirmed by Abby Hocking [phone]) on 07/01/2023 2:59:59 AM        ____________________________________________  RADIOLOGY  DG Chest 2 View Result Date: 07/01/2023 CLINICAL DATA:  Chest pain with nausea and vomiting. EXAM: CHEST - 2 VIEW COMPARISON:  11/09/2021 FINDINGS: The lungs are clear  without focal pneumonia, edema, pneumothorax or pleural effusion. Cardiopericardial silhouette is at upper limits of normal for size. No acute bony abnormality. Telemetry leads overlie the chest. IMPRESSION: No active  cardiopulmonary disease. Electronically Signed   By: Donnal Fusi M.D.   On: 07/01/2023 05:20    ____________________________________________   PROCEDURES  Procedure(s) performed:   Procedures  None  ____________________________________________   INITIAL IMPRESSION / ASSESSMENT AND PLAN / ED COURSE  Pertinent labs & imaging results that were available during my care of the patient were reviewed by me and considered in my medical decision making (see chart for details).   This patient is Presenting for Evaluation of CP, which does require a range of treatment options, and is a complaint that involves a high risk of morbidity and mortality.  The Differential Diagnoses includes but is not exclusive to acute coronary syndrome, aortic dissection, pulmonary embolism, cardiac tamponade, community-acquired pneumonia, pericarditis, musculoskeletal chest wall pain, etc.   Critical Interventions-    Medications  sodium chloride  0.9 % bolus 500 mL (0 mLs Intravenous Stopped 07/01/23 0352)  promethazine  (PHENERGAN ) tablet 25 mg (25 mg Oral Given 07/01/23 0347)    Reassessment after intervention: symptoms improved.   Clinical Laboratory Tests Ordered, included CBC without leukocytosis. D dimer negative. LFTs and lipase negative. Troponin normal.   Radiologic Tests Ordered, included CXR. I independently interpreted the images and agree with radiology interpretation.   Cardiac Monitor Tracing which shows NSR.    Social Determinants of Health Risk denies EtOH or drug use.   Medical Decision Making: Summary:  The patient presents emergency department with acute onset central chest pain with associated sharp component on the left.  D-dimer is normal.  Patient with a [facility] history including cyclical vomiting which may be playing a role here.  Plan for chest imaging, troponins, reassess.  No acute ischemic change on EKG.  Reevaluation with update and discussion with patient. Symptoms  improved here. Stable for discharge.   Patient's presentation is most consistent with acute presentation with potential threat to life or bodily function.   Disposition: discharge  ____________________________________________  FINAL CLINICAL IMPRESSION(S) / ED DIAGNOSES  Final diagnoses:  Precordial chest pain  Subacute cough     NEW OUTPATIENT MEDICATIONS STARTED DURING THIS VISIT:  Discharge Medication List as of 07/01/2023  6:22 AM     START taking these medications   Details  benzonatate  (TESSALON ) 100 MG capsule Take 1 capsule (100 mg total) by mouth 3 (three) times daily as needed for cough., Starting Mon 07/01/2023, Normal        Note:  This document was prepared using Dragon voice recognition software and may include unintentional dictation errors.  Abby Hocking, MD, [facility]    Garald Rhew, Shereen Dike, MD 07/02/23 [phone]

## 2023-07-01 NOTE — Discharge Instructions (Signed)

## 2023-07-01 NOTE — ED Triage Notes (Signed)
 Pt BIB [facility] from home. Pt was sleeping and woke up with sudden onset centralized CP/SOB.  Pt also c/o nausea and vomited 2x. Pt was recently diagnosed with pneumonia.

## 2023-09-20 ENCOUNTER — Other Ambulatory Visit: Payer: Self-pay | Admitting: Family Medicine

## 2023-09-20 DIAGNOSIS — R1115 Cyclical vomiting syndrome unrelated to migraine: Secondary | ICD-10-CM

## 2023-09-20 NOTE — Telephone Encounter (Signed)
 Copied from CRM [id number]. Topic: Clinical - Medication Refill >> Sep 20, 2023  2:48 PM Chiquita SQUIBB wrote: Medication: promethazine  (PHENERGAN ) 25 MG tablet [[id number]]  Has the patient contacted their pharmacy? Yes (Agent: If no, request that the patient contact the pharmacy for the refill. If patient does not wish to contact the pharmacy document the reason why and proceed with request.) (Agent: If yes, when and what did the pharmacy advise?)  This is the patient's preferred pharmacy:  [facility] [address] [place] - [address] [facility] [place] [address] Phone: [phone] Fax: [phone]  Is this the correct pharmacy for this prescription? Yes If no, delete pharmacy and type the correct one.   Has the prescription been filled recently? No  Is the patient out of the medication? No  Has the patient been seen for an appointment in the last year OR does the patient have an upcoming appointment? Yes  Can we respond through MyChart? Yes  Agent: Please be advised that Rx refills may take up to 3 business days. We ask that you follow-up with your pharmacy.

## 2023-09-27 ENCOUNTER — Other Ambulatory Visit: Payer: Self-pay

## 2023-09-27 ENCOUNTER — Ambulatory Visit: Payer: Self-pay

## 2023-09-27 DIAGNOSIS — R1115 Cyclical vomiting syndrome unrelated to migraine: Secondary | ICD-10-CM

## 2023-09-27 MED ORDER — PROMETHAZINE HCL 25 MG PO TABS: 25.0000 mg | ORAL_TABLET | Freq: Three times a day (TID) | ORAL | 0 refills | Status: DC | PRN

## 2023-09-27 MED ORDER — PROMETHAZINE HCL 25 MG PO TABS
25.0000 mg | ORAL_TABLET | Freq: Three times a day (TID) | ORAL | 0 refills | Status: AC | PRN
Start: 2023-09-27 — End: 2023-12-26

## 2023-09-27 NOTE — Addendum Note (Signed)
 Addended by: SEBASTIAN RIGHTER B on: 09/27/2023 05:00 PM   Modules accepted: Orders

## 2023-09-27 NOTE — Telephone Encounter (Signed)
 NT called to try to get her promethazine  (PHENERGAN ) 25 MG tablet [[id number]]  ENDED refilled before the weekend. I could not find any notation on why or that she needed an appointment before refill but pharmacy has told her that is what she needs.  [phone]

## 2023-09-27 NOTE — Telephone Encounter (Signed)
 FYI Only or Action Required?: Action required by provider: medication refill request. Patient requesting emergency dispense of Phenergan  while she awaits her appt on 8/11   Patient was last seen in primary care on 06/27/2023 by Sebastian Beverley NOVAK, MD.  Called Nurse Triage reporting Medication Refill.  Symptoms began today.  Interventions attempted: Prescription medications: Phenergan .  Symptoms are: gradually worsening.  Triage Disposition: Call PCP When Office is Open  Patient/caregiver understands and will follow disposition?: Yes  FYI: Patient says she was unaware that an appt was needed to refill this med. Per patient this is prescirbed for an ongoing issue and PCP aware of symptoms if not taken ahead of time. Pt was agreeable to schedule visit with a provider on 8/11, but is requesting a temporary dispense.   Copied from CRM [id number]. Topic: Clinical - Red Word Triage >> Sep 27, 2023  3:48 PM Gennette ORN wrote: Red Word that prompted transfer to Nurse Triage: Patient is calling because she has been without her medication since 09/20/23. She is nausea and is very faint. She gets like this when she hasn't had her medication. Reason for Disposition  [1] Prescription refill request for NON-ESSENTIAL medicine (i.e., no harm to patient if med not taken) AND [2] triager unable to refill per department policy  Answer Assessment - Initial Assessment Questions 1. DRUG NAME: What medicine do you need to have refilled?     Phenergam  2. REFILLS REMAINING: How many refills are remaining? Notes: The label on the medicine or pill bottle will show how many refills are remaining. If there are no refills remaining, then a renewal may be needed.     None  3. EXPIRATION DATE: What is the expiration date? Note: The label states when the prescription will expire, and thus can no longer be refilled.)     09/25/23  4. PRESCRIBER: Who prescribed it? Note: The prescribing doctor or group is responsible  for refill approvals..     Dr. Sebastian  5. PHARMACY: Have you contacted your pharmacy (drugstore)? Note: Some pharmacies will contact the doctor (or NP/PA).      N/a  6. SYMPTOMS: Do you have any symptoms?     Nausea and dizziness  7. PREGNANCY: Is there any chance that you are pregnant? When was your last menstrual period?     No  Protocols used: Medication Refill and Renewal Call-A-AH

## 2023-09-27 NOTE — Telephone Encounter (Signed)
 Forwarding message below. RX refills request. Do you approve of refill? Please advise   LOV 06/27/2023 LRF 06/27/2023 FOV NONE SCHEDULED

## 2023-09-27 NOTE — Telephone Encounter (Signed)
 Spoke to patient and informed her that RX was refilled and sent to pharmacy. Pt verbalized understanding and a thank you. Pt have upcoming appointment on 09/30/2023 with Dr. Gala.

## 2023-09-27 NOTE — Telephone Encounter (Signed)
 Forwarding message below. Please advise.

## 2023-09-27 NOTE — Telephone Encounter (Signed)
 Copied from CRM [id number]. Topic: Clinical - Prescription Issue >> Sep 27, 2023 10:59 AM Martin[place] E wrote: Reason for CRM: Patient called in on 8/1 for a refill of her promethazine  (PHENERGAN ) 25 MG tablet, and she has not heard back. She is now completely out of this medication and cannot go through the weekend without it. Callback number [phone].

## 2023-09-27 NOTE — Telephone Encounter (Unsigned)
 Copied from CRM [id number]. Topic: Clinical - Prescription Issue >> Sep 27, 2023 10:59 AM Martinique E wrote: Reason for CRM: Patient called in on 8/1 for a refill of her promethazine  (PHENERGAN ) 25 MG tablet, and she has not heard back. She is now completely out of this medication and cannot go through the weekend without it. Callback number [phone].

## 2023-09-30 ENCOUNTER — Ambulatory Visit: Admitting: Emergency Medicine

## 2023-10-11 ENCOUNTER — Ambulatory Visit: Admitting: Family Medicine

## 2023-10-22 ENCOUNTER — Ambulatory Visit: Admitting: Family Medicine

## 2023-10-22 ENCOUNTER — Encounter: Payer: Self-pay | Admitting: Family Medicine

## 2023-10-22 VITALS — BP 130/78 | HR 94 | Temp 97.0°F | Resp 18 | Wt 280.8 lb

## 2023-10-22 DIAGNOSIS — R1115 Cyclical vomiting syndrome unrelated to migraine: Secondary | ICD-10-CM | POA: Diagnosis not present

## 2023-10-22 DIAGNOSIS — R030 Elevated blood-pressure reading, without diagnosis of hypertension: Secondary | ICD-10-CM | POA: Diagnosis not present

## 2023-10-22 MED ORDER — AMITRIPTYLINE HCL 10 MG PO TABS: 10.0000 mg | ORAL_TABLET | Freq: Every day | ORAL | 11 refills | Status: DC

## 2023-10-22 MED ORDER — PROMETHAZINE HCL 25 MG RE SUPP: 25.0000 mg | Freq: Four times a day (QID) | RECTAL | 0 refills | Status: AC | PRN

## 2023-10-22 NOTE — Patient Instructions (Signed)
  VISIT SUMMARY: During your visit, we discussed the management of your cyclical vomiting syndrome and addressed your concerns about elevated blood pressure. We reviewed your recent episodes of vomiting and the potential triggers, and we talked about preventive strategies and medications. We also discussed your blood pressure readings and the importance of regular monitoring.  YOUR PLAN: -CYCLICAL VOMITING SYNDROME: Cyclical vomiting syndrome is a condition characterized by recurrent, severe episodes of nausea and vomiting. We discussed starting amitriptyline  at 10 mg at night to help prevent these episodes. You should continue taking promethazine  as needed and use the suppositories if necessary. We also recommend a gastric emptying study to rule out any motility disorders. Please discuss further prevention strategies with your GI specialist.  -ELEVATED BLOOD PRESSURE: Your blood pressure was mildly elevated at 147/99 mmHg. This can be influenced by stress and fatigue. We recommend rechecking your blood pressure when you are more relaxed and monitoring it regularly at home. Please bring your blood pressure cuff to your next appointment for calibration. We will also schedule a follow-up appointment with fasting lab work to check your cholesterol and other basic labs.  INSTRUCTIONS: Please schedule a follow-up appointment for fasting lab work to check your cholesterol and other basic labs. Additionally, consider scheduling a gastric emptying study to rule out motility disorders. Continue monitoring your blood pressure at home and bring your blood pressure cuff to your next appointment for calibration.

## 2023-10-22 NOTE — Progress Notes (Signed)
 Assessment & Plan   Assessment/Plan:    Assessment & Plan Cyclical vomiting syndrome Cyclical vomiting syndrome with nocturnal episodes triggered by fatigue and stress. Recent episodes in early August and May included chills, sweating, and syncope, causing significant distress and dehydration, often necessitating emergency room visits. Current management includes promethazine  and prochlorperazine . Aprepitant  was considered but is unaffordable. Discussed olanzapine and amitriptyline  for prevention. Amitriptyline , a cost-effective tricyclic antidepressant, affects serotonin levels in the GI tract and is a first-line preventive treatment, potentially reducing emergency visits. She is willing to try amitriptyline , starting at 10 mg at night, with titration based on tolerance and effectiveness. A gastric emptying study is suggested to rule out motility disorders like gastroparesis. - Prescribe amitriptyline  10 mg at night for prevention. - Refill promethazine  tablets and provide suppositories for rescue use. - Consider gastric emptying study to rule out motility disorders. - Discuss prevention strategies with GI specialist.  Elevated blood pressure Mildly elevated blood pressure at 147/99 mmHg. Episodic hypertension with significant spikes during stress and fatigue, notably during a previous emergency room visit. Inconsistent home blood pressure monitoring. - Recheck blood pressure after she has been sitting and is more relaxed. - Encourage regular home blood pressure monitoring and bring cuff for calibration. - Schedule follow-up appointment with fasting lab work to check cholesterol and other basic labs.     Medications Discontinued During This Encounter  Medication Reason   benzonatate  (TESSALON ) 100 MG capsule    aprepitant  (EMEND ) 80 & 125 MG MISC capsule    albuterol  (VENTOLIN  HFA) 108 (90 Base) MCG/ACT inhaler     Return in about 1 month (around 11/21/2023) for physical (fasting  labs), vomiting, blood pressure.        Subjective:   Encounter date: 10/22/2023  Judith Bentley is a 48 y.o. female who has Syncope; Anemia; Pneumonia due to COVID-19 virus; Class 2 obesity due to excess calories with body mass index (BMI) of 38.0 to 38.9 in adult; Cyclical vomiting syndrome; Exertional dyspnea; Cough; Postviral fatigue syndrome; Chest pain; Nausea; Apnea; PVC (premature ventricular contraction); Abdominal pain, chronic, left lower quadrant; Anxiety; Class 3 severe obesity with serious comorbidity and body mass index (BMI) of 40.0 to 44.9 in adult; Obstructive sleep apnea syndrome; Adjustment disorder with depressed mood; Encounter for therapeutic drug monitoring; Seizure-like activity (HCC); and Elevated blood pressure reading on their problem list..   She  has a past medical history of Allergy, Anemia, Anxious appearance (06/15/2022), COVID-19, Cyclical vomiting syndrome, Encounter for therapeutic drug monitoring (09/17/2022), GERD (gastroesophageal reflux disease), Hyperlipidemia (8/22?), Hypertension, Seizure-like activity (HCC) (11/29/2022), and Sleep apnea..   She presents with chief complaint of Medication Refill (RX refill request for PHENERGAN  25 MG tablet//HM due- vaccinations (patient declined) ) .   Discussed the use of AI scribe software for clinical note transcription with the patient, who gave verbal consent to proceed.  History of Present Illness Judith Bentley is a 48 year old female with cyclical vomiting syndrome who presents for management of her condition.  She has experienced two episodes of vomiting since her last visit. In early August, she vomited once after taking Phenergan  early. In May, she also vomited once but avoided continuous vomiting due to early medication intervention. Episodes often occur when she is overtired, worn out, or has not had enough sleep, predominantly at night, sometimes waking her from sleep. She experiences chills,  followed by sweating and temperature fluctuations, often after passing out and vomiting.  She has had syncopal episodes and has seen cardiologists, neurologists,  and gastroenterologists, with no seizures detected on EEG. She had an episode while wearing an ambulatory EEG, but no button presses were recorded. She has used promethazine  suppositories in the past but often forgets about them. She takes Phenergan  once before bed, sometimes skipping it if she feels well, but ensures to take it when feeling extra tired.  She reports that she has not had a gastric emptying study. Her episodes are described as random, often triggered by stress and fatigue. She is concerned that her symptoms might be heart-related due to palpitations and heart racing during episodes, despite previous cardiac evaluations.  She reports a history of vomiting related to nerves and early school bus schedules during middle school, which resolved over time. She associates the onset of her current symptoms with a COVID-19 infection, noting an increase in frequency since then. She often requires emergency room visits due to prolonged vomiting leading to dehydration.  Her current medications include promethazine , which she takes once daily at night, and she has promethazine  suppositories available. She has considered other preventive medications but cost has been a barrier.  She reports a history of elevated blood pressure, with a recent reading of 147/99 mmHg. She recalls an episode in May where her blood pressure was extremely high, leading to an emergency room visit. She occasionally checks her blood pressure at home, noting occasional elevations.     ROS  Past Surgical History:  Procedure Laterality Date   BREAST BIOPSY Left 10/24/2022   MM LT BREAST BX W LOC DEV 1ST LESION IMAGE BX SPEC STEREO GUIDE 10/24/2022 [facility]   CESAREAN SECTION     CHOLECYSTECTOMY     TUBAL LIGATION  03/08/05    Outpatient Medications  Prior to Visit  Medication Sig Dispense Refill   esomeprazole  (NEXIUM ) 20 MG capsule      Multiple Vitamin (MULTIVITAMIN PO) Take by mouth.     prochlorperazine  (COMPAZINE ) 5 MG tablet Take 5 mg by mouth every 6 (six) hours as needed.     promethazine  (PHENERGAN ) 25 MG tablet Take 1 tablet (25 mg total) by mouth every 8 (eight) hours as needed for nausea or vomiting. 90 tablet 0   albuterol  (VENTOLIN  HFA) 108 (90 Base) MCG/ACT inhaler Inhale 2 puffs into the lungs every 6 (six) hours as needed for wheezing or shortness of breath. 8 g 0   aprepitant  (EMEND ) 80 & 125 MG MISC capsule Take 125 mg, 80 mg, then 80 mg orally on 3 consecutive days with the first dose taken as early in the prodrome and before onset of vomiting 3 capsule 1   benzonatate  (TESSALON ) 100 MG capsule Take 1 capsule (100 mg total) by mouth 3 (three) times daily as needed for cough. 21 capsule 0   No facility-administered medications prior to visit.    Family History  Problem Relation Age of Onset   High Cholesterol Mother    COPD Mother    Depression Mother    Hyperlipidemia Mother    Hypertension Mother    Sleep apnea Mother    ADD / ADHD Mother    Anxiety disorder Mother    Obesity Mother    Pancreatic cancer Father    Cancer Father        pancreatic   Diabetes Mellitus II Maternal Grandfather    Breast cancer Cousin        maternal first cousin    Social History   Socioeconomic History   Marital status: Married    Spouse name: Not  on file   Number of children: 6   Years of education: Not on file   Highest education level: 12th grade  Occupational History   Occupation: housewife  Tobacco Use   Smoking status: Never   Smokeless tobacco: Never   Tobacco comments:    Never smoked  Vaping Use   Vaping status: Never Used  Substance and Sexual Activity   Alcohol use: Never   Drug use: Never   Sexual activity: Yes    Birth control/protection: Other-see comments    Comment: Tubes tied and suspected  menopause  Other Topics Concern   Not on file  Social History Narrative   Not on file   Social Drivers of Health   Financial Resource Strain: Low Risk  (10/21/2023)   Overall Financial Resource Strain (CARDIA)    Difficulty of Paying Living Expenses: Not very hard  Food Insecurity: No Food Insecurity (10/21/2023)   Hunger Vital Sign    Worried About Running Out of Food in the Last Year: Never true    Ran Out of Food in the Last Year: Never true  Transportation Needs: No Transportation Needs (10/21/2023)   PRAPARE - Administrator, Civil Service (Medical): No    Lack of Transportation (Non-Medical): No  Physical Activity: Sufficiently Active (10/21/2023)   Exercise Vital Sign    Days of Exercise per Week: 5 days    Minutes of Exercise per Session: 90 min  Stress: No Stress Concern Present (10/21/2023)   [employer] of Occupational Health - Occupational Stress Questionnaire    Feeling of Stress: Only a little  Social Connections: Socially Integrated (10/21/2023)   Social Connection and Isolation Panel    Frequency of Communication with Friends and Family: More than three times a week    Frequency of Social Gatherings with Friends and Family: More than three times a week    Attends Religious Services: More than 4 times per year    Active Member of [employer] or Organizations: Yes    Attends Engineer, structural: More than 4 times per year    Marital Status: Married  Catering manager Violence: Not on file                                                                                                  Objective:  Physical Exam: BP (!) 147/99 (BP Location: Right Arm, Patient Position: Sitting, Cuff Size: Large) Comment: recheck  Pulse 94   Temp (!) 97 F (36.1 C) (Temporal)   Resp 18   Wt 280 lb 12.8 oz (127.4 kg)   SpO2 96%   BMI 41.47 kg/m    Physical Exam VITALS: BP- 147/99 GENERAL: Alert, cooperative, well developed, no acute distress HEENT:  Normocephalic, normal oropharynx, moist mucous membranes CHEST: Clear to auscultation bilaterally, no wheezes, rhonchi, or crackles CARDIOVASCULAR: Normal heart rate and rhythm, S1 and S2 normal without murmurs ABDOMEN: Soft, non-tender, non-distended, without organomegaly, normal bowel sounds EXTREMITIES: No cyanosis or edema NEUROLOGICAL: Cranial nerves grossly intact, moves all extremities without gross motor or sensory deficit   Physical Exam  No  results found.  No results found for this or any previous visit (from the past 2160 hours).      Beverley Adine Hummer, MD, MS

## 2023-10-30 ENCOUNTER — Other Ambulatory Visit: Payer: Self-pay | Admitting: Family Medicine

## 2023-10-30 DIAGNOSIS — Z1231 Encounter for screening mammogram for malignant neoplasm of breast: Secondary | ICD-10-CM

## 2023-11-04 ENCOUNTER — Ambulatory Visit
Admission: RE | Admit: 2023-11-04 | Discharge: 2023-11-04 | Disposition: A | Discharge status: Home / Self Care | Payer: 59 | Source: Ambulatory Visit | Attending: Family Medicine | Admitting: Family Medicine

## 2023-11-04 DIAGNOSIS — Z1231 Encounter for screening mammogram for malignant neoplasm of breast: Secondary | ICD-10-CM

## 2023-12-03 ENCOUNTER — Encounter: Payer: Self-pay | Admitting: Family Medicine

## 2023-12-03 ENCOUNTER — Ambulatory Visit: Admitting: Family Medicine

## 2023-12-03 VITALS — BP 140/84 | HR 82 | Temp 97.0°F | Resp 18 | Wt 279.0 lb

## 2023-12-03 DIAGNOSIS — G4733 Obstructive sleep apnea (adult) (pediatric): Secondary | ICD-10-CM | POA: Diagnosis not present

## 2023-12-03 DIAGNOSIS — Z0001 Encounter for general adult medical examination with abnormal findings: Secondary | ICD-10-CM | POA: Diagnosis not present

## 2023-12-03 DIAGNOSIS — Z6841 Body Mass Index (BMI) 40.0 and over, adult: Secondary | ICD-10-CM

## 2023-12-03 DIAGNOSIS — I10 Essential (primary) hypertension: Secondary | ICD-10-CM | POA: Diagnosis not present

## 2023-12-03 DIAGNOSIS — E66813 Obesity, class 3: Secondary | ICD-10-CM | POA: Diagnosis not present

## 2023-12-03 DIAGNOSIS — Z1159 Encounter for screening for other viral diseases: Secondary | ICD-10-CM

## 2023-12-03 DIAGNOSIS — R1115 Cyclical vomiting syndrome unrelated to migraine: Secondary | ICD-10-CM

## 2023-12-03 DIAGNOSIS — Z0000 Encounter for general adult medical examination without abnormal findings: Secondary | ICD-10-CM

## 2023-12-03 LAB — MICROALBUMIN / CREATININE URINE RATIO
Creatinine,U: 60.5 mg/dL
Microalb Creat Ratio: UNDETERMINED mg/g (ref 0.0–30.0)
Microalb, Ur: <0.7 mg/dL

## 2023-12-03 LAB — COMPREHENSIVE METABOLIC PANEL WITH GFR
ALT: 25 U/L (ref 0–35)
AST: 16 U/L (ref 0–37)
Albumin: 4.3 g/dL (ref 3.5–5.2)
Alkaline Phosphatase: 85 U/L (ref 39–117)
BUN: 13 mg/dL (ref 6–23)
CO2: 29 meq/L (ref 19–32)
Calcium: 9.1 mg/dL (ref 8.4–10.5)
Chloride: 104 meq/L (ref 96–112)
Creatinine, Ser: 0.55 mg/dL (ref 0.40–1.20)
GFR: 106.63 mL/min (ref >60.00)
Glucose, Bld: 88 mg/dL (ref 70–99)
Potassium: 4 meq/L (ref 3.5–5.1)
Sodium: 141 meq/L (ref 135–145)
Total Bilirubin: 0.4 mg/dL (ref 0.2–1.2)
Total Protein: 7.3 g/dL (ref 6.0–8.3)

## 2023-12-03 LAB — LIPID PANEL
Cholesterol: 255 mg/dL — ABNORMAL HIGH (ref 0–200)
HDL: 55.7 mg/dL (ref >39.00)
LDL Cholesterol: 145 mg/dL — ABNORMAL HIGH (ref 0–99)
NonHDL: 199.65
Total CHOL/HDL Ratio: 5
Triglycerides: 275 mg/dL — ABNORMAL HIGH (ref 0.0–149.0)
VLDL: 55 mg/dL — ABNORMAL HIGH (ref 0.0–40.0)

## 2023-12-03 LAB — HEMOGLOBIN A1C: Hgb A1c MFr Bld: 5.9 % (ref 4.6–6.5)

## 2023-12-03 LAB — CBC WITH DIFFERENTIAL/PLATELET
Basophils Absolute: 0 K/uL (ref 0.0–0.1)
Basophils Relative: 0.7 % (ref 0.0–3.0)
Eosinophils Absolute: 0.1 K/uL (ref 0.0–0.7)
Eosinophils Relative: 1.3 % (ref 0.0–5.0)
HCT: 37.8 % (ref 36.0–46.0)
Hemoglobin: 12.4 g/dL (ref 12.0–15.0)
Lymphocytes Relative: 39.4 % (ref 12.0–46.0)
Lymphs Abs: 2.1 K/uL (ref 0.7–4.0)
MCHC: 32.7 g/dL (ref 30.0–36.0)
MCV: 81 fl (ref 78.0–100.0)
Monocytes Absolute: 0.2 K/uL (ref 0.1–1.0)
Monocytes Relative: 4.5 % (ref 3.0–12.0)
Neutro Abs: 2.9 K/uL (ref 1.4–7.7)
Neutrophils Relative %: 54.1 % (ref 43.0–77.0)
Platelets: 298 K/uL (ref 150.0–400.0)
RBC: 4.66 Mil/uL (ref 3.87–5.11)
RDW: 15.3 % (ref 11.5–15.5)
WBC: 5.4 K/uL (ref 4.0–10.5)

## 2023-12-03 LAB — TSH: TSH: 3.53 u[IU]/mL (ref 0.35–5.50)

## 2023-12-03 MED ORDER — VALSARTAN-HYDROCHLOROTHIAZIDE 80-12.5 MG PO TABS: 1.0000 | ORAL_TABLET | Freq: Every day | ORAL | 3 refills | Status: AC

## 2023-12-03 MED ORDER — AMITRIPTYLINE HCL 25 MG PO TABS: 25.0000 mg | ORAL_TABLET | Freq: Every day | ORAL | 3 refills | Status: DC

## 2023-12-03 NOTE — Patient Instructions (Addendum)
  VISIT SUMMARY: Today, you had your annual physical exam. We discussed your cyclical vomiting syndrome, hypertension, sleep apnea, obesity, GERD, and headaches. We made some adjustments to your medications and recommended some lifestyle changes to help manage your conditions.  YOUR PLAN: HYPERTENSION: Your blood pressure is still high, and your sleep apnea might be contributing to it. -Start taking valsartan HCTZ 80/12.5 mg daily. -Recheck your kidney function in 1-2 weeks. -Monitor your blood pressure at home and bring your cuff to the next appointment for calibration.  OBSTRUCTIVE SLEEP APNEA: You are having trouble using your CPAP machine due to discomfort and disturbances during sleep. -We will discuss your CPAP usage and address issues with mask fit and comfort.  OBESITY, CLASS 3 (BMI 40+): Your BMI is over 40, which can lead to complications like fatty liver disease and high cholesterol. -We will screen for fatty liver disease with AST and ALT tests. -We will screen for high cholesterol with a lipid panel. -Follow a Mediterranean diet and aim for 150 minutes of moderate exercise per week.  CYCLICAL VOMITING SYNDROME: Your vomiting episodes have improved with amitriptyline . -Continue taking amitriptyline . -Consider increasing the dose to 25 mg to help with headaches and improve sleep.  HEADACHE: You have been experiencing more frequent headaches recently. -Consider increasing your amitriptyline  dose to 25 mg to help with headaches.  GASTROESOPHAGEAL REFLUX DISEASE (GERD): Your GERD is managed with esomeprazole . -Continue taking esomeprazole  20 mg daily.  ADULT WELLNESS VISIT: Your annual physical exam was conducted, and everything looked normal. -We will perform lab work including CBC, lipid panel, AST, ALT, creatinine, GFR, urinalysis with microalbumin creatinine ratio, hemoglobin A1c, fasting insulin, fasting blood glucose, and TSH. -We will screen for hepatitis C.

## 2023-12-03 NOTE — Progress Notes (Signed)
 Assessment & Plan   Assessment/Plan:    Assessment & Plan Cyclical vomiting syndrome Cyclical vomiting syndrome with nocturnal episodes triggered by fatigue and stress. Recent episodes in early August and May included chills, sweating, and syncope, causing significant distress and dehydration, often necessitating emergency room visits. Current management includes promethazine  and prochlorperazine . Aprepitant  was considered but is unaffordable. Discussed olanzapine and amitriptyline  for prevention. Amitriptyline , a cost-effective tricyclic antidepressant, affects serotonin levels in the GI tract and is a first-line preventive treatment, potentially reducing emergency visits. She is willing to try amitriptyline , starting at 10 mg at night, with titration based on tolerance and effectiveness. A gastric emptying study is suggested to rule out motility disorders like gastroparesis. - Prescribe amitriptyline  10 mg at night for prevention. - Refill promethazine  tablets and provide suppositories for rescue use. - Consider gastric emptying study to rule out motility disorders. - Discuss prevention strategies with GI specialist.  Elevated blood pressure Mildly elevated blood pressure at 147/99 mmHg. Episodic hypertension with significant spikes during stress and fatigue, notably during a previous emergency room visit. Inconsistent home blood pressure monitoring. - Recheck blood pressure after she has been sitting and is more relaxed. - Encourage regular home blood pressure monitoring and bring cuff for calibration. - Schedule follow-up appointment with fasting lab work to check cholesterol and other basic labs.     There are no discontinued medications.   No follow-ups on file.        Subjective:   Encounter date: 12/03/2023  Judith Bentley is a 48 y.o. female who has Syncope; Anemia; Pneumonia due to COVID-19 virus; Class 2 obesity due to excess calories with body mass index (BMI) of  38.0 to 38.9 in adult; Cyclical vomiting syndrome; Exertional dyspnea; Cough; Postviral fatigue syndrome; Chest pain; Nausea; Apnea; PVC (premature ventricular contraction); Abdominal pain, chronic, left lower quadrant; Anxiety; Class 3 severe obesity with serious comorbidity and body mass index (BMI) of 40.0 to 44.9 in adult [facility]); Obstructive sleep apnea syndrome; Adjustment disorder with depressed mood; Encounter for therapeutic drug monitoring; Seizure-like activity (HCC); and Elevated blood pressure reading on their problem list..   She  has a past medical history of Allergy, Anemia, Anxious appearance (06/15/2022), COVID-19, Cyclical vomiting syndrome, Encounter for therapeutic drug monitoring (09/17/2022), GERD (gastroesophageal reflux disease), Hyperlipidemia (8/22?), Hypertension, Seizure-like activity (HCC) (11/29/2022), and Sleep apnea..   She presents with chief complaint of Annual Exam (Pt is fasting today//HM due- vaccinations (Pt declined) ) and Emesis (Pt stated symptoms have improved; no episodes occurred) .   Discussed the use of AI scribe software for clinical note transcription with the patient, who gave verbal consent to proceed.  History of Present Illness Judith Bentley is a 48 year old female with cyclical vomiting syndrome who presents for management of her condition.  She has experienced two episodes of vomiting since her last visit. In early August, she vomited once after taking Phenergan  early. In May, she also vomited once but avoided continuous vomiting due to early medication intervention. Episodes often occur when she is overtired, worn out, or has not had enough sleep, predominantly at night, sometimes waking her from sleep. She experiences chills, followed by sweating and temperature fluctuations, often after passing out and vomiting.  She has had syncopal episodes and has seen cardiologists, neurologists, and gastroenterologists, with no seizures detected on EEG.  She had an episode while wearing an ambulatory EEG, but no button presses were recorded. She has used promethazine  suppositories in the past but often forgets about them. She  takes Phenergan  once before bed, sometimes skipping it if she feels well, but ensures to take it when feeling extra tired.  She reports that she has not had a gastric emptying study. Her episodes are described as random, often triggered by stress and fatigue. She is concerned that her symptoms might be heart-related due to palpitations and heart racing during episodes, despite previous cardiac evaluations.  She reports a history of vomiting related to nerves and early school bus schedules during middle school, which resolved over time. She associates the onset of her current symptoms with a COVID-19 infection, noting an increase in frequency since then. She often requires emergency room visits due to prolonged vomiting leading to dehydration.  Her current medications include promethazine , which she takes once daily at night, and she has promethazine  suppositories available. She has considered other preventive medications but cost has been a barrier.  She reports a history of elevated blood pressure, with a recent reading of 147/99 mmHg. She recalls an episode in May where her blood pressure was extremely high, leading to an emergency room visit. She occasionally checks her blood pressure at home, noting occasional elevations.   SABRAphq  ROS  Past Surgical History:  Procedure Laterality Date   BREAST BIOPSY Left 10/24/2022   MM LT BREAST BX W LOC DEV 1ST LESION IMAGE BX SPEC STEREO GUIDE 10/24/2022 [facility]   CESAREAN SECTION     CHOLECYSTECTOMY     TUBAL LIGATION  03/08/05    Outpatient Medications Prior to Visit  Medication Sig Dispense Refill   amitriptyline  (ELAVIL ) 10 MG tablet Take 1 tablet (10 mg total) by mouth at bedtime. 30 tablet 11   esomeprazole  (NEXIUM ) 20 MG capsule      Multiple Vitamin  (MULTIVITAMIN PO) Take by mouth.     prochlorperazine  (COMPAZINE ) 5 MG tablet Take 5 mg by mouth every 6 (six) hours as needed.     promethazine  (PHENERGAN ) 25 MG suppository Place 1 suppository (25 mg total) rectally every 6 (six) hours as needed for nausea or vomiting. (Patient not taking: Reported on 12/03/2023) 12 each 0   promethazine  (PHENERGAN ) 25 MG tablet Take 1 tablet (25 mg total) by mouth every 8 (eight) hours as needed for nausea or vomiting. (Patient not taking: Reported on 12/03/2023) 90 tablet 0   No facility-administered medications prior to visit.    Family History  Problem Relation Age of Onset   High Cholesterol Mother    COPD Mother    Depression Mother    Hyperlipidemia Mother    Hypertension Mother    Sleep apnea Mother    ADD / ADHD Mother    Anxiety disorder Mother    Obesity Mother    Pancreatic cancer Father    Cancer Father        pancreatic   Diabetes Mellitus II Maternal Grandfather    Breast cancer Cousin        maternal first cousin    Social History   Socioeconomic History   Marital status: Married    Spouse name: Not on file   Number of children: 6   Years of education: Not on file   Highest education level: 12th grade  Occupational History   Occupation: housewife  Tobacco Use   Smoking status: Never   Smokeless tobacco: Never   Tobacco comments:    Never smoked  Vaping Use   Vaping status: Never Used  Substance and Sexual Activity   Alcohol use: Never   Drug use: Never  Sexual activity: Yes    Birth control/protection: Other-see comments    Comment: Tubes tied and suspected menopause  Other Topics Concern   Not on file  Social History Narrative   Not on file   Social Drivers of Health   Financial Resource Strain: Low Risk  (12/02/2023)   Overall Financial Resource Strain (CARDIA)    Difficulty of Paying Living Expenses: Not hard at all  Food Insecurity: No Food Insecurity (12/02/2023)   Hunger Vital Sign    Worried  About Running Out of Food in the Last Year: Never true    Ran Out of Food in the Last Year: Never true  Transportation Needs: No Transportation Needs (12/02/2023)   PRAPARE - Administrator, Civil Service (Medical): No    Lack of Transportation (Non-Medical): No  Physical Activity: Sufficiently Active (12/02/2023)   Exercise Vital Sign    Days of Exercise per Week: 6 days    Minutes of Exercise per Session: 90 min  Stress: No Stress Concern Present (12/02/2023)   [employer] of Occupational Health - Occupational Stress Questionnaire    Feeling of Stress: Not at all  Social Connections: Socially Integrated (12/02/2023)   Social Connection and Isolation Panel    Frequency of Communication with Friends and Family: More than three times a week    Frequency of Social Gatherings with Friends and Family: More than three times a week    Attends Religious Services: More than 4 times per year    Active Member of [employer] or Organizations: Yes    Attends Engineer, structural: More than 4 times per year    Marital Status: Married  Catering manager Violence: Not on file                                                                                                  Objective:  Physical Exam: BP (!) 137/90 (BP Location: Left Arm, Patient Position: Sitting, Cuff Size: Large)   Pulse 82   Temp (!) 97 F (36.1 C) (Temporal)   Resp 18   Wt 279 lb (126.6 kg)   SpO2 96%   BMI 41.20 kg/m    Physical Exam VITALS: BP- 147/99 GENERAL: Alert, cooperative, well developed, no acute distress HEENT: Normocephalic, normal oropharynx, moist mucous membranes CHEST: Clear to auscultation bilaterally, no wheezes, rhonchi, or crackles CARDIOVASCULAR: Normal heart rate and rhythm, S1 and S2 normal without murmurs ABDOMEN: Soft, non-tender, non-distended, without organomegaly, normal bowel sounds EXTREMITIES: No cyanosis or edema NEUROLOGICAL: Cranial nerves grossly intact, moves all  extremities without gross motor or sensory deficit   Physical Exam  MM 3D SCREENING MAMMOGRAM BILATERAL BREAST Result Date: 11/06/2023 CLINICAL DATA:  Screening. EXAM: DIGITAL SCREENING BILATERAL MAMMOGRAM WITH TOMOSYNTHESIS AND CAD TECHNIQUE: Bilateral screening digital craniocaudal and mediolateral oblique mammograms were obtained. Bilateral screening digital breast tomosynthesis was performed. The images were evaluated with computer-aided detection. COMPARISON:  Previous exam(s). ACR Breast Density Category a: The breasts are almost entirely fatty. FINDINGS: There are no findings suspicious for malignancy. IMPRESSION: No mammographic evidence of malignancy. A result letter of this  screening mammogram will be mailed directly to the patient. RECOMMENDATION: Screening mammogram in one year. (Code:[id number]-B-01Y) BI-RADS CATEGORY  1: Negative. Electronically Signed   By: Curtistine Noble   On: 11/06/2023 11:21    No results found for this or any previous visit (from the past 2160 hours).      Beverley Adine Hummer, MD, MS

## 2023-12-03 NOTE — Progress Notes (Signed)
 Assessment  Assessment/Plan:  Assessment and Plan Assessment & Plan Hypertension Blood pressure remains elevated at 137/90 mmHg. Potential contribution of obstructive sleep apnea to hypertension. Valsartan HCTZ chosen for its dual action on blood pressure and potential kidney protection. - Start valsartan HCTZ 80/12.5 mg - Recheck kidney function in 1-2 weeks to monitor for adverse effects - Encourage home blood pressure monitoring and bring cuff to next appointment for calibration  Obstructive sleep apnea Reports difficulty using CPAP due to discomfort and disturbances during sleep. Not using CPAP regularly. Issues with mask fit and comfort may contribute to elevated blood pressure. - Discuss CPAP usage and address issues with mask fit and comfort  Obesity, class 3 (BMI 40+) BMI over 40. Potential complications include fatty liver disease and hyperlipidemia. - Screen for fatty liver disease with AST and ALT - Screen for hyperlipidemia with lipid panel - Recommend Mediterranean diet and moderate physical activity, including 150 minutes of exercise per week  Cyclical vomiting syndrome Well-controlled with amitriptyline . No recent episodes reported. Improvement in sleep and anxiety with amitriptyline . - Increase amitriptyline  dose to 25 mg at bedtime to help with headaches and improve sleep  Headache Increased frequency of headaches reported. Amitriptyline  may help reduce headache frequency. - Consider increasing amitriptyline  dose to 25 mg to help with headaches  Gastroesophageal reflux disease (GERD) Managed with esomeprazole  20 mg daily. - Continue esomeprazole  20 mg daily  Adult Wellness Visit Annual physical examination conducted. Heart, lung, and lower extremity exams are normal. No discomfort on abdominal palpation. Ears and eyes are normal. - Perform lab work including CBC, lipid panel, AST, ALT, creatinine, GFR, urinalysis with microalbumin creatinine ratio, hemoglobin  A1c, fasting insulin, fasting blood glucose, and TSH - Screen for hepatitis C     Medications Discontinued During This Encounter  Medication Reason   amitriptyline  (ELAVIL ) 10 MG tablet     Patient Counseling(The following topics were reviewed and/or handout was given):  -Nutrition: Stressed importance of moderation in sodium/caffeine intake, saturated fat and cholesterol, caloric balance, sufficient intake of fresh fruits, vegetables, and fiber.  -Stressed the importance of regular exercise.   -Substance Abuse: Discussed cessation/primary prevention of tobacco, alcohol, or other drug use; driving or other dangerous activities under the influence; availability of treatment for abuse.   -Injury prevention: Discussed safety belts, safety helmets, smoke detector, smoking near bedding or upholstery.   -Sexuality: Discussed sexually transmitted diseases, partner selection, use of condoms, avoidance of unintended pregnancy and contraceptive alternatives.   -Dental health: Discussed importance of regular tooth brushing, flossing, and dental visits.  -Health maintenance and immunizations reviewed. Please refer to Health maintenance section.  Return in about 1 month (around 01/03/2024) for BP (-Recheck your kidney function in 1-2 weeks.).        Subjective:   Encounter date: 12/03/2023  Chief Complaint  Patient presents with   Annual Exam    Pt is fasting today  HM due- vaccinations (Pt declined)    Emesis    Pt stated symptoms have improved; no episodes occurred    Discussed the use of AI scribe software for clinical note transcription with the patient, who gave verbal consent to proceed.  History of Present Illness 10/22/2023  Judith Bentley is a 48 year old female with cyclical vomiting syndrome who presents for management of her condition.   She has experienced two episodes of vomiting since her last visit. In early August, she vomited once after taking Phenergan  early. In  May, she also vomited once but avoided  continuous vomiting due to early medication intervention. Episodes often occur when she is overtired, worn out, or has not had enough sleep, predominantly at night, sometimes waking her from sleep. She experiences chills, followed by sweating and temperature fluctuations, often after passing out and vomiting.   She has had syncopal episodes and has seen cardiologists, neurologists, and gastroenterologists, with no seizures detected on EEG. She had an episode while wearing an ambulatory EEG, but no button presses were recorded. She has used promethazine  suppositories in the past but often forgets about them. She takes Phenergan  once before bed, sometimes skipping it if she feels well, but ensures to take it when feeling extra tired.   She reports that she has not had a gastric emptying study. Her episodes are described as random, often triggered by stress and fatigue. She is concerned that her symptoms might be heart-related due to palpitations and heart racing during episodes, despite previous cardiac evaluations.   She reports a history of vomiting related to nerves and early school bus schedules during middle school, which resolved over time. She associates the onset of her current symptoms with a COVID-19 infection, noting an increase in frequency since then. She often requires emergency room visits due to prolonged vomiting leading to dehydration.   Her current medications include promethazine , which she takes once daily at night, and she has promethazine  suppositories available. She has considered other preventive medications but cost has been a barrier.   She reports a history of elevated blood pressure, with a recent reading of 147/99 mmHg. She recalls an episode in May where her blood pressure was extremely high, leading to an emergency room visit. She occasionally checks her blood pressure at home, noting occasional elevations.  12/03/2023  Judith Bentley is a 48 year old female with cyclical vomiting syndrome and hypertension who presents for an annual physical exam.  Cyclical vomiting and associated symptoms - Cyclical vomiting syndrome with improvement in vomiting episodes since starting amitriptyline  - Current medications include amitriptyline , prochlorperazine , and occasional use of Phenergan  or Zofran  as needed - Increased frequency of headaches over the past few weeks - Occasional use of Tylenol  or Advil for headaches, but avoids frequent use due to concerns about liver health  Hypertension and hyperlipidemia - Hypertension with prior prescription for antihypertensive and lipid-lowering medications, but no current pharmacologic treatment - No antihypertensive or cholesterol medications taken since last cardiology visit over a year ago  Obesity and sleep disturbance - Class III obesity with BMI of 41 - Obstructive sleep apnea with difficulty tolerating CPAP due to mask discomfort and sleep disruption - Irregular CPAP use; mask causes discomfort and wakes both her and her husband - Nocturnal awakenings to use the bathroom or drink water - Perceived improvement in sleep quality, quicker sleep onset, and fewer awakenings since starting amitriptyline , despite not using CPAP  Gastroesophageal reflux disease (gerd) - GERD managed with esomeprazole  20 mg daily       12/03/2023    9:12 AM 10/22/2023    1:03 PM 06/27/2023   10:14 AM 03/20/2023   11:03 AM 09/17/2022    9:14 AM  Depression screen PHQ 2/9  Decreased Interest 0 0 0 0 3  Down, Depressed, Hopeless 0 0 0 0 1  PHQ - 2 Score 0 0 0 0 4  Altered sleeping 0   2 2  Tired, decreased energy 0   3 3  Change in appetite 0   0 0  Feeling bad or failure about yourself  0   0 0  Trouble concentrating 0   1 1  Moving slowly or fidgety/restless 0   0 1  Suicidal thoughts 0   0 0  PHQ-9 Score 0   6 11  Difficult doing work/chores Not difficult at all    Extremely dIfficult        12/03/2023    9:12 AM 03/20/2023   11:07 AM 09/17/2022    9:14 AM 06/15/2022   10:21 AM  GAD 7 : Generalized Anxiety Score  Nervous, Anxious, on Edge 0 0 1 1  Control/stop worrying 0 0 2 1  Worry too much - different things 0 0 2 1  Trouble relaxing 0 1 3 1   Restless 0 0 1 0  Easily annoyed or irritable 0 1 2 1   Afraid - awful might happen 0 0 2 2  Total GAD 7 Score 0 2 13 7   Anxiety Difficulty Not difficult at all  Extremely difficult Somewhat difficult    Health Maintenance Due  Topic Date Due   Hepatitis C Screening  Never done      PMH:  The following were reviewed and entered/updated in epic: Past Medical History:  Diagnosis Date   Allergy    Seasonal allergies   Anemia    Anxious appearance 06/15/2022   COVID-19    Cyclical vomiting syndrome    Encounter for therapeutic drug monitoring 09/17/2022   GERD (gastroesophageal reflux disease)    Hyperlipidemia 8/22?   Hypertension    Seizure-like activity (HCC) 11/29/2022   Sleep apnea    I have not been diagnosed, but have symptoms    Patient Active Problem List   Diagnosis Date Noted   Elevated blood pressure reading 10/22/2023   Seizure-like activity (HCC) 11/29/2022   Class 3 severe obesity with serious comorbidity and body mass index (BMI) of 40.0 to 44.9 in adult (HCC) 09/17/2022   Obstructive sleep apnea syndrome 09/17/2022   Adjustment disorder with depressed mood 09/17/2022   Encounter for therapeutic drug monitoring 09/17/2022   Nausea 06/15/2022   Apnea 06/15/2022   PVC (premature ventricular contraction) 06/15/2022   Abdominal pain, chronic, left lower quadrant 06/15/2022   Anxiety 06/15/2022   Chest pain 06/20/2020   Exertional dyspnea 01/06/2020   Cough 01/06/2020   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/06/2020   Class 2 obesity due to excess calories with body mass index (BMI) of 38.0 to 38.9 in adult 11/24/2019   Cyclical vomiting syndrome    Syncope 11/21/2019   Anemia 11/21/2019    Pneumonia due to COVID-19 virus 11/21/2019    Past Surgical History:  Procedure Laterality Date   BREAST BIOPSY Left 10/24/2022   MM LT BREAST BX W LOC DEV 1ST LESION IMAGE BX SPEC STEREO GUIDE 10/24/2022 [facility]   CESAREAN SECTION     CHOLECYSTECTOMY     TUBAL LIGATION  03/08/05    Family History  Problem Relation Age of Onset   High Cholesterol Mother    COPD Mother    Depression Mother    Hyperlipidemia Mother    Hypertension Mother    Sleep apnea Mother    ADD / ADHD Mother    Anxiety disorder Mother    Obesity Mother    Pancreatic cancer Father    Cancer Father        pancreatic   Diabetes Mellitus II Maternal Grandfather    Breast cancer Cousin        maternal first cousin    Medications- reviewed and  updated Outpatient Medications Prior to Visit  Medication Sig Dispense Refill   esomeprazole  (NEXIUM ) 20 MG capsule      Multiple Vitamin (MULTIVITAMIN PO) Take by mouth.     prochlorperazine  (COMPAZINE ) 5 MG tablet Take 5 mg by mouth every 6 (six) hours as needed.     amitriptyline  (ELAVIL ) 10 MG tablet Take 1 tablet (10 mg total) by mouth at bedtime. 30 tablet 11   promethazine  (PHENERGAN ) 25 MG suppository Place 1 suppository (25 mg total) rectally every 6 (six) hours as needed for nausea or vomiting. (Patient not taking: Reported on 12/03/2023) 12 each 0   promethazine  (PHENERGAN ) 25 MG tablet Take 1 tablet (25 mg total) by mouth every 8 (eight) hours as needed for nausea or vomiting. (Patient not taking: Reported on 12/03/2023) 90 tablet 0   No facility-administered medications prior to visit.    Allergies  Allergen Reactions   Codeine Nausea And Vomiting    Social History   Socioeconomic History   Marital status: Married    Spouse name: Not on file   Number of children: 6   Years of education: Not on file   Highest education level: 12th grade  Occupational History   Occupation: housewife  Tobacco Use   Smoking status: Never   Smokeless  tobacco: Never   Tobacco comments:    Never smoked  Vaping Use   Vaping status: Never Used  Substance and Sexual Activity   Alcohol use: Never   Drug use: Never   Sexual activity: Yes    Birth control/protection: Other-see comments    Comment: Tubes tied and suspected menopause  Other Topics Concern   Not on file  Social History Narrative   Not on file   Social Drivers of Health   Financial Resource Strain: Low Risk  (12/02/2023)   Overall Financial Resource Strain (CARDIA)    Difficulty of Paying Living Expenses: Not hard at all  Food Insecurity: No Food Insecurity (12/02/2023)   Hunger Vital Sign    Worried About Running Out of Food in the Last Year: Never true    Ran Out of Food in the Last Year: Never true  Transportation Needs: No Transportation Needs (12/02/2023)   PRAPARE - Administrator, Civil Service (Medical): No    Lack of Transportation (Non-Medical): No  Physical Activity: Sufficiently Active (12/02/2023)   Exercise Vital Sign    Days of Exercise per Week: 6 days    Minutes of Exercise per Session: 90 min  Stress: No Stress Concern Present (12/02/2023)   [employer] of Occupational Health - Occupational Stress Questionnaire    Feeling of Stress: Not at all  Social Connections: Socially Integrated (12/02/2023)   Social Connection and Isolation Panel    Frequency of Communication with Friends and Family: More than three times a week    Frequency of Social Gatherings with Friends and Family: More than three times a week    Attends Religious Services: More than 4 times per year    Active Member of [employer] or Organizations: Yes    Attends Engineer, structural: More than 4 times per year    Marital Status: Married           Objective:  Physical Exam: BP (!) 140/84 (BP Location: Right Arm, Patient Position: Sitting, Cuff Size: Large) Comment: recheck  Pulse 82   Temp (!) 97 F (36.1 C) (Temporal)   Resp 18   Wt 279 lb (126.6 kg)    SpO2  96%   BMI 41.20 kg/m   Body mass index is 41.2 kg/m. Wt Readings from Last 3 Encounters:  12/03/23 279 lb (126.6 kg)  10/22/23 280 lb 12.8 oz (127.4 kg)  07/01/23 284 lb (128.8 kg)    Physical Exam VITALS: BP- 137/90 MEASUREMENTS: BMI- 41.0. GENERAL: Alert, cooperative, well developed, no acute distress. HEENT: Normocephalic, normal oropharynx, moist mucous membranes. Pupils equal, round, reactive to light, extraocular movements intact. Ears normal bilaterally. CHEST: Clear to auscultation bilaterally. No wheezes, rhonchi, or crackles. CARDIOVASCULAR: Normal heart rate and rhythm, S1 and S2 normal without murmurs. ABDOMEN: Soft, non-tender, non-distended, without organomegaly. Normal bowel sounds. EXTREMITIES: No cyanosis or edema. Extremities normal. NEUROLOGICAL: Cranial nerves grossly intact, moves all extremities without gross motor or sensory deficit.  Physical Exam      Prior labs:   No results found for this or any previous visit (from the past 2160 hours).  Lab Results  Component Value Date   CHOL 272 (H) 08/10/2022   CHOL 294 (H) 10/13/2020   CHOL 213 (H) 06/21/2020   Lab Results  Component Value Date   HDL 54 08/10/2022   HDL 63 10/13/2020   HDL 61 06/21/2020   Lab Results  Component Value Date   LDLCALC 133 (H) 08/10/2022   LDLCALC 184 (H) 10/13/2020   LDLCALC 124 (H) 06/21/2020   Lab Results  Component Value Date   TRIG 467 (H) 08/10/2022   TRIG 247 (H) 10/13/2020   TRIG 141 06/21/2020   Lab Results  Component Value Date   CHOLHDL 5.0 (H) 08/10/2022   CHOLHDL 3.5 06/21/2020   No results found for: LDLDIRECT  Last metabolic panel Lab Results  Component Value Date   GLUCOSE 132 (H) 07/01/2023   NA 141 07/01/2023   K 3.8 07/01/2023   CL 107 07/01/2023   CO2 24 07/01/2023   BUN 10 07/01/2023   CREATININE 0.67 07/01/2023   GFRNONAA >60 07/01/2023   CALCIUM  9.3 07/01/2023   PHOS 3.0 11/25/2019   PROT 7.5 07/01/2023   ALBUMIN  3.7 07/01/2023   LABGLOB 2.8 12/07/2019   AGRATIO 1.4 12/07/2019   BILITOT 0.5 07/01/2023   ALKPHOS 81 07/01/2023   AST 25 07/01/2023   ALT 26 07/01/2023   ANIONGAP 10 07/01/2023    Lab Results  Component Value Date   HGBA1C 5.6 06/20/2020    Last CBC Lab Results  Component Value Date   WBC 6.5 07/01/2023   HGB 11.8 (L) 07/01/2023   HCT 36.0 07/01/2023   MCV 82.4 07/01/2023   MCH 27.0 07/01/2023   RDW 14.1 07/01/2023   PLT 302 07/01/2023    Lab Results  Component Value Date   TSH 3.330 07/18/2022    No results found for: PSA1, PSA  Last vitamin D No results found for: MARIEN BOLLS, VD25OH  Lab Results  Component Value Date   BILIRUBINUR NEGATIVE 09/13/2022   PROTEINUR NEGATIVE 09/13/2022   LEUKOCYTESUR NEGATIVE 09/13/2022    No results found for: LABMICR, MICROALBUR   At today's visit, we discussed treatment options, associated risk and benefits, and engage in counseling as needed.  Additionally the following were reviewed: Past medical records, past medical and surgical history, family and social background, as well as relevant laboratory results, imaging findings, and specialty notes, where applicable.  This message was generated using dictation software, and as a result, it may contain unintentional typos or errors.  Nevertheless, extensive effort was made to accurately convey at the pertinent aspects of the patient visit.  There may have been are other unrelated non-urgent complaints, but due to the busy schedule and the amount of time already spent with her, time does not permit to address these issues at today's visit. Another appointment may have or has been requested to review these additional issues.     Arvella Hummer, MD, MS

## 2023-12-04 ENCOUNTER — Ambulatory Visit: Payer: Self-pay | Admitting: Family Medicine

## 2023-12-04 DIAGNOSIS — R7303 Prediabetes: Secondary | ICD-10-CM

## 2023-12-04 DIAGNOSIS — E88819 Insulin resistance, unspecified: Secondary | ICD-10-CM

## 2023-12-04 LAB — URINALYSIS W MICROSCOPIC + REFLEX CULTURE
Bacteria, UA: NONE SEEN /HPF
Bilirubin Urine: NEGATIVE
Glucose, UA: NEGATIVE
Hgb urine dipstick: NEGATIVE
Hyaline Cast: NONE SEEN /LPF
Ketones, ur: NEGATIVE
Leukocyte Esterase: NEGATIVE
Nitrites, Initial: NEGATIVE
Protein, ur: NEGATIVE
RBC / HPF: NONE SEEN /HPF (ref 0–2)
Specific Gravity, Urine: 1.011 (ref 1.001–1.035)
Squamous Epithelial / HPF: NONE SEEN /HPF (ref <=5)
WBC, UA: NONE SEEN /HPF (ref 0–5)
pH: 7 (ref 5.0–8.0)

## 2023-12-04 LAB — INSULIN, RANDOM: Insulin: 19.2 u[IU]/mL — ABNORMAL HIGH

## 2023-12-04 LAB — HCV INTERPRETATION

## 2023-12-04 LAB — NO CULTURE INDICATED

## 2023-12-04 LAB — HCV AB W REFLEX TO QUANT PCR: HCV Ab: NONREACTIVE

## 2023-12-17 ENCOUNTER — Other Ambulatory Visit

## 2024-01-08 ENCOUNTER — Ambulatory Visit: Admitting: Family Medicine

## 2024-01-08 ENCOUNTER — Encounter: Payer: Self-pay | Admitting: Family Medicine

## 2024-01-08 VITALS — BP 107/70 | HR 80 | Temp 97.7°F | Ht 69.0 in | Wt 275.8 lb

## 2024-01-08 DIAGNOSIS — E782 Mixed hyperlipidemia: Secondary | ICD-10-CM

## 2024-01-08 DIAGNOSIS — G4733 Obstructive sleep apnea (adult) (pediatric): Secondary | ICD-10-CM

## 2024-01-08 DIAGNOSIS — R1115 Cyclical vomiting syndrome unrelated to migraine: Secondary | ICD-10-CM

## 2024-01-08 DIAGNOSIS — I10 Essential (primary) hypertension: Secondary | ICD-10-CM | POA: Diagnosis not present

## 2024-01-08 DIAGNOSIS — G5603 Carpal tunnel syndrome, bilateral upper limbs: Secondary | ICD-10-CM | POA: Diagnosis not present

## 2024-01-08 DIAGNOSIS — R7303 Prediabetes: Secondary | ICD-10-CM

## 2024-01-08 MED ORDER — LEVOCARNITINE 330 MG PO TABS: 330.0000 mg | ORAL_TABLET | Freq: Two times a day (BID) | ORAL | 3 refills | Status: DC

## 2024-01-08 MED ORDER — COENZYME Q10 100 MG PO CAPS: 100.0000 mg | ORAL_CAPSULE | Freq: Every day | ORAL | 3 refills | Status: DC

## 2024-01-08 NOTE — Patient Instructions (Addendum)
 It was very nice to see you today!  VISIT SUMMARY: You came in for a follow-up on your blood pressure management. Your blood pressure is well-controlled with your current medication. We also discussed your headaches, cyclical vomiting syndrome, carpal tunnel syndrome, and sleep disturbances. We reviewed your general health maintenance and provided recommendations for lifestyle modifications.  YOUR PLAN: ESSENTIAL HYPERTENSION: Your blood pressure is well-controlled with your current medication. -Continue taking valsartan HCT 80/12.5 mg daily. -Monitor your blood pressure weekly. -We have ordered renal function tests to monitor your kidney function.  CYCLICAL VOMITING SYNDROME AND HEADACHE DISORDER: Your headaches and nausea have improved since stopping amitriptyline . -Consider taking CoQ10 supplements for cyclical vomiting syndrome. -Monitor for any recurrence of headaches or nausea. -We provided information on CoQ10 supplementation.  CARPAL TUNNEL SYNDROME: You have chronic carpal tunnel syndrome with pain and burning in your hands, especially at night. -Consider using carpal tunnel sleeping braces available over-the-counter. -We can refer you to orthopedics for further management if you wish.  SLEEP DISTURBANCE: You have difficulty maintaining sleep due to CPAP noise and nocturnal pain from carpal tunnel syndrome. -Consider using carpal tunnel sleeping braces to improve sleep quality. -Monitor your sleep patterns and we can adjust your management as needed.  GENERAL HEALTH MAINTENANCE: Your cholesterol levels are elevated but do not require medication at this time. -Maintain a healthy diet and engage in regular exercise.  Return in about 3 months (around 04/09/2024) for BP, vomiting, headaches, sleep.   Take care, Arvella Hummer, MD, MS   PLEASE NOTE:  If you had any lab tests, please let us  know if you have not heard back within a few days. You may see your results on mychart  before we have a chance to review them but we will give you a call once they are reviewed by us .   If we ordered any referrals today, please let us  know if you have not heard from their office within the next week.   If you had any urgent prescriptions sent in today, please check with the pharmacy within an hour of our visit to make sure the prescription was transmitted appropriately.   Please try these tips to maintain a healthy lifestyle:  Eat at least 3 REAL meals and 1-2 snacks per day.  Aim for no more than 5 hours between eating.  If you eat breakfast, please do so within one hour of getting up.   Each meal should contain half fruits/vegetables, one quarter protein, and one quarter carbs (no bigger than a computer mouse)  Cut down on sweet beverages. This includes juice, soda, and sweet tea.   Drink at least 1 glass of water with each meal and aim for at least 8 glasses per day  Exercise at least 150 minutes every week.

## 2024-01-08 NOTE — Progress Notes (Signed)
 " Assessment & Plan   Assessment/Plan:   Assessment and Plan Assessment & Plan Essential hypertension Blood pressure is well-controlled on valsartan  HCT 80/12.5 mg daily, with readings around 107/70 mmHg. No symptoms of hypotension reported. Blood pressure has improved from previous readings in the 140s/80s-90s range. Current management is effective, and no immediate adjustments are necessary unless blood pressure drops below 90/60 mmHg. - Continue valsartan  HCT 80/12.5 mg daily. - Monitor blood pressure weekly. - Ordered renal function tests to monitor kidney function due to ARB and thiazide diuretic use.  Cyclical vomiting syndrome and headache disorder Headaches and nausea have improved since discontinuing amitriptyline . Blood pressure control may have contributed to symptom improvement. No recent episodes of nausea or vomiting. Previous treatments with amitriptyline  and Phenergan  were not well-tolerated. CoQ10 supplementation considered as a potential preventive measure for cyclical vomiting syndrome. - Consider CoQ10 supplementation for cyclical vomiting syndrome. - Monitor for recurrence of headaches or nausea. - Provided information on CoQ10 supplementation.  Carpal tunnel syndrome Chronic carpal tunnel syndrome with nocturnal pain and burning in hands, exacerbated by sleeping positions. Previous use of braces was not well-tolerated. No current interest in surgical intervention. Discussed non-surgical management options including bracing and potential referral to orthopedics for further management. - Consider using carpal tunnel sleeping braces available over-the-counter. - Offered referral to orthopedics for further management if desired.  Sleep disturbance Chronic sleep disturbance with difficulty maintaining sleep due to CPAP noise and nocturnal pain from carpal tunnel syndrome. Previous use of amitriptyline  provided some improvement in sleep quality but was discontinued due to side  effects. Discussed potential benefits of bracing for carpal tunnel syndrome to improve sleep quality. - Consider using carpal tunnel sleeping braces to improve sleep quality. - Monitor sleep patterns and adjust management as needed.  General Health Maintenance Cholesterol levels are elevated but not requiring medication. Lifestyle modifications recommended. - Encouraged healthy diet and regular exercise.      Medications Discontinued During This Encounter  Medication Reason   prochlorperazine  (COMPAZINE ) 5 MG tablet     Return in about 3 months (around 04/09/2024) for BP, vomiting, headaches, sleep.        Subjective:   Encounter date: 01/08/2024  Judith Bentley is a 48 y.o. female who has Cyclical vomiting syndrome; PVC (premature ventricular contraction); Class 3 severe obesity with serious comorbidity and body mass index (BMI) of 40.0 to 44.9 in adult [facility]); Obstructive sleep apnea syndrome; Primary hypertension; Carpal tunnel syndrome, bilateral; Prediabetes; and Moderate mixed hyperlipidemia not requiring statin therapy on their problem list..   She  has a past medical history of Allergy, Anemia, Anxious appearance (06/15/2022), COVID-19, Cyclical vomiting syndrome, Encounter for therapeutic drug monitoring (09/17/2022), GERD (gastroesophageal reflux disease), Hyperlipidemia (8/22?), Hypertension, Pneumonia due to COVID-19 virus (11/21/2019), Seizure-like activity (HCC) (11/29/2022), Sleep apnea, and Syncope (11/21/2019)..   She presents with chief complaint of Medical Management of Chronic Issues (1 mon f/u for a BP Check, wants to discuss the amitriptyline  (ELAVIL ) 25 MG table, pt states she is still getting headaches and feeling tired all the time she stopped the medication. Pt hasn't taken any medication for several weeks now ) .  Discussed the use of AI scribe software for clinical note transcription with the patient, who gave verbal consent to proceed.  History of  Present Illness Judith Bentley is a 48 year old female with hypertension who presents for follow-up on blood pressure management.  Hypertension - Currently taking valsartan  HCTZ 80/12.5 mg daily - Blood pressure improved to 107/70 mmHg  from previous readings in the 140s/80s-90s - Some days blood pressure readings are low per her daughter - Uncertain if blood pressure is too low but feels well overall  Headache and cyclical vomiting syndrome - History of headaches and cyclical vomiting syndrome - Previously treated with amitriptyline  10 mg with partial improvement - Increase to amitriptyline  25 mg resulted in persistent headaches and fatigue, leading to discontinuation several weeks ago - No nausea or need for Phenergan  since stopping amitriptyline  - No recent episodes of vomiting  Gastroesophageal reflux disease (gerd) - Taking esomeprazole  20 mg daily - No recent use of Compazine  or promethazine  for nausea  Sleep disturbance and sleep apnea - Sleep disrupted by CPAP machine due to frequent awakenings from air leaks and noise - Nocturia present  Hand pain and paresthesia - Pain and burning in hands, especially when sleeping on her side - Symptoms attributed to history of carpal tunnel syndrome - No use of braces for carpal tunnel syndrome in many years  Syncope and hypotension - History of passing out after nausea episode on a roller coaster, associated with low blood pressure - No recent episodes of syncope or vomiting     01/08/2024    2:04 PM 12/03/2023    9:12 AM 10/22/2023    1:03 PM 06/27/2023   10:14 AM 03/20/2023   11:03 AM  Depression screen PHQ 2/9  Decreased Interest 0 0 0 0 0  Down, Depressed, Hopeless 0 0 0 0 0  PHQ - 2 Score 0 0 0 0 0  Altered sleeping  0   2  Tired, decreased energy  0   3  Change in appetite  0   0  Feeling bad or failure about yourself   0   0  Trouble concentrating  0   1  Moving slowly or fidgety/restless  0   0  Suicidal thoughts   0   0  PHQ-9 Score  0    6   Difficult doing work/chores  Not difficult at all        Data saved with a previous flowsheet row definition      12/03/2023    9:12 AM 03/20/2023   11:07 AM 09/17/2022    9:14 AM 06/15/2022   10:21 AM  GAD 7 : Generalized Anxiety Score  Nervous, Anxious, on Edge 0 0 1 1  Control/stop worrying 0 0 2 1  Worry too much - different things 0 0 2 1  Trouble relaxing 0 1 3 1   Restless 0 0 1 0  Easily annoyed or irritable 0 1 2 1   Afraid - awful might happen 0 0 2 2  Total GAD 7 Score 0 2 13 7   Anxiety Difficulty Not difficult at all  Extremely difficult Somewhat difficult      ROS  Past Surgical History:  Procedure Laterality Date   BREAST BIOPSY Left 10/24/2022   MM LT BREAST BX W LOC DEV 1ST LESION IMAGE BX SPEC STEREO GUIDE 10/24/2022 [facility]   CESAREAN SECTION     CHOLECYSTECTOMY     TUBAL LIGATION  03/08/05    Current Outpatient Medications on File Prior to Visit  Medication Sig Dispense Refill   esomeprazole  (NEXIUM ) 20 MG capsule      Multiple Vitamin (MULTIVITAMIN PO) Take by mouth.     valsartan -hydrochlorothiazide  (DIOVAN -HCT) 80-12.5 MG tablet Take 1 tablet by mouth daily. 90 tablet 3   amitriptyline  (ELAVIL ) 25 MG tablet Take 1 tablet (25 mg total) by  mouth at bedtime. (Patient not taking: Reported on 01/08/2024) 90 tablet 3   promethazine  (PHENERGAN ) 25 MG suppository Place 1 suppository (25 mg total) rectally every 6 (six) hours as needed for nausea or vomiting. (Patient not taking: Reported on 12/03/2023) 12 each 0   promethazine  (PHENERGAN ) 25 MG tablet Take 1 tablet (25 mg total) by mouth every 8 (eight) hours as needed for nausea or vomiting. (Patient not taking: Reported on 12/03/2023) 90 tablet 0   No current facility-administered medications on file prior to visit.    Family History  Problem Relation Age of Onset   High Cholesterol Mother    COPD Mother    Depression Mother    Hyperlipidemia Mother    Hypertension  Mother    Sleep apnea Mother    ADD / ADHD Mother    Anxiety disorder Mother    Obesity Mother    Pancreatic cancer Father    Cancer Father        pancreatic   Diabetes Mellitus II Maternal Grandfather    Breast cancer Cousin        maternal first cousin    Social History   Socioeconomic History   Marital status: Married    Spouse name: Not on file   Number of children: 6   Years of education: Not on file   Highest education level: 12th grade  Occupational History   Occupation: housewife  Tobacco Use   Smoking status: Never   Smokeless tobacco: Never   Tobacco comments:    Never smoked  Vaping Use   Vaping status: Never Used  Substance and Sexual Activity   Alcohol use: Never   Drug use: Never   Sexual activity: Yes    Birth control/protection: Other-see comments    Comment: Tubes tied and suspected menopause  Other Topics Concern   Not on file  Social History Narrative   Not on file   Social Drivers of Health   Financial Resource Strain: Low Risk  (12/02/2023)   Overall Financial Resource Strain (CARDIA)    Difficulty of Paying Living Expenses: Not hard at all  Food Insecurity: No Food Insecurity (12/02/2023)   Hunger Vital Sign    Worried About Running Out of Food in the Last Year: Never true    Ran Out of Food in the Last Year: Never true  Transportation Needs: No Transportation Needs (12/02/2023)   PRAPARE - Administrator, Civil Service (Medical): No    Lack of Transportation (Non-Medical): No  Physical Activity: Sufficiently Active (12/02/2023)   Exercise Vital Sign    Days of Exercise per Week: 6 days    Minutes of Exercise per Session: 90 min  Stress: No Stress Concern Present (12/02/2023)   [employer] of Occupational Health - Occupational Stress Questionnaire    Feeling of Stress: Not at all  Social Connections: Socially Integrated (12/02/2023)   Social Connection and Isolation Panel    Frequency of Communication with Friends  and Family: More than three times a week    Frequency of Social Gatherings with Friends and Family: More than three times a week    Attends Religious Services: More than 4 times per year    Active Member of [employer] or Organizations: Yes    Attends Engineer, Structural: More than 4 times per year    Marital Status: Married  Catering Manager Violence: Not on file  Objective:  Physical Exam: BP 107/70   Pulse 80   Temp 97.7 F (36.5 C)   Ht 5' 9 (1.753 m)   Wt 275 lb 12.8 oz (125.1 kg)   SpO2 96%   BMI 40.73 kg/m    Physical Exam Constitutional:      General: She is not in acute distress.    Appearance: Normal appearance. She is not ill-appearing or toxic-appearing.  HENT:     Head: Normocephalic and atraumatic.     Nose: Nose normal. No congestion.  Eyes:     General: No scleral icterus.    Extraocular Movements: Extraocular movements intact.  Cardiovascular:     Rate and Rhythm: Normal rate and regular rhythm.     Pulses: Normal pulses.     Heart sounds: Normal heart sounds.  Pulmonary:     Effort: Pulmonary effort is normal. No respiratory distress.     Breath sounds: Normal breath sounds.  Abdominal:     General: Abdomen is flat. Bowel sounds are normal.     Palpations: Abdomen is soft.  Musculoskeletal:        General: Normal range of motion.  Lymphadenopathy:     Cervical: No cervical adenopathy.  Skin:    General: Skin is warm and dry.     Findings: No rash.  Neurological:     General: No focal deficit present.     Mental Status: She is alert and oriented to person, place, and time. Mental status is at baseline.  Psychiatric:        Mood and Affect: Mood normal.        Behavior: Behavior normal.        Thought Content: Thought content normal.        Judgment: Judgment normal.     MM 3D SCREENING MAMMOGRAM BILATERAL BREAST Result Date:  11/06/2023 CLINICAL DATA:  Screening. EXAM: DIGITAL SCREENING BILATERAL MAMMOGRAM WITH TOMOSYNTHESIS AND CAD TECHNIQUE: Bilateral screening digital craniocaudal and mediolateral oblique mammograms were obtained. Bilateral screening digital breast tomosynthesis was performed. The images were evaluated with computer-aided detection. COMPARISON:  Previous exam(s). ACR Breast Density Category a: The breasts are almost entirely fatty. FINDINGS: There are no findings suspicious for malignancy. IMPRESSION: No mammographic evidence of malignancy. A result letter of this screening mammogram will be mailed directly to the patient. RECOMMENDATION: Screening mammogram in one year. (Code:SM-B-01Y) BI-RADS CATEGORY  1: Negative. Electronically Signed   By: Curtistine Noble   On: 11/06/2023 11:21    Recent Results (from the past 2160 hours)  TSH     Status: None   Collection Time: 12/03/23 10:08 AM  Result Value Ref Range   TSH 3.53 0.35 - 5.50 uIU/mL  Lipid panel     Status: Abnormal   Collection Time: 12/03/23 10:08 AM  Result Value Ref Range   Cholesterol 255 (H) 0 - 200 mg/dL    Comment: ATP III Classification       Desirable:  < 200 mg/dL               Borderline High:  200 - 239 mg/dL          High:  > = 759 mg/dL   Triglycerides 724.9 (H) 0.0 - 149.0 mg/dL    Comment: Normal:  <849 mg/dLBorderline High:  150 - 199 mg/dL   HDL 44.29 >60.99 mg/dL   VLDL 44.9 (H) 0.0 - 59.9 mg/dL   LDL Cholesterol 854 (H) 0 - 99 mg/dL   Total CHOL/HDL Ratio 5  Comment:                Men          Women1/2 Average Risk     3.4          3.3Average Risk          5.0          4.42X Average Risk          9.6          7.13X Average Risk          15.0          11.0                       NonHDL 199.65     Comment: NOTE:  Non-HDL goal should be 30 mg/dL higher than patient's LDL goal (i.e. LDL goal of < 70 mg/dL, would have non-HDL goal of < 100 mg/dL)  Hemoglobin J8r     Status: None   Collection Time: 12/03/23 10:08 AM   Result Value Ref Range   Hgb A1c MFr Bld 5.9 4.6 - 6.5 %    Comment: Glycemic Control Guidelines for People with Diabetes:Non Diabetic:  <6%Goal of Therapy: <7%Additional Action Suggested:  >8%   Microalbumin / creatinine urine ratio     Status: None   Collection Time: 12/03/23 10:08 AM  Result Value Ref Range   Microalb, Ur <0.7 mg/dL   Creatinine,U 39.4 mg/dL   Microalb Creat Ratio Unable to calculate 0.0 - 30.0 mg/g    Comment: Unable to Calculate due to Microalbumin Result of <0.7 mg/dL  CBC with Differential/Platelet     Status: None   Collection Time: 12/03/23 10:08 AM  Result Value Ref Range   WBC 5.4 4.0 - 10.5 K/uL   RBC 4.66 3.87 - 5.11 Mil/uL   Hemoglobin 12.4 12.0 - 15.0 g/dL   HCT 62.1 63.9 - 53.9 %   MCV 81.0 78.0 - 100.0 fl   MCHC 32.7 30.0 - 36.0 g/dL   RDW 84.6 88.4 - 84.4 %   Platelets 298.0 150.0 - 400.0 K/uL   Neutrophils Relative % 54.1 43.0 - 77.0 %   Lymphocytes Relative 39.4 12.0 - 46.0 %   Monocytes Relative 4.5 3.0 - 12.0 %   Eosinophils Relative 1.3 0.0 - 5.0 %   Basophils Relative 0.7 0.0 - 3.0 %   Neutro Abs 2.9 1.4 - 7.7 K/uL   Lymphs Abs 2.1 0.7 - 4.0 K/uL   Monocytes Absolute 0.2 0.1 - 1.0 K/uL   Eosinophils Absolute 0.1 0.0 - 0.7 K/uL   Basophils Absolute 0.0 0.0 - 0.1 K/uL  Comprehensive metabolic panel with GFR     Status: None   Collection Time: 12/03/23 10:08 AM  Result Value Ref Range   Sodium 141 135 - 145 mEq/L   Potassium 4.0 3.5 - 5.1 mEq/L   Chloride 104 96 - 112 mEq/L   CO2 29 19 - 32 mEq/L   Glucose, Bld 88 70 - 99 mg/dL   BUN 13 6 - 23 mg/dL   Creatinine, Ser 9.44 0.40 - 1.20 mg/dL   Total Bilirubin 0.4 0.2 - 1.2 mg/dL   Alkaline Phosphatase 85 39 - 117 U/L   AST 16 0 - 37 U/L   ALT 25 0 - 35 U/L   Total Protein 7.3 6.0 - 8.3 g/dL   Albumin 4.3 3.5 - 5.2 g/dL   GFR 893.36 >39.99 mL/min  Comment: Calculated using the CKD-EPI Creatinine Equation (2021)   Calcium  9.1 8.4 - 10.5 mg/dL  Urinalysis w microscopic + reflex  cultur     Status: None   Collection Time: 12/03/23 10:08 AM   Specimen: Blood  Result Value Ref Range   Color, Urine YELLOW YELLOW   APPearance CLEAR CLEAR   Specific Gravity, Urine 1.011 1.001 - 1.035   pH 7.0 5.0 - 8.0   Glucose, UA NEGATIVE NEGATIVE   Bilirubin Urine NEGATIVE NEGATIVE   Ketones, ur NEGATIVE NEGATIVE   Hgb urine dipstick NEGATIVE NEGATIVE   Protein, ur NEGATIVE NEGATIVE   Nitrites, Initial NEGATIVE NEGATIVE   Leukocyte Esterase NEGATIVE NEGATIVE   WBC, UA NONE SEEN 0 - 5 /HPF   RBC / HPF NONE SEEN 0 - 2 /HPF   Squamous Epithelial / HPF NONE SEEN < OR = 5 /HPF   Bacteria, UA NONE SEEN NONE SEEN /HPF   Hyaline Cast NONE SEEN NONE SEEN /LPF   Note      Comment: This urine was analyzed for the presence of WBC,  RBC, bacteria, casts, and other formed elements.  Only those elements seen were reported. . .   HCV Ab w Reflex to Quant PCR     Status: None   Collection Time: 12/03/23 10:08 AM  Result Value Ref Range   HCV Ab Non Reactive Non Reactive  Insulin , random     Status: Abnormal   Collection Time: 12/03/23 10:08 AM  Result Value Ref Range   Insulin  19.2 (H) uIU/mL    Comment:       Reference Range  < or = 18.4 .       Risk:       Optimal          < or = 18.4       Moderate         NA       High             >18.4 .       Adult cardiovascular event risk category       cut points (optimal, moderate, high)       are based on Insulin  Reference Interval       studies performed at [facility]       in 2022. .   Interpretation:     Status: None   Collection Time: 12/03/23 10:08 AM  Result Value Ref Range   HCV Interp 1: Comment     Comment: Not infected with HCV unless early or acute infection is suspected (which may be delayed in an immunocompromised individual), or other evidence exists to indicate HCV infection.   REFLEXIVE URINE CULTURE     Status: None   Collection Time: 12/03/23 10:08 AM  Result Value Ref Range   Reflexve Urine  Culture      Comment: NO CULTURE INDICATED  Basic Metabolic Panel (BMET)     Status: Abnormal   Collection Time: 01/08/24  3:19 PM  Result Value Ref Range   Sodium 139 135 - 145 mEq/L   Potassium 3.5 3.5 - 5.1 mEq/L   Chloride 97 96 - 112 mEq/L   CO2 33 (H) 19 - 32 mEq/L    Comment: Elevated LDH levels may cause falsely increased CO2 results. If LDH is >2000 U/L, a positive bias of 12% is possible.   Glucose, Bld 83 70 - 99 mg/dL   BUN 13 6 - 23 mg/dL   Creatinine,  Ser 0.62 0.40 - 1.20 mg/dL   GFR 896.47 >39.99 mL/min    Comment: Calculated using the CKD-EPI Creatinine Equation (2021)   Calcium  9.5 8.4 - 10.5 mg/dL        Beverley KATHEE Hummer, MD  I,Emily Lagle,acting as a scribe for Beverley KATHEE Hummer, MD.,have documented all relevant documentation on the behalf of Beverley KATHEE Hummer, MD.  LILLETTE Beverley KATHEE Hummer, MD, have reviewed all documentation for this visit. The documentation on 01/08/2024 for the exam, diagnosis, procedures, and orders are all accurate and complete. "

## 2024-01-09 ENCOUNTER — Ambulatory Visit: Payer: Self-pay | Admitting: Family Medicine

## 2024-01-09 LAB — BASIC METABOLIC PANEL WITH GFR
BUN: 13 mg/dL (ref 6–23)
CO2: 33 meq/L — ABNORMAL HIGH (ref 19–32)
Calcium: 9.5 mg/dL (ref 8.4–10.5)
Chloride: 97 meq/L (ref 96–112)
Creatinine, Ser: 0.62 mg/dL (ref 0.40–1.20)
GFR: 103.52 mL/min (ref >60.00)
Glucose, Bld: 83 mg/dL (ref 70–99)
Potassium: 3.5 meq/L (ref 3.5–5.1)
Sodium: 139 meq/L (ref 135–145)

## 2024-03-04 ENCOUNTER — Telehealth: Payer: Self-pay | Admitting: Family Medicine

## 2024-03-04 ENCOUNTER — Ambulatory Visit: Payer: Self-pay

## 2024-03-04 NOTE — Telephone Encounter (Signed)
 Dr. Sebastian notified of message.  I tried to call patient several times and  call was not going through.

## 2024-03-04 NOTE — Telephone Encounter (Signed)
 FYI: This call has been transferred to triage nurse: the Triage Nurse. Once the result note has been entered staff can address the message at that time.  Patient called in with the following symptoms:  Red Word:dizziness , low bp, she scheduled appt with Dr Sebastian for Feb. I called pt & transferred over to NT.    Please advise at [facility] [phone]  Message is routed to Provider Pool.

## 2024-03-04 NOTE — Telephone Encounter (Signed)
 FYI Only or Action Required?: FYI only for provider: 911 called .  Patient was last seen in primary care on 01/08/2024 by Sebastian Beverley NOVAK, MD.  Called Nurse Triage reporting low blood pressure, Headache, and Dizziness.  Symptoms began yesterday.  Interventions attempted: Nothing.  Symptoms are: stable.  Triage Disposition: Call EMS 911 Now  Patient/caregiver understands and will follow disposition?: Yes                        Reason for Disposition  Extra heartbeats, irregular heart beating, or heart is beating very fast  (i.e., palpitations)  Answer Assessment - Initial Assessment Questions Spoke with [facility] , patient made appointment last  night for thompson , has low blood pressure ,low reading   103/48 95/53 since lunch yesterday nausea headache faintness near passing out   Call transferred to this RN, patient reports Just now getting out of bed. Yesterday felt fine when got up. Sick after christmas unrelated. All the sudden yesterday really faint like going to apss out naseuous feeling  , and heart beating really fast. Felt strange. Went to lie on be and  sweating really bad daughter checked blood pressure was 103/48 , kept checking every little while went down to 90 something over 53 stayed that day all day came up a bit 113/something checked again later on and started coming back down again not sure if related to blood pressure medication getting really bad headache tenderness one spot and coming back not feeling faint now , but headache coming back has been on  pain in left head hurting again , no numbness or weakness. Denies any shortness of breath or chest pain. Patient Hasn't taken any medications this morning. Only two patients children in home. Patient said can call 911 advised this writer is happy to call for her patient agreeable on this call patient speech not slurred but seemed slow in response this RN concerned and called 911      Called 911 , ambulance dispatched to patient home address. This RN stayed on line until ambulance arrived. Sending to office as FYI     1. BLOOD PRESSURE: What is your blood pressure? Did you take at least two measurements 5 minutes apart?     Hasn't not checked this morning but consistently low yesterday came on sudden 103/48 and 95/53 when checked and remained low  2. ONSET: When did you take your blood pressure?     Yesterday  3. HOW: How did you take your blood pressure? (e.g., visiting nurse, automatic home BP monitor)     Automatic  4. HISTORY: Do you have a history of low blood pressure? What is your blood pressure normally?     Hypertension per chart and patient report  5. MEDICINES: Are you taking any medicines for blood pressure? If Yes, ask: Have they been changed recently?     Diovan  has not taken this morning 6. PULSE RATE: Do you know what your pulse rate is?      Not reported but feeling heart beating very fast  7. OTHER SYMPTOMS: Have you been sick recently? Have you had a recent injury?  Unknown  Protocols used: Blood Pressure - Low-A-AH

## 2024-03-09 ENCOUNTER — Ambulatory Visit: Admitting: Family Medicine

## 2024-03-09 VITALS — BP 122/79 | Temp 98.0°F | Ht 69.0 in | Wt 276.8 lb

## 2024-03-09 DIAGNOSIS — I10 Essential (primary) hypertension: Secondary | ICD-10-CM

## 2024-03-09 DIAGNOSIS — R1115 Cyclical vomiting syndrome unrelated to migraine: Secondary | ICD-10-CM

## 2024-03-09 DIAGNOSIS — E861 Hypovolemia: Secondary | ICD-10-CM

## 2024-03-09 NOTE — Progress Notes (Signed)
 " Assessment & Plan   Assessment/Plan:    Problem List Items Addressed This Visit       Cardiovascular and Mediastinum   Primary hypertension        Assessment and Plan Assessment & Plan Primary hypertension  Hypotension Recent hypotensive episode likely secondary to illness, dehydration, and antihypertensive medication. Symptoms included dizziness, headache, and near syncope. Blood pressure readings at home were low, but current office reading is stable. Blood pressure currently stable at 122/79 mmHg without medication. - Held valsartan -hydrochlorothiazide . - Monitor blood pressure at home. - If blood pressure exceeds 140/90 mmHg, consider taking half a tablet of valsartan -hydrochlorothiazide . - If blood pressure remains high despite half-dose, resume full dose and follow up.  Chronic Cyclical Vomiting Syndrome No recent vomiting episodes, though patient experienced nausea during recent illness without actual vomiting. Previously managed with Phenergan  as needed. No current need for medication as symptoms are well-controlled. - Monitor symptoms      Medications Discontinued During This Encounter  Medication Reason   amitriptyline  (ELAVIL ) 25 MG tablet    levOCARNitine  (CARNITOR ) 330 MG tablet    Coenzyme Q10 100 MG capsule     No follow-ups on file.        Subjective:   Encounter date: 03/09/2024  Judith Bentley is a 49 y.o. female who has Cyclical vomiting syndrome; PVC (premature ventricular contraction); Class 3 severe obesity with serious comorbidity and body mass index (BMI) of 40.0 to 44.9 in adult [facility]); Obstructive sleep apnea syndrome; Primary hypertension; Carpal tunnel syndrome, bilateral; Prediabetes; and Moderate mixed hyperlipidemia not requiring statin therapy on their problem list..   She  has a past medical history of Allergy, Anemia, Anxious appearance (06/15/2022), COVID-19, Cyclical vomiting syndrome, Encounter for therapeutic drug  monitoring (09/17/2022), GERD (gastroesophageal reflux disease), Hyperlipidemia (8/22?), Hypertension, Pneumonia due to COVID-19 virus (11/21/2019), Seizure-like activity (HCC) (11/29/2022), Sleep apnea, and Syncope (11/21/2019)..   She presents with chief complaint of Acute Visit (Pt presents today for acute visit. States that her BP has been reading too low when she checked her bp at home. Pt states she was feeling really bad the other day and around lunch time she experienced vertigo and weakness.) .   Discussed the use of AI scribe software for clinical note transcription with the patient, who gave verbal consent to proceed.  History of Present Illness Judith Bentley is a 49 year old female with hypertension who presents with concerns about hypotension.  Hypotension and associated symptoms - Low blood pressure readings at home, with systolic values in the 80s and 90s and diastolic values in the 50s - Two days ago, experienced vertigo, nausea, and faintness, which have since resolved - Last Tuesday, experienced severe dizziness, headache, nausea, and sweating while preparing her husband's work items; blood pressure measured at 103/48 and later 95/53 - No vomiting, but felt drained and tired after the episode - Has not resumed blood pressure medication since the episode of hypotension - Blood pressure has been stable since discontinuing antihypertensive medication  Antihypertensive medication use - Takes valsartan -hydrochlorothiazide  80-12.5 mg daily for hypertension - Has not taken blood pressure medication since the episode of hypotension, suspecting it may have contributed to low blood pressure  Recent respiratory illness - Illness over the holidays with symptoms of bronchitis, wheezing, and severe coughing - Visited urgent care twice and diagnosed with bronchitis - Received steroids and antibiotics; experienced nausea and diarrhea from antibiotics, leading to discontinuation -  Received a steroid injection during the second urgent care visit  Hydration status - Increasing water intake using a 40-ounce cup     ROS  Past Surgical History:  Procedure Laterality Date   BREAST BIOPSY Left 10/24/2022   MM LT BREAST BX W LOC DEV 1ST LESION IMAGE BX SPEC STEREO GUIDE 10/24/2022 [facility]   CESAREAN SECTION     CHOLECYSTECTOMY     TUBAL LIGATION  03/08/05    Outpatient Medications Prior to Visit  Medication Sig Dispense Refill   esomeprazole  (NEXIUM ) 20 MG capsule      Multiple Vitamin (MULTIVITAMIN PO) Take by mouth.     valsartan -hydrochlorothiazide  (DIOVAN -HCT) 80-12.5 MG tablet Take 1 tablet by mouth daily. 90 tablet 3   promethazine  (PHENERGAN ) 25 MG suppository Place 1 suppository (25 mg total) rectally every 6 (six) hours as needed for nausea or vomiting. (Patient not taking: Reported on 12/03/2023) 12 each 0   promethazine  (PHENERGAN ) 25 MG tablet Take 1 tablet (25 mg total) by mouth every 8 (eight) hours as needed for nausea or vomiting. (Patient not taking: Reported on 12/03/2023) 90 tablet 0   amitriptyline  (ELAVIL ) 25 MG tablet Take 1 tablet (25 mg total) by mouth at bedtime. (Patient not taking: Reported on 01/08/2024) 90 tablet 3   Coenzyme Q10 100 MG capsule Take 1 capsule (100 mg total) by mouth daily. (Patient not taking: Reported on 03/09/2024) 90 capsule 3   levOCARNitine  (CARNITOR ) 330 MG tablet Take 1 tablet (330 mg total) by mouth 2 (two) times daily. (Patient not taking: Reported on 03/09/2024) 180 tablet 3   No facility-administered medications prior to visit.    Family History  Problem Relation Age of Onset   High Cholesterol Mother    COPD Mother    Depression Mother    Hyperlipidemia Mother    Hypertension Mother    Sleep apnea Mother    ADD / ADHD Mother    Anxiety disorder Mother    Obesity Mother    Pancreatic cancer Father    Cancer Father        pancreatic   Diabetes Mellitus II Maternal Grandfather    Breast cancer  Cousin        maternal first cousin    Social History   Socioeconomic History   Marital status: Married    Spouse name: Not on file   Number of children: 6   Years of education: Not on file   Highest education level: 12th grade  Occupational History   Occupation: housewife  Tobacco Use   Smoking status: Never   Smokeless tobacco: Never   Tobacco comments:    Never smoked  Vaping Use   Vaping status: Never Used  Substance and Sexual Activity   Alcohol use: Never   Drug use: Never   Sexual activity: Yes    Birth control/protection: Other-see comments    Comment: Tubes tied and suspected menopause  Other Topics Concern   Not on file  Social History Narrative   Not on file   Social Drivers of Health   Tobacco Use: Low Risk (12/03/2023)   Patient History    Smoking Tobacco Use: Never    Smokeless Tobacco Use: Never    Passive Exposure: Not on file  Financial Resource Strain: Low Risk (12/02/2023)   Overall Financial Resource Strain (CARDIA)    Difficulty of Paying Living Expenses: Not hard at all  Food Insecurity: No Food Insecurity (12/02/2023)   Epic    Worried About Radiation Protection Practitioner of Food in the Last Year: Never true  Ran Out of Food in the Last Year: Never true  Transportation Needs: No Transportation Needs (12/02/2023)   Epic    Lack of Transportation (Medical): No    Lack of Transportation (Non-Medical): No  Physical Activity: Sufficiently Active (12/02/2023)   Exercise Vital Sign    Days of Exercise per Week: 6 days    Minutes of Exercise per Session: 90 min  Stress: No Stress Concern Present (12/02/2023)   [employer] of Occupational Health - Occupational Stress Questionnaire    Feeling of Stress: Not at all  Social Connections: Socially Integrated (12/02/2023)   Social Connection and Isolation Panel    Frequency of Communication with Friends and Family: More than three times a week    Frequency of Social Gatherings with Friends and Family: More  than three times a week    Attends Religious Services: More than 4 times per year    Active Member of [employer] or Organizations: Yes    Attends Banker Meetings: More than 4 times per year    Marital Status: Married  Catering Manager Violence: Not on file  Depression (PHQ2-9): Low Risk (03/09/2024)   Depression (PHQ2-9)    PHQ-2 Score: 0  Alcohol Screen: Not on file  Housing: Low Risk (12/02/2023)   Epic    Unable to Pay for Housing in the Last Year: No    Number of Times Moved in the Last Year: 0    Homeless in the Last Year: No  Utilities: Not on file  Health Literacy: Not on file                                                                                                  Objective:  Physical Exam: BP 122/79   Temp 98 F (36.7 C)   Ht 5' 9 (1.753 m)   Wt 276 lb 12.8 oz (125.6 kg)   SpO2 95%   BMI 40.88 kg/m    Physical Exam VITALS: BP- 122/79 GENERAL: Alert, cooperative, well developed, no acute distress. HEENT: Normocephalic, normal oropharynx, moist mucous membranes. CHEST: Clear to auscultation bilaterally, no wheezes, rhonchi, or crackles. CARDIOVASCULAR: Normal heart rate and rhythm, S1 and S2 normal without murmurs. ABDOMEN: Soft, non-tender, non-distended, without organomegaly, normal bowel sounds. EXTREMITIES: No cyanosis or edema. NEUROLOGICAL: Cranial nerves grossly intact, moves all extremities without gross motor or sensory deficit.   Physical Exam  No results found.  Recent Results (from the past 2160 hours)  Basic Metabolic Panel (BMET)     Status: Abnormal   Collection Time: 01/08/24  3:19 PM  Result Value Ref Range   Sodium 139 135 - 145 mEq/L   Potassium 3.5 3.5 - 5.1 mEq/L   Chloride 97 96 - 112 mEq/L   CO2 33 (H) 19 - 32 mEq/L    Comment: Elevated LDH levels may cause falsely increased CO2 results. If LDH is >2000 U/L, a positive bias of 12% is possible.   Glucose, Bld 83 70 - 99 mg/dL   BUN 13 6 - 23 mg/dL   Creatinine,  Ser 9.37 0.40 - 1.20 mg/dL  GFR 103.52 >60.00 mL/min    Comment: Calculated using the CKD-EPI Creatinine Equation (2021)   Calcium  9.5 8.4 - 10.5 mg/dL        Beverley Adine Hummer, MD, MS "

## 2024-04-07 ENCOUNTER — Ambulatory Visit: Admitting: Family Medicine
